# Patient Record
Sex: Female | Born: 1950 | Race: White | Hispanic: No | Marital: Married | Smoking: Never smoker
Health system: Southern US, Community
[De-identification: ages and names within clinical notes are randomized; demographics above are authoritative.]

## PROBLEM LIST (undated history)

## (undated) DIAGNOSIS — K589 Irritable bowel syndrome without diarrhea: Secondary | ICD-10-CM

## (undated) DIAGNOSIS — M5090 Cervical disc disorder, unspecified, unspecified cervical region: Secondary | ICD-10-CM

## (undated) DIAGNOSIS — M545 Low back pain, unspecified: Secondary | ICD-10-CM

## (undated) DIAGNOSIS — I10 Essential (primary) hypertension: Secondary | ICD-10-CM

## (undated) DIAGNOSIS — D1800 Hemangioma unspecified site: Secondary | ICD-10-CM

## (undated) DIAGNOSIS — G894 Chronic pain syndrome: Secondary | ICD-10-CM

## (undated) DIAGNOSIS — E7800 Pure hypercholesterolemia, unspecified: Secondary | ICD-10-CM

## (undated) DIAGNOSIS — G8929 Other chronic pain: Secondary | ICD-10-CM

## (undated) DIAGNOSIS — D490 Neoplasm of unspecified behavior of digestive system: Secondary | ICD-10-CM

## (undated) DIAGNOSIS — M810 Age-related osteoporosis without current pathological fracture: Secondary | ICD-10-CM

## (undated) DIAGNOSIS — B009 Herpesviral infection, unspecified: Secondary | ICD-10-CM

## (undated) DIAGNOSIS — K644 Residual hemorrhoidal skin tags: Secondary | ICD-10-CM

## (undated) DIAGNOSIS — R200 Anesthesia of skin: Secondary | ICD-10-CM

## (undated) DIAGNOSIS — J189 Pneumonia, unspecified organism: Secondary | ICD-10-CM

## (undated) DIAGNOSIS — K5792 Diverticulitis of intestine, part unspecified, without perforation or abscess without bleeding: Secondary | ICD-10-CM

## (undated) DIAGNOSIS — F419 Anxiety disorder, unspecified: Secondary | ICD-10-CM

## (undated) DIAGNOSIS — C801 Malignant (primary) neoplasm, unspecified: Secondary | ICD-10-CM

## (undated) DIAGNOSIS — M4716 Other spondylosis with myelopathy, lumbar region: Secondary | ICD-10-CM

## (undated) DIAGNOSIS — K219 Gastro-esophageal reflux disease without esophagitis: Secondary | ICD-10-CM

## (undated) DIAGNOSIS — K5900 Constipation, unspecified: Secondary | ICD-10-CM

## (undated) DIAGNOSIS — R202 Paresthesia of skin: Secondary | ICD-10-CM

## (undated) HISTORY — DX: Irritable bowel syndrome, unspecified: K58.9

## (undated) HISTORY — DX: Age-related osteoporosis without current pathological fracture: M81.0

## (undated) HISTORY — PX: OTHER SURGICAL HISTORY: SHX169

## (undated) HISTORY — DX: Pure hypercholesterolemia, unspecified: E78.00

## (undated) HISTORY — DX: Low back pain, unspecified: M54.50

## (undated) HISTORY — DX: Other spondylosis with myelopathy, lumbar region: M47.16

## (undated) HISTORY — DX: Hemangioma unspecified site: D18.00

## (undated) HISTORY — PX: TONSILLECTOMY: SUR1361

## (undated) HISTORY — DX: Constipation, unspecified: K59.00

## (undated) HISTORY — DX: Anesthesia of skin: R20.0

## (undated) HISTORY — DX: Chronic pain syndrome: G89.4

## (undated) HISTORY — DX: Gastro-esophageal reflux disease without esophagitis: K21.9

## (undated) HISTORY — PX: APPENDECTOMY: SHX54

## (undated) HISTORY — DX: Diverticulitis of intestine, part unspecified, without perforation or abscess without bleeding: K57.92

## (undated) HISTORY — DX: Other chronic pain: G89.29

## (undated) HISTORY — PX: KNEE SURGERY: SHX244

## (undated) HISTORY — DX: Neoplasm of unspecified behavior of digestive system: D49.0

## (undated) HISTORY — DX: Anesthesia of skin: R20.2

## (undated) HISTORY — DX: Cervical disc disorder, unspecified, unspecified cervical region: M50.90

## (undated) HISTORY — PX: CAROTID ENDARTERECTOMY: SUR193

## (undated) HISTORY — PX: CHOLECYSTECTOMY: SHX55

## (undated) HISTORY — DX: Residual hemorrhoidal skin tags: K64.4

## (undated) HISTORY — PX: TOTAL ABDOMINAL HYSTERECTOMY: SHX209

---

## 2000-06-09 ENCOUNTER — Encounter: Payer: Self-pay | Admitting: Family Medicine

## 2000-06-09 ENCOUNTER — Encounter: Admission: RE | Admit: 2000-06-09 | Discharge: 2000-06-09 | Payer: Self-pay | Admitting: Family Medicine

## 2001-04-28 ENCOUNTER — Encounter: Payer: Self-pay | Admitting: Family Medicine

## 2001-04-28 ENCOUNTER — Encounter: Admission: RE | Admit: 2001-04-28 | Discharge: 2001-04-28 | Payer: Self-pay | Admitting: Family Medicine

## 2001-06-01 ENCOUNTER — Encounter: Admission: RE | Admit: 2001-06-01 | Discharge: 2001-06-01 | Payer: Self-pay | Admitting: Family Medicine

## 2001-06-01 ENCOUNTER — Encounter: Payer: Self-pay | Admitting: Family Medicine

## 2002-04-15 ENCOUNTER — Encounter: Payer: Self-pay | Admitting: Cardiology

## 2002-04-15 ENCOUNTER — Ambulatory Visit (HOSPITAL_COMMUNITY): Admission: RE | Admit: 2002-04-15 | Discharge: 2002-04-15 | Payer: Self-pay | Admitting: Cardiology

## 2002-09-24 ENCOUNTER — Encounter: Payer: Self-pay | Admitting: Family Medicine

## 2002-09-24 ENCOUNTER — Encounter: Admission: RE | Admit: 2002-09-24 | Discharge: 2002-09-24 | Payer: Self-pay | Admitting: Family Medicine

## 2003-07-01 ENCOUNTER — Encounter (INDEPENDENT_AMBULATORY_CARE_PROVIDER_SITE_OTHER): Payer: Self-pay | Admitting: Specialist

## 2003-07-01 ENCOUNTER — Ambulatory Visit (HOSPITAL_BASED_OUTPATIENT_CLINIC_OR_DEPARTMENT_OTHER): Admission: RE | Admit: 2003-07-01 | Discharge: 2003-07-01 | Payer: Self-pay | Admitting: Orthopedic Surgery

## 2003-09-26 ENCOUNTER — Encounter: Admission: RE | Admit: 2003-09-26 | Discharge: 2003-09-26 | Payer: Self-pay | Admitting: Family Medicine

## 2003-09-26 ENCOUNTER — Encounter: Payer: Self-pay | Admitting: Family Medicine

## 2004-10-02 ENCOUNTER — Ambulatory Visit (HOSPITAL_COMMUNITY): Admission: RE | Admit: 2004-10-02 | Discharge: 2004-10-02 | Payer: Self-pay | Admitting: Family Medicine

## 2005-10-03 ENCOUNTER — Ambulatory Visit (HOSPITAL_COMMUNITY): Admission: RE | Admit: 2005-10-03 | Discharge: 2005-10-03 | Payer: Self-pay | Admitting: Family Medicine

## 2006-10-06 ENCOUNTER — Ambulatory Visit (HOSPITAL_COMMUNITY): Admission: RE | Admit: 2006-10-06 | Discharge: 2006-10-06 | Payer: Self-pay | Admitting: Family Medicine

## 2007-10-08 ENCOUNTER — Ambulatory Visit (HOSPITAL_COMMUNITY): Admission: RE | Admit: 2007-10-08 | Discharge: 2007-10-08 | Payer: Self-pay | Admitting: Family Medicine

## 2008-10-26 ENCOUNTER — Ambulatory Visit (HOSPITAL_COMMUNITY): Admission: RE | Admit: 2008-10-26 | Discharge: 2008-10-26 | Payer: Self-pay | Admitting: Family Medicine

## 2009-10-30 ENCOUNTER — Ambulatory Visit (HOSPITAL_COMMUNITY): Admission: RE | Admit: 2009-10-30 | Discharge: 2009-10-30 | Payer: Self-pay | Admitting: Family Medicine

## 2009-12-02 HISTORY — PX: COLONOSCOPY: SHX174

## 2010-08-14 DIAGNOSIS — D229 Melanocytic nevi, unspecified: Secondary | ICD-10-CM

## 2010-08-14 HISTORY — DX: Melanocytic nevi, unspecified: D22.9

## 2010-08-28 DIAGNOSIS — D229 Melanocytic nevi, unspecified: Secondary | ICD-10-CM

## 2010-08-28 HISTORY — DX: Melanocytic nevi, unspecified: D22.9

## 2010-10-31 ENCOUNTER — Ambulatory Visit (HOSPITAL_COMMUNITY)
Admission: RE | Admit: 2010-10-31 | Discharge: 2010-10-31 | Payer: Self-pay | Source: Home / Self Care | Admitting: Family Medicine

## 2010-12-23 ENCOUNTER — Encounter: Payer: Self-pay | Admitting: *Deleted

## 2011-01-24 ENCOUNTER — Other Ambulatory Visit: Payer: Self-pay | Admitting: Neurosurgery

## 2011-01-25 ENCOUNTER — Other Ambulatory Visit: Payer: Self-pay | Admitting: Neurosurgery

## 2011-01-25 DIAGNOSIS — M545 Low back pain, unspecified: Secondary | ICD-10-CM

## 2011-01-26 ENCOUNTER — Ambulatory Visit
Admission: RE | Admit: 2011-01-26 | Discharge: 2011-01-26 | Disposition: A | Discharge status: Home / Self Care | Payer: BC Managed Care – PPO | Source: Ambulatory Visit | Attending: Neurosurgery | Admitting: Neurosurgery

## 2011-01-26 DIAGNOSIS — M545 Low back pain, unspecified: Secondary | ICD-10-CM

## 2011-04-19 NOTE — Procedures (Signed)
[facility]  Patient:    BRYSTOL, WASILEWSKI Visit Number: [id number] MRN: [id number]          Service Type: OUT Location: RAD Attending Physician:  Cain Sieve Dictated by:   Rozell Searing, P.A. Proc. Date: 04/15/02 Admit Date:  04/15/2002   CC:         Selinda Flavin, M.D.   Stress Test  PROCEDURE:  Adenosine stress test.  CARDIOLOGIST:  Valera Castle, M.D.  INDICATIONS:  Ms. Rensch is a 60 year old female with no prior history of heart disease, referred by Dr. Selinda Flavin for pharmacologic stress testing for evaluation of the recent development of chest discomfort.  The patient reports a several-month history of intermittent, dull upper chest discomfort, not always associated with strenuous activity.  She reports being under considerable stress lately, and has had chest discomfort during these episodes.  She denies any associated radiation to the neck, jaw, or upper extremities, dyspnea, diaphoresis, or nausea.  Her episodes typically last for several hours before resolving.  The patient cardiac risks factors are notable for dyslipidemia, for which she is on medication, but otherwise negative for hypertension, diabetes mellitus, tobacco, or a family history of coronary artery disease.  PHYSICAL EXAMINATION  NECK:  Preserved bilateral carotid pulses without bruits.  HEART:  A regular rate and rhythm (S1 and S2), with a 1/6 short systolic ejection murmur along the LSB.  LUNGS:  Clear to auscultation in all fields.  EXTREMITIES:  Preserved distal pulses with no pedal edema.  NEUROLOGIC:  Alert and oriented.  DESCRIPTION OF PROCEDURE:  Adenosine was infused over four minutes, as per protocol.  Cardiolite injected at the three-minute mark.  The patient reported no chest discomfort during the stress test, but did note some mild discomfort during recovery, which eased spontaneously.  Serial electrocardiographic tracings are notable for:   The 1.5 mm horizontal ST depression in the inferolateral leads II, III, aVF, and V4-V6.  No atrioventricular block noted.  The heart rate rose from 83 baseline to 146 maximum.  The blood pressure rose from 140/80 baseline to 122/70 final.  CONCLUSION:  Clinically negative, electrocardiographically positive stress test, secondary to inferolateral ST changes.  Perfusion images are pending. Dictated by:   Rozell Searing, P.A. Attending Physician:  Cain Sieve DD:  04/15/02 TD:  04/17/02 Job: [id number]

## 2011-04-19 NOTE — Op Note (Signed)
NAME:  Morgan Rowland, Morgan Rowland                          ACCOUNT NO.:  [id number]   MEDICAL RECORD NO.:  [id number]                   PATIENT TYPE:  AMB   LOCATION:  DSC                                  FACILITY:  [facility]   PHYSICIAN:  Harvie Junior, M.D.                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   PRINCIPAL PROCEDURE:  Excision of Morton neuroma, interdigital between the 3-  4 interspace, right foot.   SURGEON:  Harvie Junior, M.D.   ASSISTANT:  Marshia Ly, P.A.   ANESTHESIA:  General.   BRIEF HISTORY:  She is a 60 year old female with a long history of having  had pain in the right foot.  She ultimately had undergone multiple injection  procedures because of the pain and they resolved the pain completely.  Because of continued complaints of pain in the 3-4 interval and failure of  conservative care, she was taken to the operating room for Morton's neuroma  excision.   PROCEDURE:  The patient was taken to the operating room after adequate  general anesthesia was obtained.  The patient was placed on the operating  table.  The right foot was prepped and draped in the usual sterile fashion.  Following this, the leg was exsanguinated and blood pressure tourniquet  inflated to 250 mmHg.  Following this, a linear incision was made in the 4-5  interspace.  The subcutaneous tissue was dissected down to the level of the  intercarpal ligament, which was identified.  A Freer elevator was placed on  top of the neurovascular bundle and the interdigital ligament was divided at  this point.  A large neuromatous nerve tangle was identified below this  ligament, and this was grasped, retracted proximally, and came predominately  underneath the third  metatarsal.  This was divided.  The small branch from  underneath the fourth metatarsal was identified and divided.  The digital  branches down into the third and fourth toe were clearly identified and  divided distally and the entire nerve tangle was excised.  This was sent to  pathology.   Following this, the wound was copiously irrigated, suctioned dry.  The  bleeding was controlled with electrocautery.  At this point, the skin was  closed with a 4-0 nylon running suture.  A sterile compressive dressing was  applied and the patient was taken to the recovery room where she was noted  to be in satisfactory condition.  Estimated blood loss for this procedure  was less than 10 cc.  Harvie Junior, M.D.    Ranae Plumber  D:  07/01/2003  T:  07/02/2003  Job:  [id number]

## 2011-10-29 ENCOUNTER — Other Ambulatory Visit (HOSPITAL_COMMUNITY): Payer: Self-pay | Admitting: Family Medicine

## 2011-10-29 DIAGNOSIS — Z1231 Encounter for screening mammogram for malignant neoplasm of breast: Secondary | ICD-10-CM

## 2011-12-04 ENCOUNTER — Ambulatory Visit (HOSPITAL_COMMUNITY): Payer: BC Managed Care – PPO

## 2011-12-17 ENCOUNTER — Ambulatory Visit (HOSPITAL_COMMUNITY)
Admission: RE | Admit: 2011-12-17 | Discharge: 2011-12-17 | Disposition: A | Discharge status: Home / Self Care | Payer: BC Managed Care – PPO | Source: Ambulatory Visit | Attending: Family Medicine | Admitting: Family Medicine

## 2011-12-17 DIAGNOSIS — Z1231 Encounter for screening mammogram for malignant neoplasm of breast: Secondary | ICD-10-CM

## 2012-03-02 ENCOUNTER — Encounter (INDEPENDENT_AMBULATORY_CARE_PROVIDER_SITE_OTHER): Payer: Self-pay | Admitting: Internal Medicine

## 2012-03-02 ENCOUNTER — Ambulatory Visit (INDEPENDENT_AMBULATORY_CARE_PROVIDER_SITE_OTHER): Payer: BC Managed Care – PPO | Admitting: Internal Medicine

## 2012-03-02 VITALS — BP 142/80 | HR 64 | Temp 98.6°F | Ht 61.0 in | Wt 161.3 lb

## 2012-03-02 DIAGNOSIS — E7800 Pure hypercholesterolemia, unspecified: Secondary | ICD-10-CM | POA: Insufficient documentation

## 2012-03-02 DIAGNOSIS — K5792 Diverticulitis of intestine, part unspecified, without perforation or abscess without bleeding: Secondary | ICD-10-CM

## 2012-03-02 DIAGNOSIS — K5732 Diverticulitis of large intestine without perforation or abscess without bleeding: Secondary | ICD-10-CM

## 2012-03-02 MED ORDER — DOXYCYCLINE HYCLATE 100 MG PO TABS: 100.0000 mg | ORAL_TABLET | Freq: Two times a day (BID) | ORAL | Status: AC

## 2012-03-02 NOTE — Progress Notes (Signed)
Subjective:     Patient ID: Morgan Rowland, female   DOB: 01-31-1951, 61 y.o.   MRN: [id number]  HPI  [name] is a 61 yr old female presenting today with c/o left lower quadrant pain. The started Saturday. No chills or fever associated with her symptoms. She tells me that she just feels bad.  07/16/2009: Focal thickening of several centimeter segment of th e proximal jejunum in the left upper quadrant , likely indicationg focal enteritis.  CT pelvis scattered sigmoid diverticula without evidence of acute diverticulitis. Appetite has been okay for the past few days.  Stools were liquid Saturday. Stools are formed now.  No rectal bleeding. She rates the pain at a 4/10.  Colonoscopy 05/14/2010: Small external hemorrhoids, otherwise normal colonoscopy. No diverticulosis. Review of Systems see hpi. Current Outpatient Prescriptions  Medication Sig Dispense Refill  . aspirin 81 MG tablet Take 81 mg by mouth daily.      Marland Kitchen atorvastatin (LIPITOR) 10 MG tablet Take 10 mg by mouth daily.      . calcium carbonate (OS-CAL) 600 MG TABS Take 600 mg by mouth 2 (two) times daily with a meal.      . LORazepam (ATIVAN) 2 MG tablet Take 2 mg by mouth at bedtime as needed and may repeat dose one time if needed.      . Multiple Vitamins-Minerals (MULTIVITAMIN WITH MINERALS) tablet Take 1 tablet by mouth daily.      . raloxifene (EVISTA) 60 MG tablet Take 60 mg by mouth daily.      Marland Kitchen doxycycline (VIBRA-TABS) 100 MG tablet Take 1 tablet (100 mg total) by mouth 2 (two) times daily.  28 tablet  0   Past Medical History  Diagnosis Date  . Diverticulitis   . High cholesterol    Past Surgical History  Procedure Date  . Total abdominal hysterectomy   . Cholecystectomy   . Tonsillectomy   . Spinal fusion x 2    History   Social History  . Marital Status: Married    Spouse Name: N/A    Number of Children: N/A  . Years of Education: N/A   Occupational History  . Not on file.   Social History Main Topics  . Smoking  status: Never Smoker   . Smokeless tobacco: Not on file  . Alcohol Use: No  . Drug Use: No  . Sexually Active: Not on file   Other Topics Concern  . Not on file   Social History Narrative  . No narrative on file   Family Status  Relation Status Death Age  . Mother Alive     good health  . Father Deceased     DM  . Sister Alive     good health  . Brother Alive     One has throat cancer. One in good health   Allergies  Allergen Reactions  . Sulfa Antibiotics        Objective:   Physical Exam Filed Vitals:   03/02/12 1609  Height: 5\' 1"  (1.549 m)  Weight: 161 lb 4.8 oz (73.165 kg)   Alert and oriented. Skin warm and dry. Oral mucosa is moist.   . Sclera anicteric, conjunctivae is pink. Thyroid not enlarged. No cervical lymphadenopathy. Lungs clear. Heart regular rate and rhythm.  Abdomen is soft. Bowel sounds are positive. No hepatomegaly. No abdominal masses felt.Tenderness left lower quadrant.  No edema to lower extremities. Patient is alert and oriented.      Assessment:  Probably diverticulitis given hx.      Plan:    Doxycyline 100mg  BId x 15 days (Patient request).  If symptoms worsen she is to go to the ED for further treatment.

## 2012-03-02 NOTE — Patient Instructions (Signed)
If symptoms worsen, go to the ED.

## 2012-08-25 ENCOUNTER — Other Ambulatory Visit (INDEPENDENT_AMBULATORY_CARE_PROVIDER_SITE_OTHER): Payer: Self-pay | Admitting: Internal Medicine

## 2012-08-25 NOTE — Telephone Encounter (Signed)
Patient will need office visit prior to next refill. 

## 2012-09-24 ENCOUNTER — Other Ambulatory Visit (HOSPITAL_COMMUNITY): Payer: Self-pay | Admitting: Family Medicine

## 2012-09-24 DIAGNOSIS — Z1231 Encounter for screening mammogram for malignant neoplasm of breast: Secondary | ICD-10-CM

## 2012-10-12 ENCOUNTER — Ambulatory Visit (HOSPITAL_COMMUNITY)
Admission: RE | Admit: 2012-10-12 | Discharge: 2012-10-12 | Disposition: A | Discharge status: Home / Self Care | Payer: BC Managed Care – PPO | Source: Ambulatory Visit | Attending: Family Medicine | Admitting: Family Medicine

## 2012-10-12 DIAGNOSIS — Z1231 Encounter for screening mammogram for malignant neoplasm of breast: Secondary | ICD-10-CM

## 2012-12-04 ENCOUNTER — Other Ambulatory Visit (HOSPITAL_COMMUNITY): Payer: Self-pay | Admitting: Family Medicine

## 2012-12-04 DIAGNOSIS — Z1231 Encounter for screening mammogram for malignant neoplasm of breast: Secondary | ICD-10-CM

## 2012-12-17 ENCOUNTER — Ambulatory Visit (HOSPITAL_COMMUNITY)
Admission: RE | Admit: 2012-12-17 | Discharge: 2012-12-17 | Disposition: A | Discharge status: Home / Self Care | Payer: BC Managed Care – PPO | Source: Ambulatory Visit | Attending: Family Medicine | Admitting: Family Medicine

## 2012-12-17 DIAGNOSIS — Z1231 Encounter for screening mammogram for malignant neoplasm of breast: Secondary | ICD-10-CM

## 2013-12-01 ENCOUNTER — Telehealth (INDEPENDENT_AMBULATORY_CARE_PROVIDER_SITE_OTHER): Payer: Self-pay | Admitting: *Deleted

## 2013-12-01 NOTE — Telephone Encounter (Signed)
Message left on her phone.  

## 2013-12-01 NOTE — Telephone Encounter (Signed)
I spoke with patient. She continues to be in extreme pain. She has been taking Doxycycline since Saturday and is not getting any better. I advised her to go to the ED. They can get a CT scan if needed without a prior authorization.

## 2013-12-01 NOTE — Telephone Encounter (Signed)
Advised if she was in extreme pain to go to the ED.

## 2013-12-01 NOTE — Telephone Encounter (Signed)
Morgan Rowland is having extreme abd pain and thinks she has another intestinal infection. Would like for Terri to please give her a call at [phone].

## 2013-12-14 ENCOUNTER — Other Ambulatory Visit (HOSPITAL_COMMUNITY): Payer: Self-pay | Admitting: Family Medicine

## 2013-12-14 DIAGNOSIS — Z1231 Encounter for screening mammogram for malignant neoplasm of breast: Secondary | ICD-10-CM

## 2013-12-22 ENCOUNTER — Ambulatory Visit (HOSPITAL_COMMUNITY)
Admission: RE | Admit: 2013-12-22 | Discharge: 2013-12-22 | Disposition: A | Discharge status: Home / Self Care | Payer: BC Managed Care – PPO | Source: Ambulatory Visit | Attending: Family Medicine | Admitting: Family Medicine

## 2013-12-22 DIAGNOSIS — Z1231 Encounter for screening mammogram for malignant neoplasm of breast: Secondary | ICD-10-CM | POA: Insufficient documentation

## 2014-07-20 ENCOUNTER — Encounter (INDEPENDENT_AMBULATORY_CARE_PROVIDER_SITE_OTHER): Payer: Self-pay | Admitting: Internal Medicine

## 2014-07-20 ENCOUNTER — Encounter (INDEPENDENT_AMBULATORY_CARE_PROVIDER_SITE_OTHER): Payer: Self-pay | Admitting: *Deleted

## 2014-07-20 ENCOUNTER — Ambulatory Visit (INDEPENDENT_AMBULATORY_CARE_PROVIDER_SITE_OTHER): Payer: Medicare Other | Admitting: Internal Medicine

## 2014-07-20 VITALS — BP 154/70 | HR 80 | Temp 98.4°F | Ht 61.0 in | Wt 144.6 lb

## 2014-07-20 DIAGNOSIS — G8929 Other chronic pain: Secondary | ICD-10-CM

## 2014-07-20 DIAGNOSIS — R1031 Right lower quadrant pain: Secondary | ICD-10-CM

## 2014-07-20 MED ORDER — DICYCLOMINE HCL 10 MG PO CAPS: ORAL_CAPSULE | ORAL | Status: DC

## 2014-07-20 MED ORDER — DICYCLOMINE HCL 10 MG PO CAPS: 10.0000 mg | ORAL_CAPSULE | Freq: Three times a day (TID) | ORAL | Status: DC

## 2014-07-20 MED ORDER — DOXYCYCLINE HYCLATE 50 MG PO CAPS: 100.0000 mg | ORAL_CAPSULE | Freq: Two times a day (BID) | ORAL | Status: DC

## 2014-07-20 NOTE — Progress Notes (Signed)
Subjective:     Patient ID: Morgan Rowland, female   DOB: 06-Nov-1951, 63 y.o.   MRN: [id number]  HPILast seen in 2013. Present today with c/o rt abdominal pain radiating across her abdomen. She has had the pain x 3 days. She rates the pain at a 4/10. She has not tried anything to relieve the pain. She will have the pain when she has a BM . There has been no fever.  She does have acid reflux which for the most part controlled with Prilosec. Appetite is good. She has lost 15 pounds since her last visit in 2013. Intentional.  She usually has a BM every couple of days. This week she has had a BM daily. No melena or BRRB.     07/16/2009: CT abdomen/pelvis with CML Focal thickening of several centimeter segment of th e proximal jejunum in the left upper quadrant , likely indicationg focal enteritis. CT pelvis scattered sigmoid diverticula without evidence of acute diverticulitisColonoscopy 05/14/2010: Small external hemorrhoids, otherwise normal colonoscopy. No diverticulosis.   Review of Systems Past Medical History  Diagnosis Date  . Diverticulitis   . High cholesterol     Past Surgical History  Procedure Laterality Date  . Total abdominal hysterectomy    . Cholecystectomy    . Tonsillectomy    . Spinal fusion x 2    . Appendectomy      Allergies  Allergen Reactions  . Sulfa Antibiotics     Current Outpatient Prescriptions on File Prior to Visit  Medication Sig Dispense Refill  . aspirin 81 MG tablet Take 81 mg by mouth daily.      Marland Kitchen atorvastatin (LIPITOR) 10 MG tablet Take 10 mg by mouth daily.      . calcium carbonate (OS-CAL) 600 MG TABS Take 600 mg by mouth 2 (two) times daily with a meal.      . LORazepam (ATIVAN) 2 MG tablet Take 2 mg by mouth at bedtime as needed and may repeat dose one time if needed.      . Multiple Vitamins-Minerals (MULTIVITAMIN WITH MINERALS) tablet Take 1 tablet by mouth daily.      . raloxifene (EVISTA) 60 MG tablet Take 60 mg by mouth daily.       No  current facility-administered medications on file prior to visit.        Objective:   Physical Exam  Filed Vitals:   07/20/14 1537  BP: 154/70  Pulse: 80  Temp: 98.4 F (36.9 C)  Height: 5\' 1"  (1.549 m)  Weight: 144 lb 9.6 oz (65.59 kg)   Alert and oriented. Skin warm and dry. Oral mucosa is moist.   . Sclera anicteric, conjunctivae is pink. Thyroid not enlarged. No cervical lymphadenopathy. Lungs clear. Heart regular rate and rhythm.  Abdomen is soft. Bowel sounds are positive. No hepatomegaly. No abdominal masses felt. Tenderness rt mid abdomen.  No edema to lower extremities.       Assessment:     Rt abdominal pain radiating across abdomen.  ? Diverticulitis.       Plan:    CT abdomen/pelvis with CM.  Rx for Dicyclomine sent to her pharmacy.  Doxycycline 100mg  BID x 14 days.

## 2014-07-20 NOTE — Patient Instructions (Addendum)
CT abdomen/pelvis with CM.  Rx for Doxycycline 100mg  BID.x 14 days.

## 2014-07-21 LAB — CREATININE, SERUM: Creat: 0.7 mg/dL (ref 0.50–1.10)

## 2014-07-22 ENCOUNTER — Ambulatory Visit (HOSPITAL_COMMUNITY)
Admission: RE | Admit: 2014-07-22 | Discharge: 2014-07-22 | Disposition: A | Discharge status: Home / Self Care | Payer: Medicare Other | Source: Ambulatory Visit | Attending: Internal Medicine | Admitting: Internal Medicine

## 2014-07-22 DIAGNOSIS — R1031 Right lower quadrant pain: Secondary | ICD-10-CM | POA: Diagnosis present

## 2014-07-22 DIAGNOSIS — Z9089 Acquired absence of other organs: Secondary | ICD-10-CM | POA: Insufficient documentation

## 2014-07-22 DIAGNOSIS — Z90710 Acquired absence of both cervix and uterus: Secondary | ICD-10-CM | POA: Insufficient documentation

## 2014-07-22 DIAGNOSIS — G8929 Other chronic pain: Secondary | ICD-10-CM

## 2014-07-22 MED ORDER — IOHEXOL 300 MG/ML  SOLN
100.0000 mL | Freq: Once | INTRAMUSCULAR | Status: AC | PRN
  Administered 2014-07-22: 100 mL via INTRAVENOUS

## 2015-04-04 ENCOUNTER — Other Ambulatory Visit: Payer: Self-pay | Admitting: Neurosurgery

## 2015-04-04 DIAGNOSIS — M47816 Spondylosis without myelopathy or radiculopathy, lumbar region: Secondary | ICD-10-CM

## 2015-04-06 ENCOUNTER — Ambulatory Visit
Admission: RE | Admit: 2015-04-06 | Discharge: 2015-04-06 | Disposition: A | Discharge status: Home / Self Care | Payer: Medicare Other | Source: Ambulatory Visit | Attending: Neurosurgery | Admitting: Neurosurgery

## 2015-04-06 DIAGNOSIS — M47816 Spondylosis without myelopathy or radiculopathy, lumbar region: Secondary | ICD-10-CM

## 2016-02-12 DIAGNOSIS — M79671 Pain in right foot: Secondary | ICD-10-CM | POA: Diagnosis not present

## 2016-02-12 DIAGNOSIS — M7661 Achilles tendinitis, right leg: Secondary | ICD-10-CM | POA: Diagnosis not present

## 2016-02-22 DIAGNOSIS — M5430 Sciatica, unspecified side: Secondary | ICD-10-CM | POA: Diagnosis not present

## 2016-02-22 DIAGNOSIS — M4716 Other spondylosis with myelopathy, lumbar region: Secondary | ICD-10-CM | POA: Diagnosis not present

## 2016-03-07 DIAGNOSIS — Z1231 Encounter for screening mammogram for malignant neoplasm of breast: Secondary | ICD-10-CM | POA: Diagnosis not present

## 2016-04-17 ENCOUNTER — Ambulatory Visit (INDEPENDENT_AMBULATORY_CARE_PROVIDER_SITE_OTHER): Payer: Medicare Other | Admitting: Internal Medicine

## 2016-04-17 ENCOUNTER — Encounter (INDEPENDENT_AMBULATORY_CARE_PROVIDER_SITE_OTHER): Payer: Self-pay | Admitting: Internal Medicine

## 2016-04-17 VITALS — BP 132/58 | HR 72 | Temp 98.7°F | Ht 61.0 in | Wt 150.5 lb

## 2016-04-17 DIAGNOSIS — K5909 Other constipation: Secondary | ICD-10-CM

## 2016-04-17 NOTE — Patient Instructions (Signed)
Rx for Amitiza. OV in one year.

## 2016-04-17 NOTE — Progress Notes (Signed)
   Subjective:    Patient ID: Morgan Rowland, female    DOB: [birth year], 65 y.o.   MRN: [id number]  HPI  Presents today with c/o constipation. She says when she does have a BM, it is very painful. She says she has rectal cramps all day long. Hx of constipation for years.  She says stools softeners do not help. She has tried Personnel officer which at first helped, but does not now. Her appetite is good. No weight loss. She has tried Miralax which does not help.     s. CT pelvis scattered sigmoid diverticula without evidence of acute diverticulitis     Colonoscopy 05/14/2010: Small external hemorrhoids, otherwise normal colonoscopy. No diverticulosis. Review of Systems Past Medical History  Diagnosis Date  . Diverticulitis   . High cholesterol     Past Surgical History  Procedure Laterality Date  . Total abdominal hysterectomy    . Cholecystectomy    . Tonsillectomy    . Spinal fusion x 2    . Appendectomy      Allergies  Allergen Reactions  . Sulfa Antibiotics     Current Outpatient Prescriptions on File Prior to Visit  Medication Sig Dispense Refill  . aspirin 81 MG tablet Take 81 mg by mouth daily.    Marland Kitchen atorvastatin (LIPITOR) 10 MG tablet Take 10 mg by mouth daily.    . calcium carbonate (OS-CAL) 600 MG TABS Take 600 mg by mouth 2 (two) times daily with a meal.    . dicyclomine (BENTYL) 10 MG capsule Take 1 capsule (10 mg total) by mouth 4 (four) times daily -  before meals and at bedtime. (Patient taking differently: Take 10 mg by mouth. ) 90 capsule 0  . HYDROcodone-acetaminophen (NORCO) 10-325 MG per tablet Take 1 tablet by mouth every 6 (six) hours as needed.    Marland Kitchen LORazepam (ATIVAN) 2 MG tablet Take 2 mg by mouth at bedtime as needed and may repeat dose one time if needed.    . Multiple Vitamins-Minerals (MULTIVITAMIN WITH MINERALS) tablet Take 1 tablet by mouth daily.    Marland Kitchen omeprazole (PRILOSEC) 20 MG capsule Take 20 mg by mouth daily.    . raloxifene (EVISTA) 60 MG tablet  Take 60 mg by mouth daily.    Marland Kitchen dicyclomine (BENTYL) 10 MG capsule TAKE ONE CAPSULE BY MOUTH TWICE DAILY BEFORE BREAKFAST AND LUNCH (Patient not taking: Reported on 04/17/2016) 60 capsule 5  . doxycycline (VIBRAMYCIN) 50 MG capsule Take 2 capsules (100 mg total) by mouth 2 (two) times daily. (Patient not taking: Reported on 04/17/2016) 28 capsule 0   No current facility-administered medications on file prior to visit.        Objective:   Physical Exam Blood pressure 132/58, pulse 72, temperature 98.7 F (37.1 C), height 5\' 1"  (1.549 m), weight 150 lb 8 oz (68.266 kg). Alert and oriented. Skin warm and dry. Oral mucosa is moist.   . Sclera anicteric, conjunctivae is pink. Thyroid not enlarged. No cervical lymphadenopathy. Lungs clear. Heart regular rate and rhythm.  Abdomen is soft. Bowel sounds are positive. No hepatomegaly. No abdominal masses felt. No tenderness.  No edema to lower extremities.         Assessment & Plan:  Constipation. Am going to try her on Amitiza x 3 boxes OV in 1 year

## 2016-06-10 DIAGNOSIS — H524 Presbyopia: Secondary | ICD-10-CM | POA: Diagnosis not present

## 2016-06-21 DIAGNOSIS — L570 Actinic keratosis: Secondary | ICD-10-CM | POA: Diagnosis not present

## 2016-06-21 DIAGNOSIS — D239 Other benign neoplasm of skin, unspecified: Secondary | ICD-10-CM | POA: Diagnosis not present

## 2016-07-02 DIAGNOSIS — M4716 Other spondylosis with myelopathy, lumbar region: Secondary | ICD-10-CM | POA: Diagnosis not present

## 2016-07-08 DIAGNOSIS — E7800 Pure hypercholesterolemia, unspecified: Secondary | ICD-10-CM | POA: Diagnosis not present

## 2016-07-08 DIAGNOSIS — M1990 Unspecified osteoarthritis, unspecified site: Secondary | ICD-10-CM | POA: Diagnosis not present

## 2016-07-15 DIAGNOSIS — M1990 Unspecified osteoarthritis, unspecified site: Secondary | ICD-10-CM | POA: Diagnosis not present

## 2016-07-15 DIAGNOSIS — M545 Low back pain: Secondary | ICD-10-CM | POA: Diagnosis not present

## 2016-07-15 DIAGNOSIS — E7800 Pure hypercholesterolemia, unspecified: Secondary | ICD-10-CM | POA: Diagnosis not present

## 2016-07-15 DIAGNOSIS — Z6828 Body mass index (BMI) 28.0-28.9, adult: Secondary | ICD-10-CM | POA: Diagnosis not present

## 2016-07-15 DIAGNOSIS — F419 Anxiety disorder, unspecified: Secondary | ICD-10-CM | POA: Diagnosis not present

## 2016-07-15 DIAGNOSIS — G4700 Insomnia, unspecified: Secondary | ICD-10-CM | POA: Diagnosis not present

## 2016-07-15 DIAGNOSIS — Z0000 Encounter for general adult medical examination without abnormal findings: Secondary | ICD-10-CM | POA: Diagnosis not present

## 2016-08-20 DIAGNOSIS — M25562 Pain in left knee: Secondary | ICD-10-CM | POA: Diagnosis not present

## 2016-10-02 DIAGNOSIS — L570 Actinic keratosis: Secondary | ICD-10-CM | POA: Diagnosis not present

## 2016-11-05 DIAGNOSIS — M859 Disorder of bone density and structure, unspecified: Secondary | ICD-10-CM | POA: Diagnosis not present

## 2016-11-05 DIAGNOSIS — M47816 Spondylosis without myelopathy or radiculopathy, lumbar region: Secondary | ICD-10-CM | POA: Diagnosis not present

## 2016-11-05 DIAGNOSIS — M4716 Other spondylosis with myelopathy, lumbar region: Secondary | ICD-10-CM | POA: Diagnosis not present

## 2016-11-07 DIAGNOSIS — L570 Actinic keratosis: Secondary | ICD-10-CM | POA: Diagnosis not present

## 2016-11-07 DIAGNOSIS — B009 Herpesviral infection, unspecified: Secondary | ICD-10-CM | POA: Diagnosis not present

## 2016-11-18 DIAGNOSIS — M25561 Pain in right knee: Secondary | ICD-10-CM | POA: Diagnosis not present

## 2016-11-18 DIAGNOSIS — S83207A Unspecified tear of unspecified meniscus, current injury, left knee, initial encounter: Secondary | ICD-10-CM | POA: Diagnosis not present

## 2016-11-28 DIAGNOSIS — M25562 Pain in left knee: Secondary | ICD-10-CM | POA: Diagnosis not present

## 2016-12-09 DIAGNOSIS — M25562 Pain in left knee: Secondary | ICD-10-CM | POA: Diagnosis not present

## 2016-12-09 DIAGNOSIS — Q686 Discoid meniscus: Secondary | ICD-10-CM | POA: Diagnosis not present

## 2016-12-25 DIAGNOSIS — M6752 Plica syndrome, left knee: Secondary | ICD-10-CM | POA: Diagnosis not present

## 2016-12-25 DIAGNOSIS — Q686 Discoid meniscus: Secondary | ICD-10-CM | POA: Diagnosis not present

## 2016-12-25 DIAGNOSIS — S83281A Other tear of lateral meniscus, current injury, right knee, initial encounter: Secondary | ICD-10-CM | POA: Diagnosis not present

## 2016-12-25 DIAGNOSIS — M6751 Plica syndrome, right knee: Secondary | ICD-10-CM | POA: Diagnosis not present

## 2016-12-25 DIAGNOSIS — G8918 Other acute postprocedural pain: Secondary | ICD-10-CM | POA: Diagnosis not present

## 2017-01-02 DIAGNOSIS — M25662 Stiffness of left knee, not elsewhere classified: Secondary | ICD-10-CM | POA: Diagnosis not present

## 2017-01-02 DIAGNOSIS — M25562 Pain in left knee: Secondary | ICD-10-CM | POA: Diagnosis not present

## 2017-01-10 DIAGNOSIS — M25562 Pain in left knee: Secondary | ICD-10-CM | POA: Diagnosis not present

## 2017-01-10 DIAGNOSIS — M25662 Stiffness of left knee, not elsewhere classified: Secondary | ICD-10-CM | POA: Diagnosis not present

## 2017-01-16 DIAGNOSIS — M1990 Unspecified osteoarthritis, unspecified site: Secondary | ICD-10-CM | POA: Diagnosis not present

## 2017-01-16 DIAGNOSIS — F419 Anxiety disorder, unspecified: Secondary | ICD-10-CM | POA: Diagnosis not present

## 2017-01-16 DIAGNOSIS — Z6830 Body mass index (BMI) 30.0-30.9, adult: Secondary | ICD-10-CM | POA: Diagnosis not present

## 2017-01-17 DIAGNOSIS — M25662 Stiffness of left knee, not elsewhere classified: Secondary | ICD-10-CM | POA: Diagnosis not present

## 2017-01-17 DIAGNOSIS — M25562 Pain in left knee: Secondary | ICD-10-CM | POA: Diagnosis not present

## 2017-01-30 DIAGNOSIS — M25562 Pain in left knee: Secondary | ICD-10-CM | POA: Diagnosis not present

## 2017-01-30 DIAGNOSIS — M25662 Stiffness of left knee, not elsewhere classified: Secondary | ICD-10-CM | POA: Diagnosis not present

## 2017-02-04 DIAGNOSIS — M25562 Pain in left knee: Secondary | ICD-10-CM | POA: Diagnosis not present

## 2017-02-05 DIAGNOSIS — M4716 Other spondylosis with myelopathy, lumbar region: Secondary | ICD-10-CM | POA: Diagnosis not present

## 2017-02-25 DIAGNOSIS — M25562 Pain in left knee: Secondary | ICD-10-CM | POA: Diagnosis not present

## 2017-03-05 DIAGNOSIS — M25562 Pain in left knee: Secondary | ICD-10-CM | POA: Diagnosis not present

## 2017-03-11 DIAGNOSIS — M94262 Chondromalacia, left knee: Secondary | ICD-10-CM | POA: Diagnosis not present

## 2017-03-11 DIAGNOSIS — M25562 Pain in left knee: Secondary | ICD-10-CM | POA: Diagnosis not present

## 2017-03-31 DIAGNOSIS — Z1231 Encounter for screening mammogram for malignant neoplasm of breast: Secondary | ICD-10-CM | POA: Diagnosis not present

## 2017-04-03 ENCOUNTER — Encounter (INDEPENDENT_AMBULATORY_CARE_PROVIDER_SITE_OTHER): Payer: Self-pay | Admitting: Internal Medicine

## 2017-04-17 ENCOUNTER — Ambulatory Visit (INDEPENDENT_AMBULATORY_CARE_PROVIDER_SITE_OTHER): Payer: Medicare Other | Admitting: Internal Medicine

## 2017-04-21 ENCOUNTER — Encounter (INDEPENDENT_AMBULATORY_CARE_PROVIDER_SITE_OTHER): Payer: Self-pay

## 2017-04-21 ENCOUNTER — Ambulatory Visit (INDEPENDENT_AMBULATORY_CARE_PROVIDER_SITE_OTHER): Payer: Medicare Other | Admitting: Internal Medicine

## 2017-04-21 ENCOUNTER — Encounter (INDEPENDENT_AMBULATORY_CARE_PROVIDER_SITE_OTHER): Payer: Self-pay | Admitting: Internal Medicine

## 2017-04-21 DIAGNOSIS — K5909 Other constipation: Secondary | ICD-10-CM

## 2017-04-21 DIAGNOSIS — K5900 Constipation, unspecified: Secondary | ICD-10-CM

## 2017-04-21 HISTORY — DX: Constipation, unspecified: K59.00

## 2017-04-21 NOTE — Progress Notes (Signed)
   Subjective:    Patient ID: Morgan Rowland, female    DOB: 1951/02/12, 66 y.o.   MRN: [id number]  HPI Here today for f/u. She was last seen in May of 2017. Hx constipation.  She presents with c/o constipation. She has tried Amitiza which did not help. She takes stool softeners and prunelax which help. Most of the time she goes every other day.  Her appetite is good. No weight loss. Her stools are hard until she takes something.   Colonoscopy 05/14/2010: Small external hemorrhoids, otherwise normal colonoscopy. No diverticulosis.    Review of Systems Past Medical History:  Diagnosis Date  . Constipation 04/21/2017  . Diverticulitis   . High cholesterol     Past Surgical History:  Procedure Laterality Date  . APPENDECTOMY    . CHOLECYSTECTOMY    . Spinal fusion x 2    . TONSILLECTOMY    . TOTAL ABDOMINAL HYSTERECTOMY      Allergies  Allergen Reactions  . Sulfa Antibiotics     Current Outpatient Prescriptions on File Prior to Visit  Medication Sig Dispense Refill  . aspirin 81 MG tablet Take 81 mg by mouth daily.    Marland Kitchen atorvastatin (LIPITOR) 10 MG tablet Take 10 mg by mouth daily.    Marland Kitchen HYDROcodone-acetaminophen (NORCO) 10-325 MG per tablet Take 1 tablet by mouth every 6 (six) hours as needed.    Marland Kitchen LORazepam (ATIVAN) 2 MG tablet Take 2 mg by mouth at bedtime as needed and may repeat dose one time if needed.    . Multiple Vitamins-Minerals (MULTIVITAMIN WITH MINERALS) tablet Take 1 tablet by mouth daily.    . raloxifene (EVISTA) 60 MG tablet Take 60 mg by mouth daily.     No current facility-administered medications on file prior to visit.          Objective:   Physical Exam Blood pressure (!) 148/72, pulse 68, temperature 98.2 F (36.8 C), height 5\' 1"  (1.549 m), weight 158 lb 8 oz (71.9 kg).  Alert and oriented. Skin warm and dry. Oral mucosa is moist.   . Sclera anicteric, conjunctivae is pink. Thyroid not enlarged. No cervical lymphadenopathy. Lungs clear. Heart  regular rate and rhythm.  Abdomen is soft. Bowel sounds are positive. No hepatomegaly. No abdominal masses felt. No tenderness.  No edema to lower extremities.         Assessment & Plan:  Constipation. Am going to try her on Linzess and see how she does. She will let me know if this works and I will call her an Rx in.

## 2017-04-21 NOTE — Patient Instructions (Signed)
Samples of Linzess.  OV in 1 year. Needs to give me a progress report.

## 2017-06-05 DIAGNOSIS — Z6830 Body mass index (BMI) 30.0-30.9, adult: Secondary | ICD-10-CM | POA: Diagnosis not present

## 2017-06-05 DIAGNOSIS — I10 Essential (primary) hypertension: Secondary | ICD-10-CM | POA: Diagnosis not present

## 2017-06-20 ENCOUNTER — Telehealth (INDEPENDENT_AMBULATORY_CARE_PROVIDER_SITE_OTHER): Payer: Self-pay | Admitting: Internal Medicine

## 2017-06-20 MED ORDER — LINACLOTIDE 290 MCG PO CAPS
290.0000 ug | ORAL_CAPSULE | Freq: Every day | ORAL | 5 refills | Status: DC
Start: 2017-06-20 — End: 2018-01-06

## 2017-06-20 NOTE — Telephone Encounter (Signed)
rx for Linzess sent to her pharmacy

## 2017-07-17 DIAGNOSIS — I10 Essential (primary) hypertension: Secondary | ICD-10-CM | POA: Diagnosis not present

## 2017-07-17 DIAGNOSIS — F419 Anxiety disorder, unspecified: Secondary | ICD-10-CM | POA: Diagnosis not present

## 2017-07-17 DIAGNOSIS — E7800 Pure hypercholesterolemia, unspecified: Secondary | ICD-10-CM | POA: Diagnosis not present

## 2017-07-17 DIAGNOSIS — Z780 Asymptomatic menopausal state: Secondary | ICD-10-CM | POA: Diagnosis not present

## 2017-07-22 DIAGNOSIS — E7800 Pure hypercholesterolemia, unspecified: Secondary | ICD-10-CM | POA: Diagnosis not present

## 2017-07-22 DIAGNOSIS — Z0000 Encounter for general adult medical examination without abnormal findings: Secondary | ICD-10-CM | POA: Diagnosis not present

## 2017-07-22 DIAGNOSIS — M1990 Unspecified osteoarthritis, unspecified site: Secondary | ICD-10-CM | POA: Diagnosis not present

## 2017-07-22 DIAGNOSIS — F419 Anxiety disorder, unspecified: Secondary | ICD-10-CM | POA: Diagnosis not present

## 2017-07-28 DIAGNOSIS — G5702 Lesion of sciatic nerve, left lower limb: Secondary | ICD-10-CM | POA: Diagnosis not present

## 2017-07-28 DIAGNOSIS — M25562 Pain in left knee: Secondary | ICD-10-CM | POA: Diagnosis not present

## 2017-07-28 DIAGNOSIS — S83282A Other tear of lateral meniscus, current injury, left knee, initial encounter: Secondary | ICD-10-CM | POA: Diagnosis not present

## 2017-08-14 ENCOUNTER — Ambulatory Visit (INDEPENDENT_AMBULATORY_CARE_PROVIDER_SITE_OTHER): Payer: Self-pay | Admitting: Neurology

## 2017-08-14 ENCOUNTER — Ambulatory Visit (INDEPENDENT_AMBULATORY_CARE_PROVIDER_SITE_OTHER): Payer: Medicare Other | Admitting: Neurology

## 2017-08-14 DIAGNOSIS — Z0289 Encounter for other administrative examinations: Secondary | ICD-10-CM

## 2017-08-14 DIAGNOSIS — M25562 Pain in left knee: Secondary | ICD-10-CM | POA: Diagnosis not present

## 2017-08-14 DIAGNOSIS — M25569 Pain in unspecified knee: Principal | ICD-10-CM

## 2017-08-14 DIAGNOSIS — G8929 Other chronic pain: Secondary | ICD-10-CM

## 2017-08-17 NOTE — Procedures (Signed)
        Full Name: Magda Casso Gender: Female MRN #: [id number] Date of Birth: 2051/01/23    Visit Date: 08/14/17 10:25 Age: 66 Years 3 Months Old Examining Physician: Sarina Ill, MD  Referring Physician: Dr. Dorna Leitz  History: Left knee pain  Summary: All nerves and muscles (as detailed below) normal.     Conclusion: This is a normal study  Cc: Dr. Berenice Primas.  Sarina Ill, M.D.  [facility] [place], [place] [address] Tel: [phone] Fax: [phone]        [facility]    Nerve / Sites Muscle Latency Ref. Amplitude Ref. Rel Amp Segments Distance Velocity Ref. Area    ms ms mV mV %  cm m/s m/s mVms  L Peroneal - EDB     Ankle EDB 4.9 ?6.5 3.6 ?2.0 100 Ankle - EDB 9   13.8     Fib head EDB 9.8  3.3  90.2 Fib head - Ankle 25 51 ?44 13.4     Pop fossa EDB 11.7  3.2  97.5 Pop fossa - Fib head 10 52 ?44 13.1         Pop fossa - Ankle      L Tibial - AH     Ankle AH 5.4 ?5.8 5.5 ?4.0 100 Ankle - AH 9   13.7     Pop fossa AH 11.7  4.8  86.1 Pop fossa - Ankle 28 45 ?41 16.4         SNC    Nerve / Sites Rec. Site Peak Lat Ref.  Amp Ref. Segments Distance    ms ms V V  cm  L Sural - Ankle (Calf)     Calf Ankle 3.4 ?4.4 8 ?6 Calf - Ankle 14  L Superficial peroneal - Ankle     Lat leg Ankle 3.2 ?4.4 6 ?6 Lat leg - Ankle 14         F  Wave    Nerve F Lat Ref.   ms ms  L Tibial - AH 47.0 ?56.0       EMG full       EMG Summary Table    Spontaneous MUAP Recruitment  Muscle IA Fib PSW Fasc Other Amp Dur. Poly Pattern  L. Vastus medialis Normal None None None _______ Normal Normal Normal Normal  L. Peroneus longus Normal None None None _______ Normal Normal Normal Normal  L. Tibialis anterior Normal None None None _______ Normal Normal Normal Normal  L. Gastrocnemius (Medial head) Normal None None None _______ Normal Normal Normal Normal  L. Extensor hallucis longus Normal None None None _______ Normal Normal Normal Normal

## 2017-08-17 NOTE — Progress Notes (Signed)
        Full Name: Morgan Rowland Gender: Female MRN #: [id number] Date of Birth: 11-04-2051    Visit Date: 08/14/17 10:25 Age: 66 Years 3 Months Old Examining Physician: Sarina Ill, MD  Referring Physician: Dr. Dorna Leitz  History: Left knee pain  Summary: All nerves and muscles (as detailed below) normal.     Conclusion: This is a normal study  Sarina Ill, M.D.  [facility] [place], [place] [address] Tel: [phone] Fax: [phone]        [facility]    Nerve / Sites Muscle Latency Ref. Amplitude Ref. Rel Amp Segments Distance Velocity Ref. Area    ms ms mV mV %  cm m/s m/s mVms  L Peroneal - EDB     Ankle EDB 4.9 ?6.5 3.6 ?2.0 100 Ankle - EDB 9   13.8     Fib head EDB 9.8  3.3  90.2 Fib head - Ankle 25 51 ?44 13.4     Pop fossa EDB 11.7  3.2  97.5 Pop fossa - Fib head 10 52 ?44 13.1         Pop fossa - Ankle      L Tibial - AH     Ankle AH 5.4 ?5.8 5.5 ?4.0 100 Ankle - AH 9   13.7     Pop fossa AH 11.7  4.8  86.1 Pop fossa - Ankle 28 45 ?41 16.4         SNC    Nerve / Sites Rec. Site Peak Lat Ref.  Amp Ref. Segments Distance    ms ms V V  cm  L Sural - Ankle (Calf)     Calf Ankle 3.4 ?4.4 8 ?6 Calf - Ankle 14  L Superficial peroneal - Ankle     Lat leg Ankle 3.2 ?4.4 6 ?6 Lat leg - Ankle 14         F  Wave    Nerve F Lat Ref.   ms ms  L Tibial - AH 47.0 ?56.0       EMG full       EMG Summary Table    Spontaneous MUAP Recruitment  Muscle IA Fib PSW Fasc Other Amp Dur. Poly Pattern  L. Vastus medialis Normal None None None _______ Normal Normal Normal Normal  L. Peroneus longus Normal None None None _______ Normal Normal Normal Normal  L. Tibialis anterior Normal None None None _______ Normal Normal Normal Normal  L. Gastrocnemius (Medial head) Normal None None None _______ Normal Normal Normal Normal  L. Extensor hallucis longus Normal None None None _______ Normal Normal Normal Normal

## 2017-08-17 NOTE — Progress Notes (Signed)
See procedure note.

## 2017-08-19 ENCOUNTER — Telehealth: Payer: Self-pay | Admitting: Neurology

## 2017-08-19 NOTE — Telephone Encounter (Signed)
Faxed [facility]/EMG report to Dr. Dorna Leitz [phone]

## 2017-08-19 NOTE — Telephone Encounter (Signed)
Received fax confirmation

## 2017-08-19 NOTE — Telephone Encounter (Signed)
Pt has called requesting that the results of her NCS be forwarded to Dr Berenice Primas as soon as possible.  Pt is not asking for a call back

## 2017-08-25 DIAGNOSIS — R300 Dysuria: Secondary | ICD-10-CM | POA: Diagnosis not present

## 2017-08-27 DIAGNOSIS — G8918 Other acute postprocedural pain: Secondary | ICD-10-CM | POA: Diagnosis not present

## 2017-08-27 DIAGNOSIS — M6752 Plica syndrome, left knee: Secondary | ICD-10-CM | POA: Diagnosis not present

## 2017-08-27 DIAGNOSIS — M23242 Derangement of anterior horn of lateral meniscus due to old tear or injury, left knee: Secondary | ICD-10-CM | POA: Diagnosis not present

## 2017-09-04 DIAGNOSIS — M23242 Derangement of anterior horn of lateral meniscus due to old tear or injury, left knee: Secondary | ICD-10-CM | POA: Diagnosis not present

## 2017-09-04 DIAGNOSIS — M25662 Stiffness of left knee, not elsewhere classified: Secondary | ICD-10-CM | POA: Diagnosis not present

## 2017-09-04 DIAGNOSIS — M25562 Pain in left knee: Secondary | ICD-10-CM | POA: Diagnosis not present

## 2017-09-12 DIAGNOSIS — M25562 Pain in left knee: Secondary | ICD-10-CM | POA: Diagnosis not present

## 2017-09-12 DIAGNOSIS — M23242 Derangement of anterior horn of lateral meniscus due to old tear or injury, left knee: Secondary | ICD-10-CM | POA: Diagnosis not present

## 2017-09-12 DIAGNOSIS — M25662 Stiffness of left knee, not elsewhere classified: Secondary | ICD-10-CM | POA: Diagnosis not present

## 2017-09-18 DIAGNOSIS — M25662 Stiffness of left knee, not elsewhere classified: Secondary | ICD-10-CM | POA: Diagnosis not present

## 2017-09-18 DIAGNOSIS — M25562 Pain in left knee: Secondary | ICD-10-CM | POA: Diagnosis not present

## 2017-09-18 DIAGNOSIS — M23242 Derangement of anterior horn of lateral meniscus due to old tear or injury, left knee: Secondary | ICD-10-CM | POA: Diagnosis not present

## 2017-09-23 DIAGNOSIS — M23242 Derangement of anterior horn of lateral meniscus due to old tear or injury, left knee: Secondary | ICD-10-CM | POA: Diagnosis not present

## 2017-10-04 DIAGNOSIS — Z23 Encounter for immunization: Secondary | ICD-10-CM | POA: Diagnosis not present

## 2017-10-14 DIAGNOSIS — M4716 Other spondylosis with myelopathy, lumbar region: Secondary | ICD-10-CM | POA: Diagnosis not present

## 2017-10-15 DIAGNOSIS — Z6831 Body mass index (BMI) 31.0-31.9, adult: Secondary | ICD-10-CM | POA: Diagnosis not present

## 2017-10-15 DIAGNOSIS — R300 Dysuria: Secondary | ICD-10-CM | POA: Diagnosis not present

## 2017-10-22 DIAGNOSIS — N3001 Acute cystitis with hematuria: Secondary | ICD-10-CM | POA: Diagnosis not present

## 2017-10-22 DIAGNOSIS — R300 Dysuria: Secondary | ICD-10-CM | POA: Diagnosis not present

## 2017-10-22 DIAGNOSIS — Z6832 Body mass index (BMI) 32.0-32.9, adult: Secondary | ICD-10-CM | POA: Diagnosis not present

## 2017-10-22 DIAGNOSIS — Z6831 Body mass index (BMI) 31.0-31.9, adult: Secondary | ICD-10-CM | POA: Diagnosis not present

## 2017-12-26 DIAGNOSIS — J0100 Acute maxillary sinusitis, unspecified: Secondary | ICD-10-CM | POA: Diagnosis not present

## 2017-12-26 DIAGNOSIS — Z6830 Body mass index (BMI) 30.0-30.9, adult: Secondary | ICD-10-CM | POA: Diagnosis not present

## 2017-12-26 DIAGNOSIS — R05 Cough: Secondary | ICD-10-CM | POA: Diagnosis not present

## 2017-12-30 DIAGNOSIS — R05 Cough: Secondary | ICD-10-CM | POA: Diagnosis not present

## 2017-12-30 DIAGNOSIS — J0100 Acute maxillary sinusitis, unspecified: Secondary | ICD-10-CM | POA: Diagnosis not present

## 2017-12-30 DIAGNOSIS — F419 Anxiety disorder, unspecified: Secondary | ICD-10-CM | POA: Diagnosis not present

## 2017-12-30 DIAGNOSIS — I10 Essential (primary) hypertension: Secondary | ICD-10-CM | POA: Diagnosis not present

## 2018-01-01 DIAGNOSIS — R05 Cough: Secondary | ICD-10-CM | POA: Diagnosis not present

## 2018-01-01 DIAGNOSIS — K219 Gastro-esophageal reflux disease without esophagitis: Secondary | ICD-10-CM | POA: Diagnosis not present

## 2018-01-01 DIAGNOSIS — T368X5A Adverse effect of other systemic antibiotics, initial encounter: Secondary | ICD-10-CM | POA: Diagnosis not present

## 2018-01-01 DIAGNOSIS — K521 Toxic gastroenteritis and colitis: Secondary | ICD-10-CM | POA: Diagnosis not present

## 2018-01-01 DIAGNOSIS — R110 Nausea: Secondary | ICD-10-CM | POA: Diagnosis not present

## 2018-01-01 DIAGNOSIS — Z7982 Long term (current) use of aspirin: Secondary | ICD-10-CM | POA: Diagnosis not present

## 2018-01-01 DIAGNOSIS — Z79899 Other long term (current) drug therapy: Secondary | ICD-10-CM | POA: Diagnosis not present

## 2018-01-06 ENCOUNTER — Encounter (INDEPENDENT_AMBULATORY_CARE_PROVIDER_SITE_OTHER): Payer: Self-pay | Admitting: Internal Medicine

## 2018-01-06 ENCOUNTER — Ambulatory Visit (INDEPENDENT_AMBULATORY_CARE_PROVIDER_SITE_OTHER): Payer: Medicare Other | Admitting: Internal Medicine

## 2018-01-06 VITALS — BP 158/80 | HR 72 | Temp 98.2°F | Ht 61.0 in | Wt 163.0 lb

## 2018-01-06 DIAGNOSIS — R197 Diarrhea, unspecified: Secondary | ICD-10-CM | POA: Diagnosis not present

## 2018-01-06 NOTE — Progress Notes (Signed)
   Subjective:    Patient ID: Morgan Rowland, female    DOB: 05-05-1951, 67 y.o.   MRN: [id number]  HPI Here today for f/u. Last seen in May of 2018. Hx of chronic constipation. Maintained on Linzess.  She tells me today she saw her PCP about 1 1/2 weeks ago for a sinus infection. She was started on Zithromax and given an injection of Rocephin.  She says every since she has had diarrhea.  She was seen at [facility] a week ago for weakness. She was given a liter of fluid and discharged. She say her C-diff was negative by Dr. Nadara Mustard. She had 7 stools yesterday. On average she was having 6-8 stools a day. There was no fever. She has some abdominal tenderness. She has been on a BRAT  Diet. She has been very weak, but does feel a little better today.  No nausea or vomiting.   Colonoscopy 05/14/2010:Small external hemorrhoids, otherwise normal colonoscopy. No diverticulosis.   Review of Systems Past Medical History:  Diagnosis Date  . Constipation 04/21/2017  . Diverticulitis   . High cholesterol     Past Surgical History:  Procedure Laterality Date  . APPENDECTOMY    . CHOLECYSTECTOMY    . Spinal fusion x 2    . TONSILLECTOMY    . TOTAL ABDOMINAL HYSTERECTOMY      Allergies  Allergen Reactions  . Sulfa Antibiotics     itch    Current Outpatient Medications on File Prior to Visit  Medication Sig Dispense Refill  . aspirin 81 MG tablet Take 81 mg by mouth daily.    Marland Kitchen atorvastatin (LIPITOR) 10 MG tablet Take 10 mg by mouth daily.    Marland Kitchen HYDROcodone-acetaminophen (NORCO) 10-325 MG per tablet Take 1 tablet by mouth every 6 (six) hours as needed.    Marland Kitchen lisinopril (PRINIVIL,ZESTRIL) 20 MG tablet Take 20 mg by mouth daily.    Marland Kitchen LORazepam (ATIVAN) 2 MG tablet Take 2 mg by mouth at bedtime as needed and may repeat dose one time if needed.    . Multiple Vitamins-Minerals (MULTIVITAMIN WITH MINERALS) tablet Take 1 tablet by mouth daily.    . raloxifene (EVISTA) 60 MG tablet Take 60 mg by mouth daily.     . ranitidine (ZANTAC) 150 MG tablet Take 150 mg by mouth as needed.      No current facility-administered medications on file prior to visit.         Objective:   Physical Exam Blood pressure (!) 158/80, pulse 72, temperature 98.2 F (36.8 C), height 5\' 1"  (1.549 m), weight 163 lb (73.9 kg). Alert and oriented. Skin warm and dry. Oral mucosa is moist.   . Sclera anicteric, conjunctivae is pink. Thyroid not enlarged. No cervical lymphadenopathy. Lungs clear. Heart regular rate and rhythm.  Abdomen is soft. Bowel sounds are positive. No hepatomegaly. No abdominal masses felt. No tenderness.  No edema to lower extremities.           Assessment & Plan:  Probably gastroenteritis. Will get a GI pathogen today. Push fluids.

## 2018-01-06 NOTE — Patient Instructions (Signed)
GI pathogen. Further recommendations to follow.

## 2018-01-20 DIAGNOSIS — M47816 Spondylosis without myelopathy or radiculopathy, lumbar region: Secondary | ICD-10-CM | POA: Diagnosis not present

## 2018-01-20 DIAGNOSIS — M4716 Other spondylosis with myelopathy, lumbar region: Secondary | ICD-10-CM | POA: Diagnosis not present

## 2018-02-16 DIAGNOSIS — M722 Plantar fascial fibromatosis: Secondary | ICD-10-CM | POA: Diagnosis not present

## 2018-02-24 DIAGNOSIS — M4716 Other spondylosis with myelopathy, lumbar region: Secondary | ICD-10-CM | POA: Diagnosis not present

## 2018-02-25 DIAGNOSIS — L821 Other seborrheic keratosis: Secondary | ICD-10-CM | POA: Diagnosis not present

## 2018-02-25 DIAGNOSIS — D229 Melanocytic nevi, unspecified: Secondary | ICD-10-CM | POA: Diagnosis not present

## 2018-02-25 DIAGNOSIS — B009 Herpesviral infection, unspecified: Secondary | ICD-10-CM | POA: Diagnosis not present

## 2018-04-13 DIAGNOSIS — Z1231 Encounter for screening mammogram for malignant neoplasm of breast: Secondary | ICD-10-CM | POA: Diagnosis not present

## 2018-04-21 ENCOUNTER — Ambulatory Visit (INDEPENDENT_AMBULATORY_CARE_PROVIDER_SITE_OTHER): Payer: Medicare Other | Admitting: Internal Medicine

## 2018-04-21 ENCOUNTER — Encounter (INDEPENDENT_AMBULATORY_CARE_PROVIDER_SITE_OTHER): Payer: Self-pay | Admitting: Internal Medicine

## 2018-04-21 VITALS — BP 160/78 | HR 68 | Temp 98.1°F | Ht 61.0 in | Wt 164.1 lb

## 2018-04-21 DIAGNOSIS — R197 Diarrhea, unspecified: Secondary | ICD-10-CM | POA: Diagnosis not present

## 2018-04-21 NOTE — Progress Notes (Signed)
   Subjective:    Patient ID: Morgan Rowland, female    DOB: 09-26-1951, 67 y.o.   MRN: [id number]  HPI Here today for f/u.  She has lower abdominal pain after a BM. She is having a BM x 4. Stools are formed. Her appetite is good. No weight loss. No melena or BRRB. She is exercising by gardening and working in the yard.  She remains active. Has not had diarrhea since she was treated for a sinus infection in February of this year.    Colonoscopy 05/14/2010:Small external hemorrhoids, otherwise normal colonoscopy. No diverticulosis.    Review of Systems Past Medical History:  Diagnosis Date  . Constipation 04/21/2017  . Diverticulitis   . High cholesterol     Past Surgical History:  Procedure Laterality Date  . APPENDECTOMY    . CHOLECYSTECTOMY    . Spinal fusion x 2    . TONSILLECTOMY    . TOTAL ABDOMINAL HYSTERECTOMY      Allergies  Allergen Reactions  . Sulfa Antibiotics     itch    Current Outpatient Medications on File Prior to Visit  Medication Sig Dispense Refill  . aspirin 81 MG tablet Take 81 mg by mouth daily.    Marland Kitchen atorvastatin (LIPITOR) 10 MG tablet Take 10 mg by mouth daily.    Marland Kitchen HYDROcodone-acetaminophen (NORCO) 10-325 MG per tablet Take 1 tablet by mouth every 6 (six) hours as needed.    Marland Kitchen lisinopril (PRINIVIL,ZESTRIL) 20 MG tablet Take 20 mg by mouth daily.    Marland Kitchen LORazepam (ATIVAN) 2 MG tablet Take 2 mg by mouth at bedtime as needed and may repeat dose one time if needed.    . Multiple Vitamins-Minerals (MULTIVITAMIN WITH MINERALS) tablet Take 1 tablet by mouth daily.    . raloxifene (EVISTA) 60 MG tablet Take 60 mg by mouth daily.    . ranitidine (ZANTAC) 150 MG tablet Take 150 mg by mouth as needed.      No current facility-administered medications on file prior to visit.         Objective:   Physical Exam Blood pressure (!) 160/78, pulse 68, temperature 98.1 F (36.7 C), height 5\' 1"  (1.549 m), weight 164 lb 1.6 oz (74.4 kg). Alert and oriented.  Skin warm and dry. Oral mucosa is moist.   . Sclera anicteric, conjunctivae is pink. Thyroid not enlarged. No cervical lymphadenopathy. Lungs clear. Heart regular rate and rhythm.  Abdomen is soft. Bowel sounds are positive. No hepatomegaly. No abdominal masses felt. No tenderness.  No edema to lower extremities.           Assessment & Plan:  Diarrhea.  Diarrhea has resolved. May try a stool softener with her BM. Try a Probiotic. OV in 1 year.

## 2018-04-21 NOTE — Patient Instructions (Signed)
OV in 1 year.  

## 2018-05-18 DIAGNOSIS — M4802 Spinal stenosis, cervical region: Secondary | ICD-10-CM | POA: Diagnosis not present

## 2018-05-18 DIAGNOSIS — M4716 Other spondylosis with myelopathy, lumbar region: Secondary | ICD-10-CM | POA: Diagnosis not present

## 2018-05-18 DIAGNOSIS — M5412 Radiculopathy, cervical region: Secondary | ICD-10-CM | POA: Diagnosis not present

## 2018-06-08 DIAGNOSIS — M50222 Other cervical disc displacement at C5-C6 level: Secondary | ICD-10-CM | POA: Diagnosis not present

## 2018-06-12 DIAGNOSIS — R937 Abnormal findings on diagnostic imaging of other parts of musculoskeletal system: Secondary | ICD-10-CM | POA: Diagnosis not present

## 2018-06-16 DIAGNOSIS — Z6831 Body mass index (BMI) 31.0-31.9, adult: Secondary | ICD-10-CM | POA: Diagnosis not present

## 2018-06-16 DIAGNOSIS — N3001 Acute cystitis with hematuria: Secondary | ICD-10-CM | POA: Diagnosis not present

## 2018-07-07 DIAGNOSIS — M25572 Pain in left ankle and joints of left foot: Secondary | ICD-10-CM | POA: Diagnosis not present

## 2018-07-13 DIAGNOSIS — R937 Abnormal findings on diagnostic imaging of other parts of musculoskeletal system: Secondary | ICD-10-CM | POA: Diagnosis not present

## 2018-07-17 DIAGNOSIS — R937 Abnormal findings on diagnostic imaging of other parts of musculoskeletal system: Secondary | ICD-10-CM | POA: Diagnosis not present

## 2018-07-31 DIAGNOSIS — E7801 Familial hypercholesterolemia: Secondary | ICD-10-CM | POA: Diagnosis not present

## 2018-07-31 DIAGNOSIS — I10 Essential (primary) hypertension: Secondary | ICD-10-CM | POA: Diagnosis not present

## 2018-07-31 DIAGNOSIS — F419 Anxiety disorder, unspecified: Secondary | ICD-10-CM | POA: Diagnosis not present

## 2018-07-31 DIAGNOSIS — M1990 Unspecified osteoarthritis, unspecified site: Secondary | ICD-10-CM | POA: Diagnosis not present

## 2018-08-05 DIAGNOSIS — M545 Low back pain: Secondary | ICD-10-CM | POA: Diagnosis not present

## 2018-08-05 DIAGNOSIS — G4700 Insomnia, unspecified: Secondary | ICD-10-CM | POA: Diagnosis not present

## 2018-08-05 DIAGNOSIS — Z780 Asymptomatic menopausal state: Secondary | ICD-10-CM | POA: Diagnosis not present

## 2018-08-05 DIAGNOSIS — E7801 Familial hypercholesterolemia: Secondary | ICD-10-CM | POA: Diagnosis not present

## 2018-08-18 DIAGNOSIS — M4722 Other spondylosis with radiculopathy, cervical region: Secondary | ICD-10-CM | POA: Diagnosis not present

## 2018-08-18 DIAGNOSIS — R937 Abnormal findings on diagnostic imaging of other parts of musculoskeletal system: Secondary | ICD-10-CM | POA: Diagnosis not present

## 2018-08-25 DIAGNOSIS — M8588 Other specified disorders of bone density and structure, other site: Secondary | ICD-10-CM | POA: Diagnosis not present

## 2018-09-03 DIAGNOSIS — H5211 Myopia, right eye: Secondary | ICD-10-CM | POA: Diagnosis not present

## 2018-09-30 DIAGNOSIS — Z23 Encounter for immunization: Secondary | ICD-10-CM | POA: Diagnosis not present

## 2018-10-07 ENCOUNTER — Other Ambulatory Visit: Payer: Self-pay | Admitting: Podiatry

## 2018-10-07 ENCOUNTER — Ambulatory Visit (INDEPENDENT_AMBULATORY_CARE_PROVIDER_SITE_OTHER): Payer: Medicare Other

## 2018-10-07 ENCOUNTER — Ambulatory Visit: Payer: Medicare Other | Admitting: Podiatry

## 2018-10-07 ENCOUNTER — Encounter: Payer: Self-pay | Admitting: Podiatry

## 2018-10-07 VITALS — BP 150/86 | HR 89

## 2018-10-07 DIAGNOSIS — M79672 Pain in left foot: Secondary | ICD-10-CM | POA: Diagnosis not present

## 2018-10-07 DIAGNOSIS — M779 Enthesopathy, unspecified: Secondary | ICD-10-CM

## 2018-10-07 DIAGNOSIS — M76822 Posterior tibial tendinitis, left leg: Secondary | ICD-10-CM

## 2018-10-07 MED ORDER — TRIAMCINOLONE ACETONIDE 10 MG/ML IJ SUSP
10.0000 mg | Freq: Once | INTRAMUSCULAR | Status: AC
  Administered 2018-10-07: 10 mg

## 2018-10-11 NOTE — Progress Notes (Signed)
Subjective:   Patient ID: Morgan Rowland, female   DOB: 67 y.o.   MRN: [id number]   HPI Patient states she is getting a lot of pain in her left arch that is been hurting for several weeks and she just noted a knot on the side of the foot a week ago that is been tender.  Patient states the arch is quite sore when she tries to walk and does not remember any other injury that may have occurred.  Patient does not smoke and likes to be active   Review of Systems  All other systems reviewed and are negative.       Objective:  Physical Exam  Constitutional: She appears well-developed and well-nourished.  Cardiovascular: Intact distal pulses.  Pulmonary/Chest: Effort normal.  Musculoskeletal: Normal range of motion.  Neurological: She is alert.  Skin: Skin is warm.  Nursing note and vitals reviewed.   Neurovascular status found to be intact muscle strength is adequate range of motion within normal limits.  Patient is found to have moderate depression of the arch left with inflammation pain of the posterior tibial tendon as it comes underneath the medial malleolus and inserts into the navicular with no indication of tendon dysfunction.  Patient has good digital perfusion and is well oriented x3     Assessment:  Acute posterior tibial tendinitis left with no indication currently of tendon dysfunction     Plan:  H&P condition reviewed education rendered and x-ray reviewed.  Today I did a careful sheath injection left 3 mg Kenalog 5 mg Xylocaine applied fascial brace to lift the arch up and advised on reduced activity and supportive shoe usage.  Reappoint in the next 2 weeks or earlier if needed  X-ray indicates that there is moderate depression of the arch with no indications of other bone pathology

## 2018-10-21 ENCOUNTER — Ambulatory Visit: Payer: Medicare Other | Admitting: Podiatry

## 2018-12-09 DIAGNOSIS — R937 Abnormal findings on diagnostic imaging of other parts of musculoskeletal system: Secondary | ICD-10-CM | POA: Diagnosis not present

## 2018-12-14 ENCOUNTER — Other Ambulatory Visit (HOSPITAL_COMMUNITY): Payer: Self-pay | Admitting: Neurosurgery

## 2018-12-14 ENCOUNTER — Other Ambulatory Visit: Payer: Self-pay | Admitting: Neurosurgery

## 2018-12-14 DIAGNOSIS — R937 Abnormal findings on diagnostic imaging of other parts of musculoskeletal system: Secondary | ICD-10-CM

## 2018-12-17 ENCOUNTER — Ambulatory Visit (HOSPITAL_COMMUNITY)
Admission: RE | Admit: 2018-12-17 | Discharge: 2018-12-17 | Disposition: A | Discharge status: Home / Self Care | Payer: Medicare Other | Source: Ambulatory Visit | Attending: Neurosurgery | Admitting: Neurosurgery

## 2018-12-17 DIAGNOSIS — M4802 Spinal stenosis, cervical region: Secondary | ICD-10-CM | POA: Diagnosis not present

## 2018-12-17 DIAGNOSIS — R937 Abnormal findings on diagnostic imaging of other parts of musculoskeletal system: Secondary | ICD-10-CM | POA: Insufficient documentation

## 2018-12-17 LAB — POCT I-STAT CREATININE: Creatinine, Ser: 0.8 mg/dL (ref 0.44–1.00)

## 2018-12-17 MED ORDER — GADOBUTROL 1 MMOL/ML IV SOLN
7.0000 mL | Freq: Once | INTRAVENOUS | Status: AC | PRN
  Administered 2018-12-17: 7 mL via INTRAVENOUS

## 2019-01-06 DIAGNOSIS — R937 Abnormal findings on diagnostic imaging of other parts of musculoskeletal system: Secondary | ICD-10-CM | POA: Diagnosis not present

## 2019-01-06 DIAGNOSIS — M5412 Radiculopathy, cervical region: Secondary | ICD-10-CM | POA: Diagnosis not present

## 2019-01-29 DIAGNOSIS — K589 Irritable bowel syndrome without diarrhea: Secondary | ICD-10-CM | POA: Diagnosis not present

## 2019-01-29 DIAGNOSIS — Z6832 Body mass index (BMI) 32.0-32.9, adult: Secondary | ICD-10-CM | POA: Diagnosis not present

## 2019-01-29 DIAGNOSIS — M545 Low back pain: Secondary | ICD-10-CM | POA: Diagnosis not present

## 2019-01-29 DIAGNOSIS — G4700 Insomnia, unspecified: Secondary | ICD-10-CM | POA: Diagnosis not present

## 2019-02-03 DIAGNOSIS — Z0120 Encounter for dental examination and cleaning without abnormal findings: Secondary | ICD-10-CM | POA: Diagnosis not present

## 2019-02-10 ENCOUNTER — Telehealth: Payer: Self-pay | Admitting: Gastroenterology

## 2019-02-10 NOTE — Telephone Encounter (Signed)
DOD 01-29-19 pm Dr. Loletha Carrow    We received a referral for the patient. Would like to transfer care. Patient states that she is not getting anywhere with her current GI doctor. Sending records for review to DOD. Dr. Loletha Carrow please advise for scheduling.  Thank You,

## 2019-02-11 ENCOUNTER — Telehealth: Payer: Self-pay | Admitting: Gastroenterology

## 2019-02-11 NOTE — Telephone Encounter (Signed)
Due to Dr. Loletha Carrow schedule he currently cannot accommodate transfers. Patient is coming from [place] office and feels like she is not getting nowhere. Would like to be seen here in our office.  Records have been resent for review please advise for scheduling.

## 2019-04-13 NOTE — Telephone Encounter (Signed)
Records are in referral folder.

## 2019-04-13 NOTE — Telephone Encounter (Signed)
Dr. Havery Moros accepted Morgan Rowland. I called Morgan Rowland to offer a telephone visit but Morgan Rowland prefers to wait, she will call back to schedule.

## 2019-04-15 DIAGNOSIS — G5601 Carpal tunnel syndrome, right upper limb: Secondary | ICD-10-CM | POA: Diagnosis not present

## 2019-04-15 DIAGNOSIS — M1712 Unilateral primary osteoarthritis, left knee: Secondary | ICD-10-CM | POA: Diagnosis not present

## 2019-04-27 ENCOUNTER — Ambulatory Visit (INDEPENDENT_AMBULATORY_CARE_PROVIDER_SITE_OTHER): Payer: Medicare Other | Admitting: Internal Medicine

## 2019-05-04 DIAGNOSIS — H9313 Tinnitus, bilateral: Secondary | ICD-10-CM | POA: Diagnosis not present

## 2019-05-04 DIAGNOSIS — H9202 Otalgia, left ear: Secondary | ICD-10-CM | POA: Diagnosis not present

## 2019-05-17 DIAGNOSIS — G894 Chronic pain syndrome: Secondary | ICD-10-CM | POA: Diagnosis not present

## 2019-05-17 DIAGNOSIS — M4716 Other spondylosis with myelopathy, lumbar region: Secondary | ICD-10-CM | POA: Diagnosis not present

## 2019-05-17 DIAGNOSIS — M129 Arthropathy, unspecified: Secondary | ICD-10-CM | POA: Diagnosis not present

## 2019-05-17 DIAGNOSIS — Z79899 Other long term (current) drug therapy: Secondary | ICD-10-CM | POA: Diagnosis not present

## 2019-05-25 ENCOUNTER — Telehealth: Payer: Self-pay

## 2019-05-25 NOTE — Telephone Encounter (Signed)
Covid-19 screening questions   Do you now or have you had a fever in the last 14 days? No  Do you have any respiratory symptoms of shortness of breath or cough now or in the last 14 days? No  Do you have any family members or close contacts with diagnosed or suspected Covid-19 in the past 14 days? No  Have you been tested for Covid-19 and found to be positive? No        

## 2019-05-26 ENCOUNTER — Ambulatory Visit: Payer: Medicare Other | Admitting: Nurse Practitioner

## 2019-05-26 ENCOUNTER — Encounter (INDEPENDENT_AMBULATORY_CARE_PROVIDER_SITE_OTHER): Payer: Self-pay

## 2019-05-26 ENCOUNTER — Encounter: Payer: Self-pay | Admitting: Nurse Practitioner

## 2019-05-26 VITALS — BP 132/80 | HR 85 | Temp 98.7°F | Ht 61.0 in | Wt 174.2 lb

## 2019-05-26 DIAGNOSIS — R1032 Left lower quadrant pain: Secondary | ICD-10-CM

## 2019-05-26 DIAGNOSIS — K5792 Diverticulitis of intestine, part unspecified, without perforation or abscess without bleeding: Secondary | ICD-10-CM | POA: Diagnosis not present

## 2019-05-26 MED ORDER — DOXYCYCLINE MONOHYDRATE 100 MG PO TABS: 100.0000 mg | ORAL_TABLET | Freq: Two times a day (BID) | ORAL | 0 refills | Status: AC

## 2019-05-26 MED ORDER — GLYCOPYRROLATE 2 MG PO TABS: ORAL_TABLET | ORAL | 1 refills | Status: DC

## 2019-05-26 NOTE — Progress Notes (Signed)
Agree with assessment and plan as outlined.  

## 2019-05-26 NOTE — Progress Notes (Addendum)
ASSESSMENT / PLAN:   32.  68 year old female transferring care from [facility] gastroenterology.  She has chronic LLQ pain following defecation and lasting all day. Told by other providers that she probably has IBS.  Generally abdominal pain relieved with defecation in patients with IBS though no other etiology of pain has been determined and the pain has been present for years.  Musculoskeletal source? -Patient thinks she may have responded to Bentyl but it has been a long time and she cannot remember for sure.  I am going to try her on Robinul Forte 1 twice daily as needed -Patient can follow-up with [place] in a few weeks if not improving.  Hopefully by that time we will have her colonoscopy report from [facility]  2.  Diverticulitis.  She usually responds to doxycycline.  Over the weekend patient developed recurrent LLQ pain distant diverticulitis.  Patient started herself on Cleocin which she had a supply of at home.  Some improvement in pain.  -Stop Cleocin.  We discussed black box warning for C. Difficile infection.  Should she develop diarrhea patient will call our office to submit a stool study -Give her a short course of doxycycline 100 mg twice daily for 7 days -If diverticulitis type pain does not resolve or certainly if it gets worse then patient will possibly need CT scan.   3. Colon cancer screening.  Sounds like she had a normal colonoscopy 8 or 9 years ago [place].  Will request report.   4. Chronic back pain requiring opioids  Records received from [facility].  Colonoscopy [place] 2011- Dr. Laural Golden for evaluation of reported recurrent diverticulitis. Exam was somewhat difficult to able to be completed. Prep was satisfactory. Hemorrhoids found, no polyps and NO diverticula. Will place on colon recall list for [place] 2021.    HPI:    Chief Complaint:   Abdominal pain  Patient is a 68 year old female with a history of hyperlipidemia, osteoporosis, diverticulitis  and GERD.  She is s/p cholecystectomy, hysterectomy and appendectomy . Previously followed by [facility], wanted to transfer care to our practice, Dr. Havery Moros has accepted her as a patient.   Patient gives a history of chronic constipation most likely due to pain medications for her back.  She has  associated LLQ cramping following each bowel movement.  In November 2019 patient took a course of clindamycin for a tooth infection.  Since then she has not had any further problems with constipation.  Her bowels are soft and of normal frequency but the LLQ pain following each bowel movement has not resolved Episodes of pain generally last all day.  Patient has been told she probably has IBS.  A long time ago she was tried on Bentyl, thinks it may have helped but cannot remember.  In addition to above patient has a history of diverticulitis, documented on CTscan in the 1990's she recalls. She is intolerant to Cipro and Flagyl, treated with doxycycline with resolution of pain.  A couple of years ago she had the same LLQ pain which again resolved with doxycycline.  She does recall having had a CT scan at the time with no apparent findings of diverticulitis but she still got better with antibiotics.  Over the weekend patient developed recurrent burning in the LLQ.  As usual this is associated with malaise.  No fevers.  Patient realized she had a refill on Cleocin and started it over  the weekend with some improvement in symptoms.  She has also noticed that her stool frequency has increased but not having diarrhea.  No urinary symptoms.  No blood in bowel movements. No other GI complaints.    Past Medical History:  Diagnosis Date  . Cervical disc disease   . Constipation 04/21/2017  . Diverticulitis   . External hemorrhoids   . GERD (gastroesophageal reflux disease)   . High cholesterol   . Hypercholesteremia   . IBS (irritable bowel syndrome)   . Osteoporosis   . Parotid tumor       Past Surgical History:  Procedure Laterality Date  . APPENDECTOMY    . CHOLECYSTECTOMY    . COLONOSCOPY  2011   Dr Laural Golden in [place]  . Spinal fusion x 2    . TONSILLECTOMY    . TOTAL ABDOMINAL HYSTERECTOMY     Family History  Problem Relation Age of Onset  . Throat cancer Brother   . Colon cancer Neg Hx   . Esophageal cancer Neg Hx    Social History   Tobacco Use  . Smoking status: Never Smoker  . Smokeless tobacco: Never Used  Substance Use Topics  . Alcohol use: No  . Drug use: No   Current Outpatient Medications  Medication Sig Dispense Refill  . aluminum-magnesium hydroxide-simethicone (MAALOX) 161-096-04 MG/5ML SUSP Take by mouth as needed (1 tablespoon).    Marland Kitchen aspirin 81 MG tablet Take 81 mg by mouth daily.    Marland Kitchen atorvastatin (LIPITOR) 10 MG tablet Take 10 mg by mouth daily.    . clindamycin (CLEOCIN) 150 MG capsule Take 300 mg by mouth every 8 (eight) hours.    Marland Kitchen HYDROcodone-acetaminophen (NORCO) 10-325 MG per tablet Take 1 tablet by mouth every 6 (six) hours as needed.    Marland Kitchen lisinopril (PRINIVIL,ZESTRIL) 20 MG tablet Take 20 mg by mouth daily.    Marland Kitchen LORazepam (ATIVAN) 2 MG tablet Take 2 mg by mouth at bedtime as needed and may repeat dose one time if needed.    . Multiple Vitamins-Minerals (MULTIVITAMIN WITH MINERALS) tablet Take 1 tablet by mouth daily.    . Omeprazole (PRILOSEC PO) Take 1 tablet by mouth daily.    . raloxifene (EVISTA) 60 MG tablet Take 60 mg by mouth daily.    . valACYclovir (VALTREX) 500 MG tablet Take 500 mg by mouth every other day.    Marland Kitchen NARCAN 4 MG/0.1ML LIQD nasal spray kit USE 1 SPRAY AS NEEDED FOR ACCIDENTAL OVERDOSE     No current facility-administered medications for this visit.    Allergies  Allergen Reactions  . Sulfa Antibiotics     itch     Review of Systems: Positive for anxiety, arthritis, back pain and heart murmur . All other systems reviewed and negative except where noted in HPI.     Physical Exam:    Wt Readings  from Last 3 Encounters:  05/26/19 174 lb 4 oz (79 kg)  04/21/18 164 lb 1.6 oz (74.4 kg)  01/06/18 163 lb (73.9 kg)    BP 132/80   Pulse 85   Temp 98.7 F (37.1 C)   Ht 5' 1"  (1.549 m)   Wt 174 lb 4 oz (79 kg)   BMI 32.92 kg/m  Constitutional:  Pleasant female in no acute distress. Psychiatric: Normal mood and affect. Behavior is normal. EENT: Pupils normal.  Conjunctivae are normal. No scleral icterus. Neck supple.  Cardiovascular: Normal rate, regular rhythm. No edema Pulmonary/chest: Effort normal and breath  sounds normal. No wheezing, rales or rhonchi. Abdominal: Soft, nondistended, nontender. Bowel sounds active throughout. There are no masses palpable. No hepatomegaly. Neurological: Alert and oriented to person place and time. Skin: Skin is warm and dry. No rashes noted.  Tye Savoy, NP  05/26/2019, 10:56 AM

## 2019-05-26 NOTE — Patient Instructions (Signed)
If you are age 70 or older, your body mass index should be between 23-30. Your Body mass index is 32.92 kg/m. If this is out of the aforementioned range listed, please consider follow up with your Primary Care Provider.  If you are age 70 or younger, your body mass index should be between 19-25. Your Body mass index is 32.92 kg/m. If this is out of the aformentioned range listed, please consider follow up with your Primary Care Provider.    We have sent the following medications to your pharmacy for you to pick up at your convenience: Doxycycline Robinul  STOP CLEOCIN.  Requesting colonoscopy report from [facility].  Call with an update in 7-10 days.  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  Thank you for choosing me and [facility].   Tye Savoy, NP

## 2019-05-27 ENCOUNTER — Telehealth: Payer: Self-pay | Admitting: Gastroenterology

## 2019-05-27 DIAGNOSIS — Z8719 Personal history of other diseases of the digestive system: Secondary | ICD-10-CM

## 2019-05-27 DIAGNOSIS — R197 Diarrhea, unspecified: Secondary | ICD-10-CM

## 2019-05-27 NOTE — Telephone Encounter (Signed)
Patient called around 520 this afternoon. The patient was evaluated yesterday by PA Chester Holstein. History of recurrent diverticulitis. Was transitioned to doxycycline. This morning she had a significant bout of diarrhea without any hematochezia or maroon or black stools. She is has some mild left lower quadrant discomfort similar to her prior bouts. Pain has been intermittent as it had been previously but is no worse than it has been before. Has drank multiple Gatorade's admixed with water to try and stay hydrated. Feels a bit rundown. However, no fevers or chills. She does not feel this is the worst bout of diverticulitis she has had previously. She is taken 3 doses of doxycycline for now. She describes previously being unable to tolerate ciprofloxacin or Flagyl or Augmentin as a result of issues with GI upset and nausea or vomiting. She did not call the office earlier in the day to let her know about the diarrheal movement. No further diarrheal movements since this morning. Per notation PA Chester Holstein had wanted to obtain stool studies if she had recurrent diarrhea. I will write for those. I told the patient to continue to take her medication as she has done in regards to her antibiotics as well as pain meds if necessary (she has done this for years). She is aware if things worsen or progress or she develops a fever that she needs to call us back and then understand that neck steps will be evaluation in the ED. She is having lots of flatulence. I have stool studies that are pending her to pick up tomorrow in the office.  She understands that she needs to stay in her car and call the office in the morning prior to coming into the office so that things can remain safe in the setting of the COVID-19 pandemic. I will send a message to [place] and [facility] to make them aware and for their RN to reach out to patient in the morning. Patient appreciative for call back this afternoon.   Justice Britain, MD [facility] Office # [phone]

## 2019-05-28 NOTE — Telephone Encounter (Signed)
Called patient, she is feeling just a little better. No more diarrhea since yesterday. Still intermittent LLQ pain. She is staying hydrated with gator-aid. She feels very run down and weak. She will try to have her husband pick-up the supplies for her stool tests today, or if she feels better she will come get them.

## 2019-05-28 NOTE — Telephone Encounter (Signed)
Ok. Thanks!

## 2019-05-28 NOTE — Telephone Encounter (Signed)
Okay thanks [employer]. She should continue to use pediatlye or gatorade to maintain hydration, continue doxycycline, and submit stool studies. If fevers or worsening she should contact us again. Of note, doxycycline is not the best regimen for possible diverticulitis but given antibiotic intolerances in the past, limited options

## 2019-05-28 NOTE — Telephone Encounter (Signed)
Patient called back this afternoon; when I called her back, she stated she feels much better and has not had diarrhea all day. She does not want to do the stool test.

## 2019-05-28 NOTE — Telephone Encounter (Signed)
Pt informed that she is feeling better today and will not pick up the stool sample kit.

## 2019-05-28 NOTE — Telephone Encounter (Signed)
Thanks much Gabe.  Morgan Rowland can you touch base with this patient to see how she is doing? Thanks

## 2019-06-10 ENCOUNTER — Telehealth: Payer: Self-pay | Admitting: Nurse Practitioner

## 2019-06-10 NOTE — Telephone Encounter (Signed)
Pt called back to update on her health condition.

## 2019-06-11 NOTE — Telephone Encounter (Signed)
Patient calls to tell you the infection cleared up with Doxycycline.  Robinul Forte has not changed the abdominal pain. It is not worse. It is the same as when she first saw you.

## 2019-06-14 NOTE — Telephone Encounter (Signed)
Patient confirms the "infection" is what she considers to be a diverticulitis. She is pain free presently. An appointment is scheduled for 07/20/19 with her primary GI.

## 2019-06-14 NOTE — Telephone Encounter (Signed)
Morgan Rowland, please clarify this with patient -   When she says the "infection" cleared up with Doxy is she referring to the diverticulitis type LLQ pain because she had also described a chronic lower abdominal pain that was mentioned in notes from her previous GI practice.   I received colon report from [facility]. Happy to evaluate her chronic lower abdominal pain at some point but first need to complete workup / treatment of the ACUTE diverticulitis type pain LLQ pain. If this isn't better then she needs CTAP. Thanks

## 2019-07-07 DIAGNOSIS — D229 Melanocytic nevi, unspecified: Secondary | ICD-10-CM | POA: Diagnosis not present

## 2019-07-07 DIAGNOSIS — D485 Neoplasm of uncertain behavior of skin: Secondary | ICD-10-CM | POA: Diagnosis not present

## 2019-07-07 DIAGNOSIS — L570 Actinic keratosis: Secondary | ICD-10-CM | POA: Diagnosis not present

## 2019-07-07 DIAGNOSIS — L820 Inflamed seborrheic keratosis: Secondary | ICD-10-CM | POA: Diagnosis not present

## 2019-07-12 DIAGNOSIS — R200 Anesthesia of skin: Secondary | ICD-10-CM | POA: Diagnosis not present

## 2019-07-12 DIAGNOSIS — R202 Paresthesia of skin: Secondary | ICD-10-CM | POA: Diagnosis not present

## 2019-07-12 DIAGNOSIS — G894 Chronic pain syndrome: Secondary | ICD-10-CM | POA: Diagnosis not present

## 2019-07-12 DIAGNOSIS — M4716 Other spondylosis with myelopathy, lumbar region: Secondary | ICD-10-CM | POA: Diagnosis not present

## 2019-07-20 ENCOUNTER — Ambulatory Visit (INDEPENDENT_AMBULATORY_CARE_PROVIDER_SITE_OTHER): Payer: Medicare Other | Admitting: Gastroenterology

## 2019-07-20 ENCOUNTER — Other Ambulatory Visit: Payer: Self-pay

## 2019-07-20 ENCOUNTER — Encounter: Payer: Self-pay | Admitting: Gastroenterology

## 2019-07-20 VITALS — BP 128/68 | HR 72 | Temp 98.4°F | Ht 61.0 in | Wt 175.0 lb

## 2019-07-20 DIAGNOSIS — R1032 Left lower quadrant pain: Secondary | ICD-10-CM

## 2019-07-20 DIAGNOSIS — F1190 Opioid use, unspecified, uncomplicated: Secondary | ICD-10-CM | POA: Diagnosis not present

## 2019-07-20 DIAGNOSIS — K219 Gastro-esophageal reflux disease without esophagitis: Secondary | ICD-10-CM

## 2019-07-20 MED ORDER — DICYCLOMINE HCL 10 MG PO CAPS: 10.0000 mg | ORAL_CAPSULE | Freq: Three times a day (TID) | ORAL | 3 refills | Status: DC | PRN

## 2019-07-20 MED ORDER — OMEPRAZOLE 20 MG PO CPDR: 20.0000 mg | DELAYED_RELEASE_CAPSULE | Freq: Two times a day (BID) | ORAL | 0 refills | Status: DC

## 2019-07-20 MED ORDER — OMEPRAZOLE 20 MG PO CPDR: 20.0000 mg | DELAYED_RELEASE_CAPSULE | Freq: Two times a day (BID) | ORAL | 5 refills | Status: DC

## 2019-07-20 MED ORDER — SUPREP BOWEL PREP KIT 17.5-3.13-1.6 GM/177ML PO SOLN: ORAL | 0 refills | Status: DC

## 2019-07-20 NOTE — Patient Instructions (Addendum)
If you are age 70 or older, your body mass index should be between 23-30. Your Body mass index is 33.07 kg/m. If this is out of the aforementioned range listed, please consider follow up with your Primary Care Provider.  If you are age 61 or younger, your body mass index should be between 19-25. Your Body mass index is 33.07 kg/m. If this is out of the aformentioned range listed, please consider follow up with your Primary Care Provider.   To help prevent the possible spread of infection to our patients, communities, and staff; we will be implementing the following measures:  As of now we are not allowing any visitors/family members to accompany you to any upcoming appointments with [facility]. If you have any concerns about this please contact our office to discuss prior to the appointment.   You have been scheduled for a endoscopy/colonoscopy. Please follow written instructions given to you at your visit today.  Please pick up your prep supplies at the pharmacy within the next 1-3 days. If you use inhalers (even only as needed), please bring them with you on the day of your procedure. Your physician has requested that you go to www.startemmi.com and enter the access code given to you at your visit today. This web site gives a general overview about your procedure. However, you should still follow specific instructions given to you by our office regarding your preparation for the procedure.  Stop Robinul.  Increase Omeprazole 20mg  to twice a day.  We have sent the following medications to your pharmacy for you to pick up at your convenience: Bentyl 10mg : Take 1 to 2 tablets every 8 hours as needed   Thank you for entrusting me with your care and for choosing [facility], Dr. [place] Cellar

## 2019-07-20 NOTE — Progress Notes (Signed)
HPI :  68 year old female here for a follow-up visit.  She recently established care with our office, was seen by Tye Savoy on [address].  She relays a history of chronic abdominal pain in the left lower quadrant, to lower mid abdomen.  She states about 20 years ago she had a CT scan that showed she had diverticulitis and was treated with antibiotics.  Since that time she has had periodic left lower quadrant pain.  For the most part she has reliable discomfort after every bowel movement.  She has 1-2 bowel movements per day.  She has a strong urge to use the bathroom and then after she is done she has cramping in the left lower quadrant to lower abdomen for anywhere from an hour to several hours.  Her stools are soft and on the looser side but not frankly diarrhea.  She denies any constipation.  She previously had constipation however has not had problems in a few years.  Her patterns of symptoms is fairly reliable in this regard.  She was given a course of an antibiotic in [place] for some persistent pain that was outside of her normal symptom, she thinks that got better and was back to normal baseline.  She denies any weight loss.  She denies any blood in her stools.  Her symptoms seem stable for the past few months.  She has chronic back pain, had a back surgery in 2010.  Since that time she is been on chronic narcotics, she takes Norco, 4 to 5 tablets/day.  In regards to regimen she is tried for this in the past, she has been on Bentyl in the past which she states has helped somewhat but did not relieve it completely.  She was recently tried on Robinul which did not help.  She has been on fiber supplements in the past which did not help.  Her last colonoscopy was in [place] 2011, done by Dr. Laural Golden, interestingly was told she did not have any diverticulosis and it was a normal exam.  She last had cross-sectional imaging with a CT scan in August 2015 which did not show any evidence of diverticulitis at  that time.  Otherwise she endorses chronic reflux symptoms that bother her.  She has daily pyrosis and regurgitation despite taking Prilosec 20 mg once a day.  She has not had trial of higher dose of PPI in the past.  She uses Maalox and Tums frequently.  He denies any dysphagia.  She denies any family history of esophageal or colon cancer.  She has never had a prior EGD.   Past Medical History:  Diagnosis Date   Cervical disc disease    Constipation 04/21/2017   Diverticulitis    External hemorrhoids    GERD (gastroesophageal reflux disease)    High cholesterol    Hypercholesteremia    IBS (irritable bowel syndrome)    Osteoporosis    Parotid tumor      Past Surgical History:  Procedure Laterality Date   APPENDECTOMY     CHOLECYSTECTOMY     COLONOSCOPY  2011   Dr Laural Golden in [place]   Spinal fusion x 2     TONSILLECTOMY     TOTAL ABDOMINAL HYSTERECTOMY     Family History  Problem Relation Age of Onset   Throat cancer Brother    Colon cancer Neg Hx    Esophageal cancer Neg Hx    Social History   Tobacco Use   Smoking status: Never  Smoker   Smokeless tobacco: Never Used  Substance Use Topics   Alcohol use: No   Drug use: No   Current Outpatient Medications  Medication Sig Dispense Refill   aluminum-magnesium hydroxide-simethicone (MAALOX) 932-355-73 MG/5ML SUSP Take by mouth as needed (1 tablespoon).     aspirin 81 MG tablet Take 81 mg by mouth daily.     atorvastatin (LIPITOR) 10 MG tablet Take 10 mg by mouth daily.     HYDROcodone-acetaminophen (NORCO) 10-325 MG per tablet Take 1 tablet by mouth every 6 (six) hours as needed.     lisinopril (PRINIVIL,ZESTRIL) 20 MG tablet Take 20 mg by mouth daily.     Multiple Vitamins-Minerals (MULTIVITAMIN WITH MINERALS) tablet Take 1 tablet by mouth daily.     NARCAN 4 MG/0.1ML LIQD nasal spray kit USE 1 SPRAY AS NEEDED FOR ACCIDENTAL OVERDOSE     raloxifene (EVISTA) 60 MG tablet Take 60 mg by  mouth daily.     valACYclovir (VALTREX) 500 MG tablet Take 500 mg by mouth every other day.     dicyclomine (BENTYL) 10 MG capsule Take 1-2 capsules (10-20 mg total) by mouth every 8 (eight) hours as needed for spasms. 90 capsule 3   omeprazole (PRILOSEC) 20 MG capsule Take 1 capsule (20 mg total) by mouth 2 (two) times daily before a meal. 60 capsule 5   SUPREP BOWEL PREP KIT 17.5-3.13-1.6 GM/177ML SOLN Suprep-Use as directed 354 mL 0   No current facility-administered medications for this visit.    Allergies  Allergen Reactions   Sulfa Antibiotics     itch     Review of Systems: All systems reviewed and negative except where noted in HPI.   No results found for: WBC, HGB, HCT, MCV, PLT  Lab Results  Component Value Date   CREATININE 0.80 12/17/2018    No results found for: ALT, AST, GGT, ALKPHOS, BILITOT    Physical Exam: BP 128/68    Pulse 72    Temp 98.4 F (36.9 C) (Oral)    Ht 5' 1"  (1.549 m)    Wt 175 lb (79.4 kg)    BMI 33.07 kg/m  Constitutional: Pleasant,well-developed, female in no acute distress. HEENT: Normocephalic and atraumatic. Conjunctivae are normal. No scleral icterus. Neck supple.  Cardiovascular: Normal rate, regular rhythm.  Pulmonary/chest: Effort normal and breath sounds normal. No wheezing, rales or rhonchi. Abdominal: Soft, nondistended, nontender. . There are no masses palpable. No hepatomegaly. Extremities: no edema Lymphadenopathy: No cervical adenopathy noted. Neurological: Alert and oriented to person place and time. Skin: Skin is warm and dry. No rashes noted. Psychiatric: Normal mood and affect. Behavior is normal.   ASSESSMENT AND PLAN: 68 year old female here for reassessment of the following:  Left lower quadrant pain / chronic narcotic use - interestingly she reports a history of diverticulitis with reportedly CT evidence of diverticulitis however no diverticulosis was appreciated on her last colonoscopy, almost 10 years ago.   She has reliable pain on a daily basis after each bowel movement.  We discussed differential diagnosis which includes bowel spasm / IBS, versus symptomatic diverticular disease / stricture, chronic diverticulitis which seems less likely, or perhaps a component of narcotic bowel given her chronic narcotic use.  She does think that Bentyl has helped her in the past, I would recommend she resume that at 10 mg, 1 tab every 8 hours as needed moving forward.  Otherwise discussed options with her.  I think a colonoscopy is reasonable to assess for diverticulosis, ensure no stricturing,  perform her screening and to reevaluate the area given her ongoing symptoms and that her last exam was almost 10 years ago.  Discussed risk and benefits of colonoscopy and anesthesia and she want to proceed.  Pending this result, we may consider trial of rifaximin which would treat both IBS and symptomatic diverticular disease.  Also could consider adding a TCA.  Long-term, given her age, and her symptoms, it may be in her best interest to wean off the narcotics and be treated with another form of pain control.  She is very interested in coming off the narcotics and I recommend she follow-up with her pain management physician to discuss options.  She agreed, further recommendations pending colonoscopy result.  GERD - ongoing years worth of symptoms that have been poorly controlled with low-dose PPI.  Using Maalox frequently.  She has no alarm symptoms but given ongoing symptoms despite PPI and duration of symptoms, I offered her an EGD to be done at the same time as her colonoscopy.  I discussed risks and benefits and she want to proceed.  In the interim we will increase her omeprazole to 20 mg twice a day and see if that provides any additional benefit.  She agreed  Darien Cellar, MD [facility]

## 2019-07-21 ENCOUNTER — Telehealth: Payer: Self-pay

## 2019-07-21 ENCOUNTER — Other Ambulatory Visit: Payer: Self-pay

## 2019-07-21 ENCOUNTER — Telehealth: Payer: Self-pay | Admitting: Gastroenterology

## 2019-07-21 MED ORDER — PLENVU 140 G PO SOLR: 1.0000 | Freq: Once | ORAL | 0 refills | Status: AC

## 2019-07-21 NOTE — Telephone Encounter (Signed)
Suprep not covered by her insurance ($122). Sent script for Plenvu to her pharmacy and redid procedure instructions.  Pt will come in tomorrow to get new instructions and a sample of Plenvu as it was also not covered by her insurance.

## 2019-07-21 NOTE — Progress Notes (Signed)
suprep would be $122 for pt.  Sent script for Plenvu. If not covered we have sample

## 2019-07-21 NOTE — Telephone Encounter (Signed)
PA for Bentyl submitted to CoverMyMeds

## 2019-07-21 NOTE — Telephone Encounter (Signed)
Pt reported that insurance does not cover prep soln.  Pt is scheduled 8/21 colon

## 2019-07-22 ENCOUNTER — Telehealth: Payer: Self-pay

## 2019-07-22 NOTE — Telephone Encounter (Signed)
Called and spoke to pt. She picked up the Bentyl already but I let her know about GoodRx in case she wants to try that in the future. Pt expressed understanding.

## 2019-07-22 NOTE — Telephone Encounter (Signed)
[employer] called to inform that PA was "denied due to diagnosis."  Appeal # [id number].

## 2019-07-22 NOTE — Telephone Encounter (Signed)
Covid-19 screening questions   Do you now or have you had a fever in the last 14 days? NO  Do you have any respiratory symptoms of shortness of breath or cough now or in the last 14 days? NO  Do you have any family members or close contacts with diagnosed or suspected Covid-19 in the past 14 days? NO  Have you been tested for Covid-19 and found to be positive? NO     Confirmed with patient   

## 2019-07-23 ENCOUNTER — Other Ambulatory Visit: Payer: Self-pay

## 2019-07-23 ENCOUNTER — Encounter: Payer: Self-pay | Admitting: Gastroenterology

## 2019-07-23 ENCOUNTER — Ambulatory Visit (AMBULATORY_SURGERY_CENTER): Payer: Medicare Other | Admitting: Gastroenterology

## 2019-07-23 VITALS — BP 141/66 | HR 85 | Temp 97.3°F | Resp 14 | Ht 61.0 in | Wt 175.0 lb

## 2019-07-23 DIAGNOSIS — D125 Benign neoplasm of sigmoid colon: Secondary | ICD-10-CM | POA: Diagnosis not present

## 2019-07-23 DIAGNOSIS — D128 Benign neoplasm of rectum: Secondary | ICD-10-CM | POA: Diagnosis not present

## 2019-07-23 DIAGNOSIS — Z1211 Encounter for screening for malignant neoplasm of colon: Secondary | ICD-10-CM | POA: Diagnosis not present

## 2019-07-23 DIAGNOSIS — D122 Benign neoplasm of ascending colon: Secondary | ICD-10-CM

## 2019-07-23 DIAGNOSIS — K219 Gastro-esophageal reflux disease without esophagitis: Secondary | ICD-10-CM | POA: Diagnosis not present

## 2019-07-23 DIAGNOSIS — K317 Polyp of stomach and duodenum: Secondary | ICD-10-CM

## 2019-07-23 DIAGNOSIS — D127 Benign neoplasm of rectosigmoid junction: Secondary | ICD-10-CM | POA: Diagnosis not present

## 2019-07-23 DIAGNOSIS — R1032 Left lower quadrant pain: Secondary | ICD-10-CM

## 2019-07-23 HISTORY — PX: UPPER GI ENDOSCOPY: SHX6162

## 2019-07-23 MED ORDER — SODIUM CHLORIDE 0.9 % IV SOLN: 500.0000 mL | Freq: Once | INTRAVENOUS | Status: AC

## 2019-07-23 NOTE — Progress Notes (Signed)
Received patient in recovery post upper endoscopy and colonoscopy. Patient moaning and softly crying upon arrival to recovery. CRNA states patient has been like this since receiving the anesthesia. Patient VSS, but c/o abdominal cramping and unable to pass air. Administered Levsyn 0.25mg  sl per protocol, repositioned patient multiples times and administered percussion to patient's back with success, pt. Passing air, but still states she is uncomfortable. Escorted pt. To bathroom to dress. Pt. States she's passed more air in bathroom with relief from abdominal cramping, but concerned she's unable to pass urine. Pt. States she still feels emotional. Dr. Havery Moros has returned to talk with patient again prior to discharge. Pt. Meets requirements for discharge.Pt. States she just wants to go home. I spoke with patient at length, encouraged her to call us if her symptoms don't continue to improved. Pt. Verbalizes understanding.

## 2019-07-23 NOTE — Progress Notes (Signed)
A and O x3. Report to RN. Tolerated MAC anesthesia well.Teeth unchanged after procedure.

## 2019-07-23 NOTE — Op Note (Signed)
[facility] Patient Name: Morgan Rowland Procedure Date: 07/23/2019 2:39 PM MRN: [id number] Endoscopist: Remo Lipps P. Havery Moros , MD Age: 68 Referring MD:  Date of Birth: [birth year] Gender: Female Account #: [id number] Procedure:                Upper GI endoscopy Indications:              chronic gastro-esophageal reflux disease - ongoing                            symptoms despite low dose omeprazole, rule out                            Barrett's - omeprazole just increased to 20mg  twice                            daily Medicines:                Monitored Anesthesia Care Procedure:                Pre-Anesthesia Assessment:                           - Prior to the procedure, a History and Physical                            was performed, and patient medications and                            allergies were reviewed. The patient's tolerance of                            previous anesthesia was also reviewed. The risks                            and benefits of the procedure and the sedation                            options and risks were discussed with the patient.                            All questions were answered, and informed consent                            was obtained. Prior Anticoagulants: The patient has                            taken no previous anticoagulant or antiplatelet                            agents. ASA Grade Assessment: II - A patient with                            mild systemic disease. After reviewing the risks  and benefits, the patient was deemed in                            satisfactory condition to undergo the procedure.                           After obtaining informed consent, the endoscope was                            passed under direct vision. Throughout the                            procedure, the patient's blood pressure, pulse, and                            oxygen saturations were monitored continuously.  The                            Endoscope was introduced through the mouth, and                            advanced to the second part of duodenum. The upper                            GI endoscopy was accomplished without difficulty.                            The patient tolerated the procedure well. Scope In: Scope Out: Findings:                 Esophagogastric landmarks were identified: the                            Z-line was found at 36 cm, the gastroesophageal                            junction was found at 36 cm and the upper extent of                            the gastric folds was found at 36 cm from the                            incisors.                           The exam of the esophagus was otherwise normal.                           A few small benign appearing sessile polyps were                            found in the gastric body. A representative polyp  was removed with a cold biopsy forceps. Resection                            and retrieval were complete.                           The exam of the stomach was otherwise normal.                           The duodenal bulb and second portion of the                            duodenum were normal. Complications:            No immediate complications. Estimated blood loss:                            Minimal. Estimated Blood Loss:     Estimated blood loss was minimal. Impression:               - Esophagogastric landmarks identified.                           - Normal esophagus - no evidence of Barrett's                           - A few benign appearing gastric polyps.                            Representative sample resected and retrieved.                           - Normal stomach otherwise                           - Normal duodenal bulb and second portion of the                            duodenum. Recommendation:           - Patient has a contact number available for                             emergencies. The signs and symptoms of potential                            delayed complications were discussed with the                            patient. Return to normal activities tomorrow.                            Written discharge instructions were provided to the                            patient.                           -  Resume previous diet.                           - Continue present medications.                           - Continue trial of higher dose omeprazole for                            another few weeks. If no response, please contact                            me to discuss other options                           - Await pathology results. Remo Lipps P. Havery Moros, MD 07/23/2019 3:28:22 PM This report has been signed electronically.

## 2019-07-23 NOTE — Patient Instructions (Signed)
YOU HAD AN ENDOSCOPIC PROCEDURE TODAY AT [facility]:   Refer to the procedure report that was given to you for any specific questions about what was found during the examination.  If the procedure report does not answer your questions, please call your gastroenterologist to clarify.  If you requested that your care partner not be given the details of your procedure findings, then the procedure report has been included in a sealed envelope for you to review at your convenience later.  YOU SHOULD EXPECT: Some feelings of bloating in the abdomen. Passage of more gas than usual.  Walking can help get rid of the air that was put into your GI tract during the procedure and reduce the bloating. If you had a lower endoscopy (such as a colonoscopy or flexible sigmoidoscopy) you may notice spotting of blood in your stool or on the toilet paper. If you underwent a bowel prep for your procedure, you may not have a normal bowel movement for a few days.  Please Note:  You might notice some irritation and congestion in your nose or some drainage.  This is from the oxygen used during your procedure.  There is no need for concern and it should clear up in a day or so.  SYMPTOMS TO REPORT IMMEDIATELY:   Following lower endoscopy (colonoscopy or flexible sigmoidoscopy):  Excessive amounts of blood in the stool  Significant tenderness or worsening of abdominal pains  Swelling of the abdomen that is new, acute  Fever of 100F or higher   Following upper endoscopy (EGD)  Vomiting of blood or coffee ground material  New chest pain or pain under the shoulder blades  Painful or persistently difficult swallowing  New shortness of breath  Fever of 100F or higher  Black, tarry-looking stools  For urgent or emergent issues, a gastroenterologist can be reached at any hour by calling [phone].   DIET:  We do recommend a small meal at first, but then you may proceed to your regular diet.  Drink  plenty of fluids but you should avoid alcoholic beverages for 24 hours.  MEDICATIONS: Continue present medications. Continue Bentyl as needed. Continue a trial of higher dose omeprazole for another few weeks. If no response, please call Dr. Havery Moros to discuss other options. Wean off of narcotics if possible.  Please see handouts given to you by your recovery nurse.  ACTIVITY:  You should plan to take it easy for the rest of today and you should NOT DRIVE or use heavy machinery until tomorrow (because of the sedation medicines used during the test).    FOLLOW UP: Our staff will call the number listed on your records 48-72 hours following your procedure to check on you and address any questions or concerns that you may have regarding the information given to you following your procedure. If we do not reach you, we will leave a message.  We will attempt to reach you two times.  During this call, we will ask if you have developed any symptoms of COVID 19. If you develop any symptoms (ie: fever, flu-like symptoms, shortness of breath, cough etc.) before then, please call [phone].  If you test positive for Covid 19 in the 2 weeks post procedure, please call and report this information to [place].    If any biopsies were taken you will be contacted by phone or by letter within the next 1-3 weeks.  Please call us at [phone] if you have not heard about  the biopsies in 3 weeks.   Thank you for allowing [place] to provide for your healthcare needs today.   SIGNATURES/CONFIDENTIALITY: You and/or your care partner have signed paperwork which will be entered into your electronic medical record.  These signatures attest to the fact that that the information above on your After Visit Summary has been reviewed and is understood.  Full responsibility of the confidentiality of this discharge information lies with you and/or your care-partner.

## 2019-07-23 NOTE — Progress Notes (Signed)
Pt's states no medical or surgical changes since previsit or office visit.  JB temps and CW vitals. Sm

## 2019-07-23 NOTE — Op Note (Signed)
[facility] Patient Name: Morgan Rowland Procedure Date: 07/23/2019 2:39 PM MRN: [id number] Endoscopist: Remo Lipps P. Havery Moros , MD Age: 68 Referring MD:  Date of Birth: 08/01/1951 Gender: Female Account #: [id number] Procedure:                Colonoscopy Indications:              Abdominal pain in the left lower quadrant -                            chronic, reported history of diverticulitis Medicines:                Monitored Anesthesia Care Procedure:                Pre-Anesthesia Assessment:                           - Prior to the procedure, a History and Physical                            was performed, and patient medications and                            allergies were reviewed. The patient's tolerance of                            previous anesthesia was also reviewed. The risks                            and benefits of the procedure and the sedation                            options and risks were discussed with the patient.                            All questions were answered, and informed consent                            was obtained. Prior Anticoagulants: The patient has                            taken no previous anticoagulant or antiplatelet                            agents. ASA Grade Assessment: II - A patient with                            mild systemic disease. After reviewing the risks                            and benefits, the patient was deemed in                            satisfactory condition to undergo the procedure.  After obtaining informed consent, the colonoscope                            was passed under direct vision. Throughout the                            procedure, the patient's blood pressure, pulse, and                            oxygen saturations were monitored continuously. The                            Colonoscope was introduced through the anus and                            advanced to the the  terminal ileum, with                            identification of the appendiceal orifice and IC                            valve. The colonoscopy was performed without                            difficulty. The patient tolerated the procedure                            well. The quality of the bowel preparation was                            good. The terminal ileum, ileocecal valve,                            appendiceal orifice, and rectum were photographed. Scope In: 2:53:25 PM Scope Out: 3:17:14 PM Scope Withdrawal Time: 0 hours 16 minutes 29 seconds  Total Procedure Duration: 0 hours 23 minutes 49 seconds  Findings:                 The perianal and digital rectal examinations were                            normal.                           The terminal ileum appeared normal.                           Two sessile polyps were found in the ascending                            colon. The polyps were 2 to 3 mm in size. These                            polyps were removed with a cold snare. Resection  and retrieval were complete.                           A diminutive polyp was found in the sigmoid colon.                            The polyp was sessile. The polyp was removed with a                            cold snare. Resection and retrieval were complete.                           A 3 mm polyp was found in the rectum. The polyp was                            sessile. The polyp was removed with a cold snare.                            Resection and retrieval were complete.                           The colon was extremely tortuous.                           Internal hemorrhoids were found during retroflexion.                           The exam was otherwise without abnormality. No                            diverticulosis noted, no luminal stricturing. Complications:            No immediate complications. Estimated blood loss:                             Minimal. Estimated Blood Loss:     Estimated blood loss was minimal. Impression:               - The examined portion of the ileum was normal.                           - Two 2 to 3 mm polyps in the ascending colon,                            removed with a cold snare. Resected and retrieved.                           - One diminutive polyp in the sigmoid colon,                            removed with a cold snare. Resected and retrieved.                           - One 3 mm polyp in  the rectum, removed with a cold                            snare. Resected and retrieved.                           - Tortuous colon.                           - Internal hemorrhoids.                           - The examination was otherwise normal.                           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 Recommendation:           - Patient has a contact number available for                            emergencies. The signs and symptoms of potential                            delayed complications were discussed with the                            patient. Return to normal activities tomorrow.                            Written discharge instructions were provided to the                            patient.                           - Resume previous diet.                           - Continue present medications.                           - Await pathology results.                           - Continue Bentyl PRN                           - Consideration for TCA or trial of Rifaximin, will                            discuss options with patient                           -  Wean off narcotics if possible Remo Lipps P. Havery Moros, MD 07/23/2019 3:23:48 PM This report has been signed electronically.

## 2019-07-26 ENCOUNTER — Telehealth: Payer: Self-pay

## 2019-07-26 NOTE — Telephone Encounter (Signed)
  Follow up Call-  Call back number 07/23/2019  Post procedure Call Back phone  # [phone]  Permission to leave phone message Yes  Some recent data might be hidden     Patient questions:  Do you have a fever, pain , or abdominal swelling? No. Pain Score  0 *  Have you tolerated food without any problems? Yes.    Have you been able to return to your normal activities? Yes.    Do you have any questions about your discharge instructions: Diet   No. Medications  No. Follow up visit  No.  Do you have questions or concerns about your Care? No.  Actions: * If pain score is 4 or above: No action needed, pain <4.  1. Have you developed a fever since your procedure? no  2.   Have you had an respiratory symptoms (SOB or cough) since your procedure? no  3.   Have you tested positive for COVID 19 since your procedure no  4.   Have you had any family members/close contacts diagnosed with the COVID 19 since your procedure?  no   If yes to any of these questions please route to Joylene John, RN and Alphonsa Gin, Therapist, sports.

## 2019-07-30 ENCOUNTER — Other Ambulatory Visit: Payer: Self-pay

## 2019-07-30 MED ORDER — NORTRIPTYLINE HCL 10 MG PO CAPS: ORAL_CAPSULE | ORAL | 3 refills | Status: DC

## 2019-08-10 DIAGNOSIS — M545 Low back pain: Secondary | ICD-10-CM | POA: Diagnosis not present

## 2019-08-10 DIAGNOSIS — E7801 Familial hypercholesterolemia: Secondary | ICD-10-CM | POA: Diagnosis not present

## 2019-08-10 DIAGNOSIS — F419 Anxiety disorder, unspecified: Secondary | ICD-10-CM | POA: Diagnosis not present

## 2019-08-10 DIAGNOSIS — G894 Chronic pain syndrome: Secondary | ICD-10-CM | POA: Diagnosis not present

## 2019-08-10 DIAGNOSIS — Z760 Encounter for issue of repeat prescription: Secondary | ICD-10-CM | POA: Diagnosis not present

## 2019-08-10 DIAGNOSIS — G8929 Other chronic pain: Secondary | ICD-10-CM | POA: Diagnosis not present

## 2019-08-10 DIAGNOSIS — I10 Essential (primary) hypertension: Secondary | ICD-10-CM | POA: Diagnosis not present

## 2019-08-13 DIAGNOSIS — G4700 Insomnia, unspecified: Secondary | ICD-10-CM | POA: Diagnosis not present

## 2019-08-13 DIAGNOSIS — I10 Essential (primary) hypertension: Secondary | ICD-10-CM | POA: Diagnosis not present

## 2019-08-13 DIAGNOSIS — Z0000 Encounter for general adult medical examination without abnormal findings: Secondary | ICD-10-CM | POA: Diagnosis not present

## 2019-08-13 DIAGNOSIS — E7801 Familial hypercholesterolemia: Secondary | ICD-10-CM | POA: Diagnosis not present

## 2019-08-16 ENCOUNTER — Telehealth: Payer: Self-pay | Admitting: Gastroenterology

## 2019-08-16 NOTE — Telephone Encounter (Signed)
Called and spoke to pt. She started with 1 tablet of nortriptyline at bedtime and at first she could not sleep.  She stayed with it however and eventually it got better after a week. However, when she increased to 2 tablets at bedtime she was up all night and felt totally "jacked".  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 She indicated she has tried other anti anxiety medications in the past like Lexapro and Zoloft and they have had the same effect. She did want to let you know that the nortriptyline has really helped with the pain. Please advise. (FYI pt indicated she would rather wait until Armbruster came back on Tuesday than for me to speak to the Doc of the Day about her situation today.  She indicated she will Not take any nortriptyline tonight or in the morning until she hears from [place]).

## 2019-08-17 NOTE — Telephone Encounter (Signed)
Thanks for the update Jan.   Sounds like she is not tolerating the 2 tabs / day. If she has found that it helps her pain however, let's try decreasing back to the 10mg  / day regimen (once daily) and see if she can tolerate it and still get some benefit from that dose. Can take it either in AM or PM. If she is not tolerating that dose then will have to think of something else, but hopefully based on how you describe things she seemed to do okay with it at that dose after a bit of time. Thanks

## 2019-08-17 NOTE — Telephone Encounter (Signed)
Called and spoke to pt.  Relayed recommendation from Dr. Havery Moros.  She was in agreement and will keep [place] posted. Updated medication list to reflect 1 tablet once daily

## 2019-08-18 ENCOUNTER — Ambulatory Visit: Payer: Medicare Other | Admitting: Neurology

## 2019-09-08 DIAGNOSIS — G894 Chronic pain syndrome: Secondary | ICD-10-CM | POA: Diagnosis not present

## 2019-09-08 DIAGNOSIS — Z79899 Other long term (current) drug therapy: Secondary | ICD-10-CM | POA: Diagnosis not present

## 2019-09-08 DIAGNOSIS — M4716 Other spondylosis with myelopathy, lumbar region: Secondary | ICD-10-CM | POA: Diagnosis not present

## 2019-09-08 DIAGNOSIS — Z760 Encounter for issue of repeat prescription: Secondary | ICD-10-CM | POA: Diagnosis not present

## 2019-09-21 ENCOUNTER — Ambulatory Visit: Payer: Medicare Other | Admitting: Gastroenterology

## 2019-09-21 ENCOUNTER — Other Ambulatory Visit: Payer: Self-pay

## 2019-09-21 ENCOUNTER — Encounter: Payer: Self-pay | Admitting: Gastroenterology

## 2019-09-21 VITALS — BP 120/76 | HR 72 | Temp 98.6°F | Ht 61.0 in | Wt 174.4 lb

## 2019-09-21 DIAGNOSIS — R1032 Left lower quadrant pain: Secondary | ICD-10-CM | POA: Diagnosis not present

## 2019-09-21 DIAGNOSIS — K5989 Other specified functional intestinal disorders: Secondary | ICD-10-CM

## 2019-09-21 DIAGNOSIS — K219 Gastro-esophageal reflux disease without esophagitis: Secondary | ICD-10-CM | POA: Diagnosis not present

## 2019-09-21 MED ORDER — NORTRIPTYLINE HCL 10 MG/5ML PO SOLN: 15.0000 mg | Freq: Every day | ORAL | 6 refills | Status: AC

## 2019-09-21 MED ORDER — IBGARD 90 MG PO CPCR: 2.0000 | ORAL_CAPSULE | ORAL | Status: AC | PRN

## 2019-09-21 NOTE — Progress Notes (Signed)
HPI :  68 year old female here for a follow-up visit.  See prior clinic note for full details of her history.  She has a history of chronic abdominal pain associated with her bowel habits ongoing for more than 20 years.  She reports a remote history of diverticulitis around time of onset of symptoms.  She had a CT scan of her abdomen and pelvis in August 2015 for the symptoms, there was no concerning pathology to correlate to her symptoms.  Of note no diverticular disease was noted.  Since her last visit with me, she underwent an EGD and colonoscopy in August of this year.  She had a few small polyps of her colon removed, no concerning pathology otherwise to cause her chronic symptoms.  Specifically I did not appreciate any obvious diverticuli otherwise.  Based off her prior imaging, colonoscopy results, and her symptoms I suspect that she may have visceral hypersensitivity causing her symptoms.  We placed her on a trial of Pamelor dosed at 10 mg a day with instructions to increase to 20 mg a day after 2 weeks.  She states at the 10 mg dose she felt considerable improvement.  The duration and intensity of her cramps and abdominal pain has been improved.  She tried increasing to 20 mg a day but did not tolerate it, and states she felt like she was "on speed", felt hypersensitive and had a hard time sleeping.  She has been maintained on the 69m dose over the past several weeks and states she definitely feels improvement now compared to she did before, however still has some symptoms that bother her.  She essentially has significant bowel spasm when she has a bowel movement.  Historically these cramps can be intense and has lasted for several hours at a time, at this point time the duration of the cramps is much less than previous but she continues to have them.  She takes Bentyl as needed which also provides benefit.  Currently she is having 1 formed bowel movement in the morning every day, no constipation or  diarrhea.  No blood in her stools.  We had discussed her chronic pain regimen, she is on chronic Norco for back pain.  She has cut back on the dose and is using it 3 times a day instead of 4-5 times a day.  Otherwise regarding her reflux we had increased her dose of omeprazole to 20 mg twice a day since have seen her.  Her reflux symptoms are much better controlled and she denies any breakthrough on this dose.  She had an EGD in August of this year with me which did not show any evidence of Barrett's esophagus or any concerning pathology.  She states the twice daily dosing has worked much better for her than the once daily dosing.   Recent workup:  EGD 07/23/19 -  - Normal esophagus - no evidence of Barrett's - A few benign appearing gastric polyps. Representative sample resected and retrieved. Benign path - Normal stomach otherwise - Normal duodenal bulb and second portion of the duodenum.  Colonoscopy 07/23/19 - The perianal and digital rectal examinations were normal. - The terminal ileum appeared normal. - Two sessile polyps were found in the ascending colon. The polyps were 2 to 3 mm in size. These polyps were removed with a cold snare. Resection and retrieval were complete. - A diminutive polyp was found in the sigmoid colon. The polyp was sessile. The polyp was removed with a cold snare.  Resection and retrieval were complete. - A 3 mm polyp was found in the rectum. The polyp was sessile. The polyp was removed with a cold snare. Resection and retrieval were complete. - The colon was extremely tortuous. - Internal hemorrhoids were found during retroflexion. - The exam was otherwise without abnormality. No diverticulosis noted, no luminal stricturing.  Path shows 4 small adenomas, repeat colonoscopy in 5 years      Past Medical History:  Diagnosis Date   Cervical disc disease    Constipation 04/21/2017   Diverticulitis    External hemorrhoids    GERD (gastroesophageal reflux  disease)    High cholesterol    Hypercholesteremia    IBS (irritable bowel syndrome)    Osteoporosis    Parotid tumor      Past Surgical History:  Procedure Laterality Date   APPENDECTOMY     CHOLECYSTECTOMY     COLONOSCOPY  2011   Dr Laural Golden in [place]   Spinal fusion x 2     TONSILLECTOMY     TOTAL ABDOMINAL HYSTERECTOMY     Family History  Problem Relation Age of Onset   Throat cancer Brother    Colon cancer Neg Hx    Esophageal cancer Neg Hx    Stomach cancer Neg Hx    Rectal cancer Neg Hx    Social History   Tobacco Use   Smoking status: Never Smoker   Smokeless tobacco: Never Used  Substance Use Topics   Alcohol use: No   Drug use: No   Current Outpatient Medications  Medication Sig Dispense Refill   aspirin 81 MG tablet Take 81 mg by mouth daily.     atorvastatin (LIPITOR) 10 MG tablet Take 10 mg by mouth daily.     dicyclomine (BENTYL) 10 MG capsule Take 1-2 capsules (10-20 mg total) by mouth every 8 (eight) hours as needed for spasms. 90 capsule 3   HYDROcodone-acetaminophen (NORCO) 10-325 MG per tablet Take 1 tablet by mouth every 6 (six) hours as needed.     lisinopril (PRINIVIL,ZESTRIL) 20 MG tablet Take 20 mg by mouth daily.     Multiple Vitamins-Minerals (MULTIVITAMIN WITH MINERALS) tablet Take 1 tablet by mouth daily.     NARCAN 4 MG/0.1ML LIQD nasal spray kit USE 1 SPRAY AS NEEDED FOR ACCIDENTAL OVERDOSE     nortriptyline (PAMELOR) 10 MG capsule Take 1 capsule (10 mg total) by mouth at bedtime. 60 capsule 3   omeprazole (PRILOSEC) 20 MG capsule Take 1 capsule (20 mg total) by mouth 2 (two) times daily before a meal. 60 capsule 5   raloxifene (EVISTA) 60 MG tablet Take 60 mg by mouth daily.     valACYclovir (VALTREX) 500 MG tablet Take 500 mg by mouth every other day.     Current Facility-Administered Medications  Medication Dose Route Frequency Provider Last Rate Last Dose   0.9 %  sodium chloride infusion  500 mL  Intravenous Once Armbruster, Carlota Raspberry, MD       Allergies  Allergen Reactions   Sulfa Antibiotics     itch     Review of Systems: All systems reviewed and negative except where noted in HPI.   No recent labs on file  Physical Exam: BP 120/76    Pulse 72    Temp 98.6 F (37 C)    Ht 5' 1" (1.549 m)    Wt 174 lb 6.4 oz (79.1 kg)    BMI 32.95 kg/m  Constitutional: Pleasant,well-developed, female in no  acute distress. HEENT: Normocephalic and atraumatic. Conjunctivae are normal. No scleral icterus. Neck supple.  Cardiovascular: Normal rate, regular rhythm.  Pulmonary/chest: Effort normal and breath sounds normal. No wheezing, rales or rhonchi. Abdominal: Soft, nondistended, nontender.  There are no masses palpable. No hepatomegaly. Extremities: no edema Lymphadenopathy: No cervical adenopathy noted. Neurological: Alert and oriented to person place and time. Skin: Skin is warm and dry. No rashes noted. Psychiatric: Normal mood and affect. Behavior is normal.   ASSESSMENT AND PLAN: 68 year old female here for reassessment of the following:  Lower abdominal pain / visceral hypersensitivity - history as above, negative colonoscopy and CT scan as work-up for chronic lower abdominal pain related to her bowels ongoing for more than 20 years.  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 She has found clear improvement with low-dose nortriptyline, has had difficulty with increasing the dose to 30m however due to side effects.  We discussed options, she definitely wants to continue the nortriptyline and we discussed perhaps increasing the dose from 10 mg to 15 mg a day.  Unfortunately there is only capsule and liquid dosing options for nortriptyline, will switch her to the liquid dose to accommodate 15 mg a day and see how she does with this.  If she does not tolerate it she can titrate down the dose again to 163m  Otherwise recommend adding IBgard to her regimen as  needed to see if that will help, and she will also continue to try to wean off her narcotics.  I would like her up-to-date labs to ensure normal from Dr. HoLyman Spellerffice, will request that.  Otherwise encouraged her to sign up for my chart so she can communicate with me regarding her symptoms moving forward.  She can follow-up with me as needed for this issue moving forward she continues to find improvement, if no clear improvement on change in regimen she can contact me for reassessment.  GERD - significantly improved on higher dose omeprazole dosed at twice daily.  No evidence of Barrett's esophagus.  Discussed risk and benefits of chronic PPI use, she wishes to continue current dose, she can follow-up with me as needed  StCarolina CellarMD [facility]

## 2019-09-21 NOTE — Patient Instructions (Addendum)
If you are age 70 or older, your body mass index should be between 23-30. Your Body mass index is 32.95 kg/m. If this is out of the aforementioned range listed, please consider follow up with your Primary Care Provider.  If you are age 5 or younger, your body mass index should be between 19-25. Your Body mass index is 32.95 kg/m. If this is out of the aformentioned range listed, please consider follow up with your Primary Care Provider.   To help prevent the possible spread of infection to our patients, communities, and staff; we will be implementing the following measures:  As of now we are not allowing any visitors/family members to accompany you to any upcoming appointments with [facility]. If you have any concerns about this please contact our office to discuss prior to the appointment.   We have sent the following medications to your pharmacy for you to pick up at your convenience: Nortriptyline 10mg /5 ml: Take 15mg  (7.5 ml) daily at bedtime.  We are giving you samples of IBgard today.  Take as directed.  If they are helpful you can purchase them over the counter.  Thank you for entrusting me with your care and for choosing [facility], Dr. [place] Cellar

## 2019-09-22 ENCOUNTER — Ambulatory Visit: Payer: Medicare Other | Admitting: Neurology

## 2019-09-22 ENCOUNTER — Telehealth: Payer: Self-pay | Admitting: Gastroenterology

## 2019-09-22 ENCOUNTER — Encounter: Payer: Self-pay | Admitting: Neurology

## 2019-09-22 ENCOUNTER — Encounter: Payer: Self-pay | Admitting: *Deleted

## 2019-09-22 VITALS — BP 122/70 | HR 92 | Temp 97.8°F | Ht 61.0 in | Wt 176.0 lb

## 2019-09-22 DIAGNOSIS — R200 Anesthesia of skin: Secondary | ICD-10-CM

## 2019-09-22 NOTE — Patient Instructions (Signed)
Carpal Tunnel Syndrome  Carpal tunnel syndrome is a condition that causes pain in your hand and arm. The carpal tunnel is a narrow area located on the palm side of your wrist. Repeated wrist motion or certain diseases may cause swelling within the tunnel. This swelling pinches the main nerve in the wrist (median nerve). What are the causes? This condition may be caused by:  Repeated wrist motions.  Wrist injuries.  Arthritis.  A cyst or tumor in the carpal tunnel.  Fluid buildup during pregnancy. Sometimes the cause of this condition is not known. What increases the risk? The following factors may make you more likely to develop this condition:  Having a job, such as being a butcher or a cashier, that requires you to repeatedly move your wrist in the same motion.  Being a woman.  Having certain conditions, such as: ? Diabetes. ? Obesity. ? An underactive thyroid (hypothyroidism). ? Kidney failure. What are the signs or symptoms? Symptoms of this condition include:  A tingling feeling in your fingers, especially in your thumb, index, and middle fingers.  Tingling or numbness in your hand.  An aching feeling in your entire arm, especially when your wrist and elbow are bent for a long time.  Wrist pain that goes up your arm to your shoulder.  Pain that goes down into your palm or fingers.  A weak feeling in your hands. You may have trouble grabbing and holding items. Your symptoms may feel worse during the night. How is this diagnosed? This condition is diagnosed with a medical history and physical exam. You may also have tests, including:  Electromyogram (EMG). This test measures electrical signals sent by your nerves into the muscles.  Nerve conduction study. This test measures how well electrical signals pass through your nerves.  Imaging tests, such as X-rays, ultrasound, and MRI. These tests check for possible causes of your condition. How is this treated? This  condition may be treated with:  Lifestyle changes. It is important to stop or change the activity that caused your condition.  Doing exercise and activities to strengthen your muscles and bones (physical therapy).  Learning how to use your hand again after diagnosis (occupational therapy).  Medicines for pain and inflammation. This may include medicine that is injected into your wrist.  A wrist splint.  Surgery. Follow these instructions at home: If you have a splint:  Wear the splint as told by your health care provider. Remove it only as told by your health care provider.  Loosen the splint if your fingers tingle, become numb, or turn cold and blue.  Keep the splint clean.  If the splint is not waterproof: ? Do not let it get wet. ? Cover it with a watertight covering when you take a bath or shower. Managing pain, stiffness, and swelling   If directed, put ice on the painful area: ? If you have a removable splint, remove it as told by your health care provider. ? Put ice in a plastic bag. ? Place a towel between your skin and the bag. ? Leave the ice on for 20 minutes, 2-3 times per day. General instructions  Take over-the-counter and prescription medicines only as told by your health care provider.  Rest your wrist from any activity that may be causing your pain. If your condition is work related, talk with your employer about changes that can be made, such as getting a wrist pad to use while typing.  Do any exercises as told   by your health care provider, physical therapist, or occupational therapist.  Keep all follow-up visits as told by your health care provider. This is important. Contact a health care provider if:  You have new symptoms.  Your pain is not controlled with medicines.  Your symptoms get worse. Get help right away if:  You have severe numbness or tingling in your wrist or hand. Summary  Carpal tunnel syndrome is a condition that causes pain in  your hand and arm.  It is usually caused by repeated wrist motions.  Lifestyle changes and medicines are used to treat carpal tunnel syndrome. Surgery may be recommended.  Follow your health care provider's instructions about wearing a splint, resting from activity, keeping follow-up visits, and calling for help. This information is not intended to replace advice given to you by your health care provider. Make sure you discuss any questions you have with your health care provider. Document Released: 11/15/2000 Document Revised: 03/27/2018 Document Reviewed: 03/27/2018 Elsevier Patient Education  2020 [employer]  

## 2019-09-22 NOTE — Telephone Encounter (Signed)
Labs arrived from PCP  Done 08/10/19: Normal LFTs and renal function WBC 5.2, Hgb 12.7, plt 178, MCV 93  Labs stable and reassuring, proceed with plan as outlined in clinic

## 2019-09-22 NOTE — Progress Notes (Signed)
[facility]    Provider:  Dr Ahern Requesting Provider: Howard, Kevin, MD Primary Care Provider:  Howard, Kevin, MD  CC:  Numbness and tingling right arm  HPI:  Morgan Rowland is a 68 y.o. female here as requested by Howard, Kevin, MD for sensory changes in the hands. Symptoms started 1.5 years ago. Started with crocheting and started with numbness in the hands at night, worse night, worse with use, better splint but she didn't have a good one, Dr. Roy retired, she saw Dr. Graves with her knee, hand goes completely numb and wakes her up and better with moving hand around, not painful just aggravating, right >> left. No other focal neurologic deficits, associated symptoms, inciting events or modifiable factors.  Reviewed notes, labs and imaging from outside physicians, which showed:  No labwork seen, will request from pcp  Personally reviewed images mri c-spine and agree: Unchanged since 2019.  Degenerative changes are most notable at C5-C6 where disc space loss, circumferential disc bulge, endplate spurring, and mild posterior element hypertrophy combine for mild spinal stenosis. There is up to mild associated spinal cord mass effect and moderate to severe bilateral C6 foraminal stenosis.  IMPRESSION: 1. Unchanged C5 vertebral body appearance since July 2019. In conjunction with the prior bone scan findings this is most compatible with a Benign Hemangioma of bone which has atypical signal. 2. Stable MRI appearance of the cervical spine since 2019 including degenerative findings which are most pronounced at C5-C6.  Review of Systems: Patient complains of symptoms per HPI as well as the following symptoms hand numbness. Pertinent negatives and positives per HPI. All others negative.   Social History   Socioeconomic History  . Marital status: Married    Spouse name: Not on file  . Number of children: Not on file  . Years of education: Not on file  .  Highest education level: Not on file  Occupational History  . Not on file  Social Needs  . Financial resource strain: Not on file  . Food insecurity    Worry: Not on file    Inability: Not on file  . Transportation needs    Medical: Not on file    Non-medical: Not on file  Tobacco Use  . Smoking status: Never Smoker  . Smokeless tobacco: Never Used  Substance and Sexual Activity  . Alcohol use: No  . Drug use: No  . Sexual activity: Not on file  Lifestyle  . Physical activity    Days per week: Not on file    Minutes per session: Not on file  . Stress: Not on file  Relationships  . Social connections    Talks on phone: Not on file    Gets together: Not on file    Attends religious service: Not on file    Active member of club or organization: Not on file    Attends meetings of clubs or organizations: Not on file    Relationship status: Not on file  . Intimate partner violence    Fear of current or ex partner: Not on file    Emotionally abused: Not on file    Physically abused: Not on file    Forced sexual activity: Not on file  Other Topics Concern  . Not on file  Social History Narrative   Lives at home with spouse   Right handed   Caffeine: 2 cups/day    Family History  Problem Relation Age of Onset  . Throat cancer   Brother   . Colon cancer Neg Hx   . Esophageal cancer Neg Hx   . Stomach cancer Neg Hx   . Rectal cancer Neg Hx     Past Medical History:  Diagnosis Date  . Cervical disc disease   . Chronic pain syndrome   . Constipation 04/21/2017  . Diverticulitis   . External hemorrhoids   . GERD (gastroesophageal reflux disease)   . Hemangioma    cervical; MRI 01/2019 Dr. Glenna Fellows  . High cholesterol   . Hypercholesteremia   . IBS (irritable bowel syndrome)   . Lumbar spondylosis with myelopathy   . Lumbosacral pain, chronic   . Numbness and tingling of right arm   . Osteoporosis   . Parotid tumor     Patient Active Problem List   Diagnosis  Date Noted  . Constipation 04/21/2017  . Diverticulitis 03/02/2012  . High cholesterol 03/02/2012    Past Surgical History:  Procedure Laterality Date  . APPENDECTOMY    . CAROTID ENDARTERECTOMY    . CHOLECYSTECTOMY    . COLONOSCOPY  2011   Dr Laural Golden in [place]     Left 12/2016, 08/2017  . Spinal fusion x 2    . TONSILLECTOMY    . TOTAL ABDOMINAL HYSTERECTOMY      Current Outpatient Medications  Medication Sig Dispense Refill  . aspirin 81 MG tablet Take 81 mg by mouth daily.    Marland Kitchen atorvastatin (LIPITOR) 10 MG tablet Take 10 mg by mouth daily.    Marland Kitchen dicyclomine (BENTYL) 10 MG capsule Take 1-2 capsules (10-20 mg total) by mouth every 8 (eight) hours as needed for spasms. 90 capsule 3  . HYDROcodone-acetaminophen (NORCO) 10-325 MG per tablet Take 1 tablet by mouth every 6 (six) hours as needed.    Marland Kitchen lisinopril (PRINIVIL,ZESTRIL) 20 MG tablet Take 20 mg by mouth daily.    Marland Kitchen LORazepam (ATIVAN) 2 MG tablet Take 2 mg by mouth at bedtime.    . Multiple Vitamins-Minerals (MULTIVITAMIN WITH MINERALS) tablet Take 1 tablet by mouth daily.    Marland Kitchen NARCAN 4 MG/0.1ML LIQD nasal spray kit USE 1 SPRAY AS NEEDED FOR ACCIDENTAL OVERDOSE    . omeprazole (PRILOSEC) 20 MG capsule Take 1 capsule (20 mg total) by mouth 2 (two) times daily before a meal. 60 capsule 5  . Peppermint Oil (IBGARD) 90 MG CPCR Take 2 capsules by mouth as needed. Take as directed as needed    . raloxifene (EVISTA) 60 MG tablet Take 60 mg by mouth daily.    . valACYclovir (VALTREX) 500 MG tablet Take 500 mg by mouth every other day.    . nortriptyline (PAMELOR) 10 MG/5ML solution Take 7.5 mLs (15 mg total) by mouth at bedtime. (Patient not taking: Reported on 09/22/2019) 240 mL 6   Current Facility-Administered Medications  Medication Dose Route Frequency Provider Last Rate Last Dose  . 0.9 %  sodium chloride infusion  500 mL Intravenous Once Armbruster, Carlota Raspberry, MD        Allergies as of 09/22/2019 - Review  Complete 09/22/2019  Allergen Reaction Noted  . Sulfa antibiotics  03/02/2012    Vitals: BP 122/70 (BP Location: Right Arm, Patient Position: Sitting)   Pulse 92   Temp 97.8 F (36.6 C) Comment: taken at home  Ht 5' 1" (1.549 m)   Wt 176 lb (79.8 kg)   BMI 33.25 kg/m  Last Weight:  Wt Readings from Last 1 Encounters:  09/22/19 176  lb (79.8 kg)   Last Height:   Ht Readings from Last 1 Encounters:  09/22/19 5' 1" (1.549 m)     Physical exam: Exam: Gen: NAD, conversant, well nourised, obese, well groomed                     CV: RRR, no MRG. No Carotid Bruits. No peripheral edema, warm, nontender Eyes: Conjunctivae clear without exudates or hemorrhage  Neuro: Detailed Neurologic Exam  Speech:    Speech is normal; fluent and spontaneous with normal comprehension.  Cognition:    The patient is oriented to person, place, and time;     recent and remote memory intact;     language fluent;     normal attention, concentration,     fund of knowledge Cranial Nerves:    The pupils are equal, round, and reactive to light. The fundi are normal and spontaneous venous pulsations are present. Visual fields are full to finger confrontation. Extraocular movements are intact. Trigeminal sensation is intact and the muscles of mastication are normal. The face is symmetric. The palate elevates in the midline. Hearing intact. Voice is normal. Shoulder shrug is normal. The tongue has normal motion without fasciculations.   Coordination:    Normal finger to nose and heel to shin. Normal rapid alternating movements.   Gait:    Normal native gait  Motor Observation:    No asymmetry, no atrophy, and no involuntary movements noted. Tone:    Normal muscle tone.    Posture:    Posture is normal. normal erect    Strength: possibly mild right opponens pollicis weakness otherwise strength is V/V in the upper and lower limbs.      Sensation: intact to LT     Reflex Exam:  DTR's:    Deep  tendon reflexes in the upper and lower extremities are symmetrical bilaterally.   Toes:    The toes are downgoing bilaterally.   Clonus:    Clonus is absent.    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 +Mcphalen's maneuver. R>>L. EMG/NCS tomorrow.  Orders Placed This Encounter  Procedures  . NCV with EMG(electromyography)    Cc: Howard, Kevin, MD,    Antonia Ahern, MD  [facility] [address] [place], [place] [address]  Phone [phone] Fax [phone]  

## 2019-09-23 ENCOUNTER — Ambulatory Visit (INDEPENDENT_AMBULATORY_CARE_PROVIDER_SITE_OTHER): Payer: Medicare Other | Admitting: Neurology

## 2019-09-23 ENCOUNTER — Other Ambulatory Visit: Payer: Self-pay

## 2019-09-23 ENCOUNTER — Ambulatory Visit: Payer: Medicare Other | Admitting: Neurology

## 2019-09-23 ENCOUNTER — Encounter: Payer: Self-pay | Admitting: Neurology

## 2019-09-23 DIAGNOSIS — G5603 Carpal tunnel syndrome, bilateral upper limbs: Secondary | ICD-10-CM

## 2019-09-23 DIAGNOSIS — R200 Anesthesia of skin: Secondary | ICD-10-CM | POA: Diagnosis not present

## 2019-09-23 DIAGNOSIS — Z0289 Encounter for other administrative examinations: Secondary | ICD-10-CM

## 2019-09-27 DIAGNOSIS — Z1231 Encounter for screening mammogram for malignant neoplasm of breast: Secondary | ICD-10-CM | POA: Diagnosis not present

## 2019-09-27 NOTE — Progress Notes (Signed)
Full Name: Morgan Rowland Gender: Female MRN #: [id number] Date of Birth: March 05, 1951    Visit Date: 09/23/2019 14:41 Age: 68 Years 23 Months Old Examining Physician: Sarina Ill, MD  Referring Physician: Rory Percy, MD    History:  Patient here for evaluation of numbness and tingling right > left hand. Likely CTS, +Mcphalen's maneuver.   Summary: Nerve Conduction Studies were performed on the bilateral upper extremities. The right Median 2nd Digit orthodromic sensory nerve showed prolonged distal peak latency (3.6 ms, N<3.4) and reduced amplitude(8 uV, N>10).The right median/ulnar (palm) comparison nerve showed prolonged distal peak latency (Median Palm, 2.4 ms, N<2.2) and abnormal peak latency difference (Median Palm-Ulnar Palm, 0.7 ms, N<0.4) with a relative median delay.  The left median/ulnar (palm) comparison nerve showed prolonged distal peak latency (Median Palm, 2.2 ms, N<2.2) and abnormal peak latency difference (Median Palm-Ulnar Palm, 0.5 ms, N<0.4) with a relative median delay.  All remaining nerves (as indicated in the following tables) were within normal limits.  All muscles (as indicated in the following tables) were within normal limits.    Conclusion: 1 There is electrophysiologic evidence for right mild Carpal Tunnel Syndrome. 2. 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 3. No suggestion of radiculopathy.   Sarina Ill M.D.  [facility] [place], [place] [address] Tel: [phone] Fax: [phone]   cc: Rory Percy, MD     [facility]    Nerve / Sites Muscle Latency Ref. Amplitude Ref. Rel Amp Segments Distance Velocity Ref. Area    ms ms mV mV %  cm m/s m/s mVms  R Median - APB     Wrist APB 4.0 ?4.4 7.0 ?4.0 100 Wrist - APB 7   24.0     Upper  arm APB 7.3  6.5  91.5 Upper arm - Wrist 19 56 ?49 22.1  L Median - APB     Wrist APB 3.9 ?4.4 6.7 ?4.0 100 Wrist - APB 7   24.8     Upper arm APB 7.5  6.4  95.9 Upper arm - Wrist 18 50 ?49 24.0  R Ulnar - ADM     Wrist ADM 2.5 ?3.3 9.9 ?6.0 100 Wrist - ADM 7   23.3     B.Elbow ADM 5.4  9.8  99 B.Elbow - Wrist 18 63 ?49 23.9     A.Elbow ADM 7.0  9.6  97.4 A.Elbow - B.Elbow 10 60 ?49 22.9         A.Elbow - Wrist               SNC    Nerve / Sites Rec. Site Peak Lat Ref.  Amp Ref. Segments Distance Peak Diff Ref.    ms ms V V  cm ms ms  R Median, Ulnar - Transcarpal comparison     Median Palm Wrist 2.4 ?2.2 38 ?35 Median Palm - Wrist 8       Ulnar Palm Wrist 1.7 ?2.2 18 ?12 Ulnar Palm - Wrist 8          Median Palm - Ulnar Palm  0.7 ?0.4  L Median, Ulnar - Transcarpal comparison     Median Palm Wrist 2.2 ?2.2 57 ?35 Median Palm - Wrist 8       Ulnar Palm Wrist 1.8 ?2.2 30 ?12 Ulnar  Palm - Wrist 8          Median Palm - Ulnar Palm  0.5 ?0.4  R Median - Orthodromic (Dig II, Mid palm)     Dig II Wrist 3.6 ?3.4 8 ?10 Dig II - Wrist 13    L Median - Orthodromic (Dig II, Mid palm)     Dig II Wrist 3.2 ?3.4 11 ?10 Dig II - Wrist 13    R Ulnar - Orthodromic, (Dig V, Mid palm)     Dig V Wrist 2.7 ?3.1 9 ?5 Dig V - Wrist 83                 F  Wave    Nerve F Lat Ref.   ms ms  R Ulnar - ADM 25.6 ?32.0       EMG full       EMG Summary Table    Spontaneous MUAP Recruitment  Muscle IA Fib PSW Fasc Other Amp Dur. Poly Pattern  R. Deltoid Normal None None None _______ Normal Normal Normal Normal  R. Triceps brachii Normal None None None _______ Normal Normal Normal Normal  R. Pronator teres Normal None None None _______ Normal Normal Normal Normal  R. First dorsal interosseous Normal None None None _______ Normal Normal Normal Normal  R. Opponens pollicis Normal None None None _______ Normal Normal Normal Normal  R. Cervical paraspinals (low) Normal None None None _______ Normal Normal  Normal Normal

## 2019-09-27 NOTE — Progress Notes (Signed)
See procedure note.

## 2019-09-27 NOTE — Procedures (Signed)
Full Name: Morgan Rowland Gender: Female MRN #: [id number] Date of Birth: [birth year]    Visit Date: 09/23/2019 14:41 Age: 68 Years 8 Months Old Examining Physician: Sarina Ill, MD  Referring Physician: Rory Percy, MD    History:  Patient here for evaluation of numbness and tingling right > left hand. Likely CTS, +Mcphalen's maneuver.   Summary: Nerve Conduction Studies were performed on the bilateral upper extremities. The right Median 2nd Digit orthodromic sensory nerve showed prolonged distal peak latency (3.6 ms, N<3.4) and reduced amplitude(8 uV, N>10).The right median/ulnar (palm) comparison nerve showed prolonged distal peak latency (Median Palm, 2.4 ms, N<2.2) and abnormal peak latency difference (Median Palm-Ulnar Palm, 0.7 ms, N<0.4) with a relative median delay.  The left median/ulnar (palm) comparison nerve showed prolonged distal peak latency (Median Palm, 2.2 ms, N<2.2) and abnormal peak latency difference (Median Palm-Ulnar Palm, 0.5 ms, N<0.4) with a relative median delay.  All remaining nerves (as indicated in the following tables) were within normal limits.  All muscles (as indicated in the following tables) were within normal limits.    Conclusion: 1 There is electrophysiologic evidence for right mild Carpal Tunnel Syndrome. 2. 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 3. No suggestion of radiculopathy.   Sarina Ill M.D.  [facility] [place], [place] [address] Tel: [phone] Fax: [phone]   cc: Rory Percy, MD     [facility]    Nerve / Sites Muscle Latency Ref. Amplitude Ref. Rel Amp Segments Distance Velocity Ref. Area    ms ms mV mV %  cm m/s m/s mVms  R Median - APB     Wrist APB 4.0 ?4.4 7.0 ?4.0 100 Wrist - APB 7   24.0     Upper  arm APB 7.3  6.5  91.5 Upper arm - Wrist 19 56 ?49 22.1  L Median - APB     Wrist APB 3.9 ?4.4 6.7 ?4.0 100 Wrist - APB 7   24.8     Upper arm APB 7.5  6.4  95.9 Upper arm - Wrist 18 50 ?49 24.0  R Ulnar - ADM     Wrist ADM 2.5 ?3.3 9.9 ?6.0 100 Wrist - ADM 7   23.3     B.Elbow ADM 5.4  9.8  99 B.Elbow - Wrist 18 63 ?49 23.9     A.Elbow ADM 7.0  9.6  97.4 A.Elbow - B.Elbow 10 60 ?49 22.9         A.Elbow - Wrist               SNC    Nerve / Sites Rec. Site Peak Lat Ref.  Amp Ref. Segments Distance Peak Diff Ref.    ms ms V V  cm ms ms  R Median, Ulnar - Transcarpal comparison     Median Palm Wrist 2.4 ?2.2 38 ?35 Median Palm - Wrist 8       Ulnar Palm Wrist 1.7 ?2.2 18 ?12 Ulnar Palm - Wrist 8          Median Palm - Ulnar Palm  0.7 ?0.4  L Median, Ulnar - Transcarpal comparison     Median Palm Wrist 2.2 ?2.2 57 ?35 Median Palm - Wrist 8       Ulnar Palm Wrist 1.8 ?2.2 30 ?12 Ulnar  Palm - Wrist 8          Median Palm - Ulnar Palm  0.5 ?0.4  R Median - Orthodromic (Dig II, Mid palm)     Dig II Wrist 3.6 ?3.4 8 ?10 Dig II - Wrist 13    L Median - Orthodromic (Dig II, Mid palm)     Dig II Wrist 3.2 ?3.4 11 ?10 Dig II - Wrist 13    R Ulnar - Orthodromic, (Dig V, Mid palm)     Dig V Wrist 2.7 ?3.1 9 ?5 Dig V - Wrist 15                 F  Wave    Nerve F Lat Ref.   ms ms  R Ulnar - ADM 25.6 ?32.0       EMG full       EMG Summary Table    Spontaneous MUAP Recruitment  Muscle IA Fib PSW Fasc Other Amp Dur. Poly Pattern  R. Deltoid Normal None None None _______ Normal Normal Normal Normal  R. Triceps brachii Normal None None None _______ Normal Normal Normal Normal  R. Pronator teres Normal None None None _______ Normal Normal Normal Normal  R. First dorsal interosseous Normal None None None _______ Normal Normal Normal Normal  R. Opponens pollicis Normal None None None _______ Normal Normal Normal Normal  R. Cervical paraspinals (low) Normal None None None _______ Normal Normal  Normal Normal

## 2019-10-08 DIAGNOSIS — M4716 Other spondylosis with myelopathy, lumbar region: Secondary | ICD-10-CM | POA: Diagnosis not present

## 2019-10-08 DIAGNOSIS — G894 Chronic pain syndrome: Secondary | ICD-10-CM | POA: Diagnosis not present

## 2019-10-08 DIAGNOSIS — Z79899 Other long term (current) drug therapy: Secondary | ICD-10-CM | POA: Diagnosis not present

## 2019-10-12 DIAGNOSIS — Z0120 Encounter for dental examination and cleaning without abnormal findings: Secondary | ICD-10-CM | POA: Diagnosis not present

## 2019-11-03 DIAGNOSIS — M4716 Other spondylosis with myelopathy, lumbar region: Secondary | ICD-10-CM | POA: Diagnosis not present

## 2019-11-03 DIAGNOSIS — Z79899 Other long term (current) drug therapy: Secondary | ICD-10-CM | POA: Diagnosis not present

## 2019-11-03 DIAGNOSIS — G894 Chronic pain syndrome: Secondary | ICD-10-CM | POA: Diagnosis not present

## 2019-11-25 ENCOUNTER — Other Ambulatory Visit: Payer: Self-pay | Admitting: Gastroenterology

## 2019-12-02 DIAGNOSIS — I10 Essential (primary) hypertension: Secondary | ICD-10-CM | POA: Diagnosis not present

## 2019-12-02 DIAGNOSIS — G4700 Insomnia, unspecified: Secondary | ICD-10-CM | POA: Diagnosis not present

## 2019-12-03 HISTORY — PX: COLONOSCOPY: SHX174

## 2019-12-07 DIAGNOSIS — R5383 Other fatigue: Secondary | ICD-10-CM | POA: Diagnosis not present

## 2019-12-31 DIAGNOSIS — I10 Essential (primary) hypertension: Secondary | ICD-10-CM | POA: Diagnosis not present

## 2019-12-31 DIAGNOSIS — E7801 Familial hypercholesterolemia: Secondary | ICD-10-CM | POA: Diagnosis not present

## 2019-12-31 DIAGNOSIS — F419 Anxiety disorder, unspecified: Secondary | ICD-10-CM | POA: Diagnosis not present

## 2020-01-04 DIAGNOSIS — M4716 Other spondylosis with myelopathy, lumbar region: Secondary | ICD-10-CM | POA: Diagnosis not present

## 2020-01-04 DIAGNOSIS — G894 Chronic pain syndrome: Secondary | ICD-10-CM | POA: Diagnosis not present

## 2020-01-04 DIAGNOSIS — Z79899 Other long term (current) drug therapy: Secondary | ICD-10-CM | POA: Diagnosis not present

## 2020-01-12 DIAGNOSIS — M9901 Segmental and somatic dysfunction of cervical region: Secondary | ICD-10-CM | POA: Diagnosis not present

## 2020-01-12 DIAGNOSIS — M546 Pain in thoracic spine: Secondary | ICD-10-CM | POA: Diagnosis not present

## 2020-01-12 DIAGNOSIS — M9902 Segmental and somatic dysfunction of thoracic region: Secondary | ICD-10-CM | POA: Diagnosis not present

## 2020-01-12 DIAGNOSIS — M542 Cervicalgia: Secondary | ICD-10-CM | POA: Diagnosis not present

## 2020-01-14 DIAGNOSIS — M546 Pain in thoracic spine: Secondary | ICD-10-CM | POA: Diagnosis not present

## 2020-01-14 DIAGNOSIS — M9902 Segmental and somatic dysfunction of thoracic region: Secondary | ICD-10-CM | POA: Diagnosis not present

## 2020-01-14 DIAGNOSIS — M542 Cervicalgia: Secondary | ICD-10-CM | POA: Diagnosis not present

## 2020-01-14 DIAGNOSIS — M9901 Segmental and somatic dysfunction of cervical region: Secondary | ICD-10-CM | POA: Diagnosis not present

## 2020-01-17 DIAGNOSIS — M542 Cervicalgia: Secondary | ICD-10-CM | POA: Diagnosis not present

## 2020-01-17 DIAGNOSIS — M9902 Segmental and somatic dysfunction of thoracic region: Secondary | ICD-10-CM | POA: Diagnosis not present

## 2020-01-17 DIAGNOSIS — M9901 Segmental and somatic dysfunction of cervical region: Secondary | ICD-10-CM | POA: Diagnosis not present

## 2020-01-17 DIAGNOSIS — M546 Pain in thoracic spine: Secondary | ICD-10-CM | POA: Diagnosis not present

## 2020-01-19 DIAGNOSIS — M546 Pain in thoracic spine: Secondary | ICD-10-CM | POA: Diagnosis not present

## 2020-01-19 DIAGNOSIS — M9902 Segmental and somatic dysfunction of thoracic region: Secondary | ICD-10-CM | POA: Diagnosis not present

## 2020-01-19 DIAGNOSIS — M542 Cervicalgia: Secondary | ICD-10-CM | POA: Diagnosis not present

## 2020-01-19 DIAGNOSIS — M9901 Segmental and somatic dysfunction of cervical region: Secondary | ICD-10-CM | POA: Diagnosis not present

## 2020-01-21 DIAGNOSIS — M9902 Segmental and somatic dysfunction of thoracic region: Secondary | ICD-10-CM | POA: Diagnosis not present

## 2020-01-21 DIAGNOSIS — M546 Pain in thoracic spine: Secondary | ICD-10-CM | POA: Diagnosis not present

## 2020-01-21 DIAGNOSIS — M542 Cervicalgia: Secondary | ICD-10-CM | POA: Diagnosis not present

## 2020-01-21 DIAGNOSIS — M9901 Segmental and somatic dysfunction of cervical region: Secondary | ICD-10-CM | POA: Diagnosis not present

## 2020-01-26 DIAGNOSIS — M9901 Segmental and somatic dysfunction of cervical region: Secondary | ICD-10-CM | POA: Diagnosis not present

## 2020-01-26 DIAGNOSIS — M546 Pain in thoracic spine: Secondary | ICD-10-CM | POA: Diagnosis not present

## 2020-01-26 DIAGNOSIS — M9902 Segmental and somatic dysfunction of thoracic region: Secondary | ICD-10-CM | POA: Diagnosis not present

## 2020-01-26 DIAGNOSIS — M542 Cervicalgia: Secondary | ICD-10-CM | POA: Diagnosis not present

## 2020-01-28 DIAGNOSIS — E7849 Other hyperlipidemia: Secondary | ICD-10-CM | POA: Diagnosis not present

## 2020-01-28 DIAGNOSIS — I10 Essential (primary) hypertension: Secondary | ICD-10-CM | POA: Diagnosis not present

## 2020-01-31 ENCOUNTER — Other Ambulatory Visit: Payer: Self-pay | Admitting: Gastroenterology

## 2020-02-04 DIAGNOSIS — M545 Low back pain: Secondary | ICD-10-CM | POA: Diagnosis not present

## 2020-02-04 DIAGNOSIS — G8929 Other chronic pain: Secondary | ICD-10-CM | POA: Diagnosis not present

## 2020-02-04 DIAGNOSIS — G894 Chronic pain syndrome: Secondary | ICD-10-CM | POA: Diagnosis not present

## 2020-02-04 DIAGNOSIS — Z79899 Other long term (current) drug therapy: Secondary | ICD-10-CM | POA: Diagnosis not present

## 2020-02-23 DIAGNOSIS — M9901 Segmental and somatic dysfunction of cervical region: Secondary | ICD-10-CM | POA: Diagnosis not present

## 2020-02-23 DIAGNOSIS — M546 Pain in thoracic spine: Secondary | ICD-10-CM | POA: Diagnosis not present

## 2020-02-23 DIAGNOSIS — M9902 Segmental and somatic dysfunction of thoracic region: Secondary | ICD-10-CM | POA: Diagnosis not present

## 2020-02-23 DIAGNOSIS — M542 Cervicalgia: Secondary | ICD-10-CM | POA: Diagnosis not present

## 2020-02-25 DIAGNOSIS — Z6833 Body mass index (BMI) 33.0-33.9, adult: Secondary | ICD-10-CM | POA: Diagnosis not present

## 2020-02-25 DIAGNOSIS — R000 Tachycardia, unspecified: Secondary | ICD-10-CM | POA: Diagnosis not present

## 2020-02-25 DIAGNOSIS — I10 Essential (primary) hypertension: Secondary | ICD-10-CM | POA: Diagnosis not present

## 2020-03-02 DIAGNOSIS — G8929 Other chronic pain: Secondary | ICD-10-CM | POA: Diagnosis not present

## 2020-03-02 DIAGNOSIS — G894 Chronic pain syndrome: Secondary | ICD-10-CM | POA: Diagnosis not present

## 2020-03-02 DIAGNOSIS — M545 Low back pain: Secondary | ICD-10-CM | POA: Diagnosis not present

## 2020-03-02 DIAGNOSIS — Z79899 Other long term (current) drug therapy: Secondary | ICD-10-CM | POA: Diagnosis not present

## 2020-04-04 DIAGNOSIS — I10 Essential (primary) hypertension: Secondary | ICD-10-CM | POA: Diagnosis not present

## 2020-04-04 DIAGNOSIS — Z6833 Body mass index (BMI) 33.0-33.9, adult: Secondary | ICD-10-CM | POA: Diagnosis not present

## 2020-04-06 DIAGNOSIS — M545 Low back pain: Secondary | ICD-10-CM | POA: Diagnosis not present

## 2020-04-06 DIAGNOSIS — Z79899 Other long term (current) drug therapy: Secondary | ICD-10-CM | POA: Diagnosis not present

## 2020-04-06 DIAGNOSIS — G8929 Other chronic pain: Secondary | ICD-10-CM | POA: Diagnosis not present

## 2020-04-06 DIAGNOSIS — G894 Chronic pain syndrome: Secondary | ICD-10-CM | POA: Diagnosis not present

## 2020-04-13 DIAGNOSIS — K219 Gastro-esophageal reflux disease without esophagitis: Secondary | ICD-10-CM | POA: Diagnosis not present

## 2020-04-13 DIAGNOSIS — Z79899 Other long term (current) drug therapy: Secondary | ICD-10-CM | POA: Diagnosis not present

## 2020-04-13 DIAGNOSIS — Z882 Allergy status to sulfonamides status: Secondary | ICD-10-CM | POA: Diagnosis not present

## 2020-04-13 DIAGNOSIS — Z9049 Acquired absence of other specified parts of digestive tract: Secondary | ICD-10-CM | POA: Diagnosis not present

## 2020-04-13 DIAGNOSIS — Z7982 Long term (current) use of aspirin: Secondary | ICD-10-CM | POA: Diagnosis not present

## 2020-04-13 DIAGNOSIS — Z90710 Acquired absence of both cervix and uterus: Secondary | ICD-10-CM | POA: Diagnosis not present

## 2020-04-13 DIAGNOSIS — R112 Nausea with vomiting, unspecified: Secondary | ICD-10-CM | POA: Diagnosis not present

## 2020-04-13 DIAGNOSIS — M1990 Unspecified osteoarthritis, unspecified site: Secondary | ICD-10-CM | POA: Diagnosis not present

## 2020-04-19 DIAGNOSIS — M546 Pain in thoracic spine: Secondary | ICD-10-CM | POA: Diagnosis not present

## 2020-04-19 DIAGNOSIS — M9901 Segmental and somatic dysfunction of cervical region: Secondary | ICD-10-CM | POA: Diagnosis not present

## 2020-04-19 DIAGNOSIS — M9902 Segmental and somatic dysfunction of thoracic region: Secondary | ICD-10-CM | POA: Diagnosis not present

## 2020-04-19 DIAGNOSIS — M542 Cervicalgia: Secondary | ICD-10-CM | POA: Diagnosis not present

## 2020-05-02 ENCOUNTER — Other Ambulatory Visit: Payer: Self-pay | Admitting: Dermatology

## 2020-05-05 DIAGNOSIS — G8929 Other chronic pain: Secondary | ICD-10-CM | POA: Diagnosis not present

## 2020-05-05 DIAGNOSIS — M545 Low back pain: Secondary | ICD-10-CM | POA: Diagnosis not present

## 2020-05-05 DIAGNOSIS — G894 Chronic pain syndrome: Secondary | ICD-10-CM | POA: Diagnosis not present

## 2020-05-05 DIAGNOSIS — Z79899 Other long term (current) drug therapy: Secondary | ICD-10-CM | POA: Diagnosis not present

## 2020-05-31 DIAGNOSIS — K589 Irritable bowel syndrome without diarrhea: Secondary | ICD-10-CM | POA: Diagnosis not present

## 2020-05-31 DIAGNOSIS — I10 Essential (primary) hypertension: Secondary | ICD-10-CM | POA: Diagnosis not present

## 2020-05-31 DIAGNOSIS — N3001 Acute cystitis with hematuria: Secondary | ICD-10-CM | POA: Diagnosis not present

## 2020-06-30 DIAGNOSIS — K589 Irritable bowel syndrome without diarrhea: Secondary | ICD-10-CM | POA: Diagnosis not present

## 2020-06-30 DIAGNOSIS — N3001 Acute cystitis with hematuria: Secondary | ICD-10-CM | POA: Diagnosis not present

## 2020-06-30 DIAGNOSIS — I10 Essential (primary) hypertension: Secondary | ICD-10-CM | POA: Diagnosis not present

## 2020-07-05 DIAGNOSIS — Z79899 Other long term (current) drug therapy: Secondary | ICD-10-CM | POA: Diagnosis not present

## 2020-07-05 DIAGNOSIS — G8929 Other chronic pain: Secondary | ICD-10-CM | POA: Diagnosis not present

## 2020-07-05 DIAGNOSIS — G894 Chronic pain syndrome: Secondary | ICD-10-CM | POA: Diagnosis not present

## 2020-07-05 DIAGNOSIS — M545 Low back pain: Secondary | ICD-10-CM | POA: Diagnosis not present

## 2020-07-12 DIAGNOSIS — M542 Cervicalgia: Secondary | ICD-10-CM | POA: Diagnosis not present

## 2020-07-12 DIAGNOSIS — M9901 Segmental and somatic dysfunction of cervical region: Secondary | ICD-10-CM | POA: Diagnosis not present

## 2020-07-12 DIAGNOSIS — M9902 Segmental and somatic dysfunction of thoracic region: Secondary | ICD-10-CM | POA: Diagnosis not present

## 2020-07-12 DIAGNOSIS — M546 Pain in thoracic spine: Secondary | ICD-10-CM | POA: Diagnosis not present

## 2020-07-26 ENCOUNTER — Telehealth: Payer: Self-pay | Admitting: Gastroenterology

## 2020-07-26 NOTE — Telephone Encounter (Signed)
Patient reports that her symptoms of abdominal pain.  She is nauseated and "feel sick and malaise" She feels this is the same symptoms you tx her last year.  She reports that she was tx with doxycycline last year.  She denies any diarrhea, constipation, rectal bleeding or other GI complaints.  No appt with you or APP until October

## 2020-07-27 DIAGNOSIS — R1032 Left lower quadrant pain: Secondary | ICD-10-CM | POA: Diagnosis not present

## 2020-07-27 DIAGNOSIS — Z6833 Body mass index (BMI) 33.0-33.9, adult: Secondary | ICD-10-CM | POA: Diagnosis not present

## 2020-07-27 DIAGNOSIS — K5792 Diverticulitis of intestine, part unspecified, without perforation or abscess without bleeding: Secondary | ICD-10-CM | POA: Diagnosis not present

## 2020-07-27 NOTE — Telephone Encounter (Signed)
Patient reports that she is not nauseated "just sick all over".  She states she does not need the rx for zofran.  She will call back to schedule follow up if she feels she needs one.  She thanked me for the call.

## 2020-07-27 NOTE — Telephone Encounter (Signed)
Sorry to hear this. I last saw her in October, she has chronic abdominal discomfort for > 20 years, has been on nortriptyline at baseline and I had recommended adding IB gard, not sure if she is still taking that. I don't believe I have given her doxycycline for this, I may have given her a sample of rifaximin previously. Are these new symptoms or more of the same chronic issues? If she is not taking anything for nausea, if that is her issue, can try some Zofran. We can book her for next available, not sure if this is new she wishes to see her PCP in the interim?

## 2020-07-31 ENCOUNTER — Other Ambulatory Visit: Payer: Self-pay | Admitting: Gastroenterology

## 2020-08-01 DIAGNOSIS — I10 Essential (primary) hypertension: Secondary | ICD-10-CM | POA: Diagnosis not present

## 2020-08-01 DIAGNOSIS — N3001 Acute cystitis with hematuria: Secondary | ICD-10-CM | POA: Diagnosis not present

## 2020-08-01 DIAGNOSIS — K589 Irritable bowel syndrome without diarrhea: Secondary | ICD-10-CM | POA: Diagnosis not present

## 2020-08-04 ENCOUNTER — Other Ambulatory Visit: Payer: Self-pay | Admitting: Physician Assistant

## 2020-08-08 DIAGNOSIS — M1990 Unspecified osteoarthritis, unspecified site: Secondary | ICD-10-CM | POA: Diagnosis not present

## 2020-08-08 DIAGNOSIS — E7800 Pure hypercholesterolemia, unspecified: Secondary | ICD-10-CM | POA: Diagnosis not present

## 2020-08-08 DIAGNOSIS — E7801 Familial hypercholesterolemia: Secondary | ICD-10-CM | POA: Diagnosis not present

## 2020-08-08 DIAGNOSIS — R000 Tachycardia, unspecified: Secondary | ICD-10-CM | POA: Diagnosis not present

## 2020-08-08 DIAGNOSIS — R5383 Other fatigue: Secondary | ICD-10-CM | POA: Diagnosis not present

## 2020-08-11 DIAGNOSIS — M546 Pain in thoracic spine: Secondary | ICD-10-CM | POA: Diagnosis not present

## 2020-08-11 DIAGNOSIS — M9901 Segmental and somatic dysfunction of cervical region: Secondary | ICD-10-CM | POA: Diagnosis not present

## 2020-08-11 DIAGNOSIS — M9902 Segmental and somatic dysfunction of thoracic region: Secondary | ICD-10-CM | POA: Diagnosis not present

## 2020-08-11 DIAGNOSIS — M542 Cervicalgia: Secondary | ICD-10-CM | POA: Diagnosis not present

## 2020-08-16 DIAGNOSIS — I10 Essential (primary) hypertension: Secondary | ICD-10-CM | POA: Diagnosis not present

## 2020-08-16 DIAGNOSIS — Z6834 Body mass index (BMI) 34.0-34.9, adult: Secondary | ICD-10-CM | POA: Diagnosis not present

## 2020-08-16 DIAGNOSIS — K589 Irritable bowel syndrome without diarrhea: Secondary | ICD-10-CM | POA: Diagnosis not present

## 2020-08-16 DIAGNOSIS — G4700 Insomnia, unspecified: Secondary | ICD-10-CM | POA: Diagnosis not present

## 2020-08-31 DIAGNOSIS — K589 Irritable bowel syndrome without diarrhea: Secondary | ICD-10-CM | POA: Diagnosis not present

## 2020-08-31 DIAGNOSIS — N3001 Acute cystitis with hematuria: Secondary | ICD-10-CM | POA: Diagnosis not present

## 2020-08-31 DIAGNOSIS — I10 Essential (primary) hypertension: Secondary | ICD-10-CM | POA: Diagnosis not present

## 2020-09-04 ENCOUNTER — Emergency Department (HOSPITAL_COMMUNITY)
Admission: EM | Admit: 2020-09-04 | Discharge: 2020-09-04 | Discharge status: Against Medical Advice | Payer: Medicare Other | Attending: Emergency Medicine | Admitting: Emergency Medicine

## 2020-09-04 ENCOUNTER — Other Ambulatory Visit: Payer: Self-pay

## 2020-09-04 ENCOUNTER — Encounter (HOSPITAL_COMMUNITY): Payer: Self-pay | Admitting: Emergency Medicine

## 2020-09-04 DIAGNOSIS — I10 Essential (primary) hypertension: Secondary | ICD-10-CM | POA: Diagnosis not present

## 2020-09-04 DIAGNOSIS — G894 Chronic pain syndrome: Secondary | ICD-10-CM | POA: Diagnosis not present

## 2020-09-04 DIAGNOSIS — M545 Low back pain, unspecified: Secondary | ICD-10-CM | POA: Diagnosis not present

## 2020-09-04 DIAGNOSIS — R000 Tachycardia, unspecified: Secondary | ICD-10-CM | POA: Diagnosis not present

## 2020-09-04 DIAGNOSIS — Z5321 Procedure and treatment not carried out due to patient leaving prior to being seen by health care provider: Secondary | ICD-10-CM | POA: Diagnosis not present

## 2020-09-04 DIAGNOSIS — Z79899 Other long term (current) drug therapy: Secondary | ICD-10-CM | POA: Diagnosis not present

## 2020-09-04 DIAGNOSIS — G8929 Other chronic pain: Secondary | ICD-10-CM | POA: Diagnosis not present

## 2020-09-04 NOTE — ED Triage Notes (Signed)
Pt had a steroid injection today in hip at PCP. Pt states she became hypertensive and tachycardic at home.

## 2020-09-04 NOTE — ED Notes (Signed)
Pt informed registration she was leaving  

## 2020-09-11 DIAGNOSIS — M9902 Segmental and somatic dysfunction of thoracic region: Secondary | ICD-10-CM | POA: Diagnosis not present

## 2020-09-11 DIAGNOSIS — Z6833 Body mass index (BMI) 33.0-33.9, adult: Secondary | ICD-10-CM | POA: Diagnosis not present

## 2020-09-11 DIAGNOSIS — M546 Pain in thoracic spine: Secondary | ICD-10-CM | POA: Diagnosis not present

## 2020-09-11 DIAGNOSIS — E7801 Familial hypercholesterolemia: Secondary | ICD-10-CM | POA: Diagnosis not present

## 2020-09-11 DIAGNOSIS — M542 Cervicalgia: Secondary | ICD-10-CM | POA: Diagnosis not present

## 2020-09-11 DIAGNOSIS — Z0000 Encounter for general adult medical examination without abnormal findings: Secondary | ICD-10-CM | POA: Diagnosis not present

## 2020-09-11 DIAGNOSIS — M9901 Segmental and somatic dysfunction of cervical region: Secondary | ICD-10-CM | POA: Diagnosis not present

## 2020-09-11 DIAGNOSIS — I10 Essential (primary) hypertension: Secondary | ICD-10-CM | POA: Diagnosis not present

## 2020-09-30 DIAGNOSIS — N3001 Acute cystitis with hematuria: Secondary | ICD-10-CM | POA: Diagnosis not present

## 2020-09-30 DIAGNOSIS — I10 Essential (primary) hypertension: Secondary | ICD-10-CM | POA: Diagnosis not present

## 2020-09-30 DIAGNOSIS — K589 Irritable bowel syndrome without diarrhea: Secondary | ICD-10-CM | POA: Diagnosis not present

## 2020-10-02 ENCOUNTER — Ambulatory Visit: Payer: Medicare Other | Admitting: Dermatology

## 2020-10-09 DIAGNOSIS — M9901 Segmental and somatic dysfunction of cervical region: Secondary | ICD-10-CM | POA: Diagnosis not present

## 2020-10-09 DIAGNOSIS — M9902 Segmental and somatic dysfunction of thoracic region: Secondary | ICD-10-CM | POA: Diagnosis not present

## 2020-10-09 DIAGNOSIS — M546 Pain in thoracic spine: Secondary | ICD-10-CM | POA: Diagnosis not present

## 2020-10-09 DIAGNOSIS — M542 Cervicalgia: Secondary | ICD-10-CM | POA: Diagnosis not present

## 2020-10-31 DIAGNOSIS — I10 Essential (primary) hypertension: Secondary | ICD-10-CM | POA: Diagnosis not present

## 2020-10-31 DIAGNOSIS — N3001 Acute cystitis with hematuria: Secondary | ICD-10-CM | POA: Diagnosis not present

## 2020-10-31 DIAGNOSIS — K589 Irritable bowel syndrome without diarrhea: Secondary | ICD-10-CM | POA: Diagnosis not present

## 2020-11-01 DIAGNOSIS — G8929 Other chronic pain: Secondary | ICD-10-CM | POA: Diagnosis not present

## 2020-11-01 DIAGNOSIS — Z79899 Other long term (current) drug therapy: Secondary | ICD-10-CM | POA: Diagnosis not present

## 2020-11-01 DIAGNOSIS — G894 Chronic pain syndrome: Secondary | ICD-10-CM | POA: Diagnosis not present

## 2020-11-01 DIAGNOSIS — M545 Low back pain, unspecified: Secondary | ICD-10-CM | POA: Diagnosis not present

## 2020-11-08 DIAGNOSIS — M542 Cervicalgia: Secondary | ICD-10-CM | POA: Diagnosis not present

## 2020-11-08 DIAGNOSIS — M9901 Segmental and somatic dysfunction of cervical region: Secondary | ICD-10-CM | POA: Diagnosis not present

## 2020-11-08 DIAGNOSIS — M546 Pain in thoracic spine: Secondary | ICD-10-CM | POA: Diagnosis not present

## 2020-11-08 DIAGNOSIS — M9902 Segmental and somatic dysfunction of thoracic region: Secondary | ICD-10-CM | POA: Diagnosis not present

## 2020-11-22 DIAGNOSIS — M546 Pain in thoracic spine: Secondary | ICD-10-CM | POA: Diagnosis not present

## 2020-11-22 DIAGNOSIS — M9901 Segmental and somatic dysfunction of cervical region: Secondary | ICD-10-CM | POA: Diagnosis not present

## 2020-11-22 DIAGNOSIS — M9902 Segmental and somatic dysfunction of thoracic region: Secondary | ICD-10-CM | POA: Diagnosis not present

## 2020-11-22 DIAGNOSIS — M542 Cervicalgia: Secondary | ICD-10-CM | POA: Diagnosis not present

## 2020-12-01 DIAGNOSIS — I10 Essential (primary) hypertension: Secondary | ICD-10-CM | POA: Diagnosis not present

## 2020-12-01 DIAGNOSIS — K589 Irritable bowel syndrome without diarrhea: Secondary | ICD-10-CM | POA: Diagnosis not present

## 2020-12-01 DIAGNOSIS — N3001 Acute cystitis with hematuria: Secondary | ICD-10-CM | POA: Diagnosis not present

## 2020-12-20 DIAGNOSIS — M546 Pain in thoracic spine: Secondary | ICD-10-CM | POA: Diagnosis not present

## 2020-12-20 DIAGNOSIS — M542 Cervicalgia: Secondary | ICD-10-CM | POA: Diagnosis not present

## 2020-12-20 DIAGNOSIS — M9901 Segmental and somatic dysfunction of cervical region: Secondary | ICD-10-CM | POA: Diagnosis not present

## 2020-12-20 DIAGNOSIS — M9902 Segmental and somatic dysfunction of thoracic region: Secondary | ICD-10-CM | POA: Diagnosis not present

## 2020-12-22 DIAGNOSIS — R1010 Upper abdominal pain, unspecified: Secondary | ICD-10-CM | POA: Diagnosis not present

## 2020-12-22 DIAGNOSIS — Z6829 Body mass index (BMI) 29.0-29.9, adult: Secondary | ICD-10-CM | POA: Diagnosis not present

## 2020-12-25 ENCOUNTER — Other Ambulatory Visit: Payer: Self-pay

## 2020-12-25 ENCOUNTER — Encounter: Payer: Self-pay | Admitting: Dermatology

## 2020-12-25 ENCOUNTER — Ambulatory Visit: Payer: Medicare Other | Admitting: Dermatology

## 2020-12-25 DIAGNOSIS — Z1283 Encounter for screening for malignant neoplasm of skin: Secondary | ICD-10-CM

## 2020-12-25 DIAGNOSIS — Z87898 Personal history of other specified conditions: Secondary | ICD-10-CM

## 2020-12-25 DIAGNOSIS — L821 Other seborrheic keratosis: Secondary | ICD-10-CM

## 2020-12-25 DIAGNOSIS — Z86018 Personal history of other benign neoplasm: Secondary | ICD-10-CM | POA: Diagnosis not present

## 2020-12-25 DIAGNOSIS — B001 Herpesviral vesicular dermatitis: Secondary | ICD-10-CM

## 2020-12-25 DIAGNOSIS — D1801 Hemangioma of skin and subcutaneous tissue: Secondary | ICD-10-CM | POA: Diagnosis not present

## 2020-12-25 MED ORDER — VALACYCLOVIR HCL 500 MG PO TABS: 500.0000 mg | ORAL_TABLET | Freq: Every day | ORAL | 0 refills | Status: DC

## 2020-12-26 ENCOUNTER — Encounter: Payer: Self-pay | Admitting: Dermatology

## 2020-12-26 NOTE — Progress Notes (Signed)
   Follow-Up Visit   Subjective  Morgan Rowland is a 70 y.o. female who presents for the following: Annual Exam (Refill valtrex).  General skin examination Location: Without no new suspicious spots Duration:  Quality:  Associated Signs/Symptoms: Modifying Factors:  Severity:  Timing: Context:   Objective  Well appearing patient in no apparent distress; mood and affect are within normal limits. Objective  Mid Back: Also under breast   Objective  Left Lower Leg - Anterior: Leg and abdomen with 3 to 4 mm smooth red papules  Objective  Left Lower Leg - Anterior: Left shin and left heel scar clear  Objective  Mid Upper [place] Lip: No active lesions today but history of recurrent HSV  Objective  Mid Back: No current skin cancer or atypical mole.   A full examination was performed including scalp, head, eyes, ears, nose, lips, neck, chest, axillae, abdomen, back, buttocks, bilateral upper extremities, bilateral lower extremities, hands, feet, fingers, toes, fingernails, and toenails. All findings within normal limits unless otherwise noted below.   Assessment & Plan    Screening exam for skin cancer Chest - Medial (Center)  Seborrheic keratosis Mid Back  Cherry angioma Left Lower Leg - Anterior  Leave if stable  History of atypical nevus Left Lower Leg - Anterior  Recheck as needed change  Fever blister Mid Upper Vermilion Lip  Refills on Valtrex  Skin exam for malignant neoplasm Mid Back  Annual skin examination.     I, Lavonna Monarch, MD, have reviewed all documentation for this visit.  The documentation on 12/26/20 for the exam, diagnosis, procedures, and orders are all accurate and complete.

## 2020-12-28 DIAGNOSIS — H52222 Regular astigmatism, left eye: Secondary | ICD-10-CM | POA: Diagnosis not present

## 2020-12-28 DIAGNOSIS — H5212 Myopia, left eye: Secondary | ICD-10-CM | POA: Diagnosis not present

## 2020-12-28 DIAGNOSIS — H524 Presbyopia: Secondary | ICD-10-CM | POA: Diagnosis not present

## 2020-12-28 DIAGNOSIS — H2513 Age-related nuclear cataract, bilateral: Secondary | ICD-10-CM | POA: Diagnosis not present

## 2020-12-30 DIAGNOSIS — K589 Irritable bowel syndrome without diarrhea: Secondary | ICD-10-CM | POA: Diagnosis not present

## 2020-12-30 DIAGNOSIS — I10 Essential (primary) hypertension: Secondary | ICD-10-CM | POA: Diagnosis not present

## 2020-12-30 DIAGNOSIS — N3001 Acute cystitis with hematuria: Secondary | ICD-10-CM | POA: Diagnosis not present

## 2021-01-01 DIAGNOSIS — M545 Low back pain, unspecified: Secondary | ICD-10-CM | POA: Diagnosis not present

## 2021-01-01 DIAGNOSIS — G894 Chronic pain syndrome: Secondary | ICD-10-CM | POA: Diagnosis not present

## 2021-01-01 DIAGNOSIS — Z79899 Other long term (current) drug therapy: Secondary | ICD-10-CM | POA: Diagnosis not present

## 2021-01-01 DIAGNOSIS — G8929 Other chronic pain: Secondary | ICD-10-CM | POA: Diagnosis not present

## 2021-01-04 DIAGNOSIS — M545 Low back pain, unspecified: Secondary | ICD-10-CM | POA: Diagnosis not present

## 2021-01-04 DIAGNOSIS — I10 Essential (primary) hypertension: Secondary | ICD-10-CM | POA: Diagnosis not present

## 2021-01-04 DIAGNOSIS — R519 Headache, unspecified: Secondary | ICD-10-CM | POA: Diagnosis not present

## 2021-01-04 DIAGNOSIS — Z79899 Other long term (current) drug therapy: Secondary | ICD-10-CM | POA: Diagnosis not present

## 2021-01-04 DIAGNOSIS — M542 Cervicalgia: Secondary | ICD-10-CM | POA: Diagnosis not present

## 2021-01-04 DIAGNOSIS — Z041 Encounter for examination and observation following transport accident: Secondary | ICD-10-CM | POA: Diagnosis not present

## 2021-01-04 DIAGNOSIS — K219 Gastro-esophageal reflux disease without esophagitis: Secondary | ICD-10-CM | POA: Diagnosis not present

## 2021-01-17 DIAGNOSIS — M9902 Segmental and somatic dysfunction of thoracic region: Secondary | ICD-10-CM | POA: Diagnosis not present

## 2021-01-17 DIAGNOSIS — M9901 Segmental and somatic dysfunction of cervical region: Secondary | ICD-10-CM | POA: Diagnosis not present

## 2021-01-17 DIAGNOSIS — M546 Pain in thoracic spine: Secondary | ICD-10-CM | POA: Diagnosis not present

## 2021-01-17 DIAGNOSIS — M542 Cervicalgia: Secondary | ICD-10-CM | POA: Diagnosis not present

## 2021-01-29 DIAGNOSIS — I10 Essential (primary) hypertension: Secondary | ICD-10-CM | POA: Diagnosis not present

## 2021-01-29 DIAGNOSIS — N3001 Acute cystitis with hematuria: Secondary | ICD-10-CM | POA: Diagnosis not present

## 2021-01-29 DIAGNOSIS — K589 Irritable bowel syndrome without diarrhea: Secondary | ICD-10-CM | POA: Diagnosis not present

## 2021-02-01 DIAGNOSIS — Z20822 Contact with and (suspected) exposure to covid-19: Secondary | ICD-10-CM | POA: Diagnosis not present

## 2021-02-01 DIAGNOSIS — K5732 Diverticulitis of large intestine without perforation or abscess without bleeding: Secondary | ICD-10-CM | POA: Diagnosis not present

## 2021-02-05 DIAGNOSIS — Z6829 Body mass index (BMI) 29.0-29.9, adult: Secondary | ICD-10-CM | POA: Diagnosis not present

## 2021-02-05 DIAGNOSIS — K589 Irritable bowel syndrome without diarrhea: Secondary | ICD-10-CM | POA: Diagnosis not present

## 2021-02-13 DIAGNOSIS — H02423 Myogenic ptosis of bilateral eyelids: Secondary | ICD-10-CM | POA: Diagnosis not present

## 2021-02-13 DIAGNOSIS — H02831 Dermatochalasis of right upper eyelid: Secondary | ICD-10-CM | POA: Diagnosis not present

## 2021-02-13 DIAGNOSIS — H02834 Dermatochalasis of left upper eyelid: Secondary | ICD-10-CM | POA: Diagnosis not present

## 2021-02-13 DIAGNOSIS — H02413 Mechanical ptosis of bilateral eyelids: Secondary | ICD-10-CM | POA: Diagnosis not present

## 2021-02-16 DIAGNOSIS — M542 Cervicalgia: Secondary | ICD-10-CM | POA: Diagnosis not present

## 2021-02-16 DIAGNOSIS — M546 Pain in thoracic spine: Secondary | ICD-10-CM | POA: Diagnosis not present

## 2021-02-16 DIAGNOSIS — M9902 Segmental and somatic dysfunction of thoracic region: Secondary | ICD-10-CM | POA: Diagnosis not present

## 2021-02-16 DIAGNOSIS — M9901 Segmental and somatic dysfunction of cervical region: Secondary | ICD-10-CM | POA: Diagnosis not present

## 2021-02-20 DIAGNOSIS — M8588 Other specified disorders of bone density and structure, other site: Secondary | ICD-10-CM | POA: Diagnosis not present

## 2021-02-20 DIAGNOSIS — Z780 Asymptomatic menopausal state: Secondary | ICD-10-CM | POA: Diagnosis not present

## 2021-02-20 DIAGNOSIS — M810 Age-related osteoporosis without current pathological fracture: Secondary | ICD-10-CM | POA: Diagnosis not present

## 2021-02-28 ENCOUNTER — Other Ambulatory Visit: Payer: Self-pay | Admitting: Dermatology

## 2021-02-28 DIAGNOSIS — H53483 Generalized contraction of visual field, bilateral: Secondary | ICD-10-CM | POA: Diagnosis not present

## 2021-02-28 DIAGNOSIS — N3001 Acute cystitis with hematuria: Secondary | ICD-10-CM | POA: Diagnosis not present

## 2021-02-28 DIAGNOSIS — I10 Essential (primary) hypertension: Secondary | ICD-10-CM | POA: Diagnosis not present

## 2021-02-28 DIAGNOSIS — K589 Irritable bowel syndrome without diarrhea: Secondary | ICD-10-CM | POA: Diagnosis not present

## 2021-03-02 DIAGNOSIS — M545 Low back pain, unspecified: Secondary | ICD-10-CM | POA: Diagnosis not present

## 2021-03-02 DIAGNOSIS — Z79899 Other long term (current) drug therapy: Secondary | ICD-10-CM | POA: Diagnosis not present

## 2021-03-02 DIAGNOSIS — E669 Obesity, unspecified: Secondary | ICD-10-CM | POA: Diagnosis not present

## 2021-03-02 DIAGNOSIS — G894 Chronic pain syndrome: Secondary | ICD-10-CM | POA: Diagnosis not present

## 2021-03-02 DIAGNOSIS — G8929 Other chronic pain: Secondary | ICD-10-CM | POA: Diagnosis not present

## 2021-03-13 DIAGNOSIS — F419 Anxiety disorder, unspecified: Secondary | ICD-10-CM | POA: Diagnosis not present

## 2021-03-13 DIAGNOSIS — K589 Irritable bowel syndrome without diarrhea: Secondary | ICD-10-CM | POA: Diagnosis not present

## 2021-03-13 DIAGNOSIS — Z1231 Encounter for screening mammogram for malignant neoplasm of breast: Secondary | ICD-10-CM | POA: Diagnosis not present

## 2021-03-21 DIAGNOSIS — M542 Cervicalgia: Secondary | ICD-10-CM | POA: Diagnosis not present

## 2021-03-21 DIAGNOSIS — M9901 Segmental and somatic dysfunction of cervical region: Secondary | ICD-10-CM | POA: Diagnosis not present

## 2021-03-21 DIAGNOSIS — M9902 Segmental and somatic dysfunction of thoracic region: Secondary | ICD-10-CM | POA: Diagnosis not present

## 2021-03-21 DIAGNOSIS — M546 Pain in thoracic spine: Secondary | ICD-10-CM | POA: Diagnosis not present

## 2021-03-31 DIAGNOSIS — N3001 Acute cystitis with hematuria: Secondary | ICD-10-CM | POA: Diagnosis not present

## 2021-03-31 DIAGNOSIS — I10 Essential (primary) hypertension: Secondary | ICD-10-CM | POA: Diagnosis not present

## 2021-03-31 DIAGNOSIS — K589 Irritable bowel syndrome without diarrhea: Secondary | ICD-10-CM | POA: Diagnosis not present

## 2021-04-18 DIAGNOSIS — M542 Cervicalgia: Secondary | ICD-10-CM | POA: Diagnosis not present

## 2021-04-18 DIAGNOSIS — M9902 Segmental and somatic dysfunction of thoracic region: Secondary | ICD-10-CM | POA: Diagnosis not present

## 2021-04-18 DIAGNOSIS — M546 Pain in thoracic spine: Secondary | ICD-10-CM | POA: Diagnosis not present

## 2021-04-18 DIAGNOSIS — M9901 Segmental and somatic dysfunction of cervical region: Secondary | ICD-10-CM | POA: Diagnosis not present

## 2021-04-30 DIAGNOSIS — I10 Essential (primary) hypertension: Secondary | ICD-10-CM | POA: Diagnosis not present

## 2021-04-30 DIAGNOSIS — K589 Irritable bowel syndrome without diarrhea: Secondary | ICD-10-CM | POA: Diagnosis not present

## 2021-04-30 DIAGNOSIS — N3001 Acute cystitis with hematuria: Secondary | ICD-10-CM | POA: Diagnosis not present

## 2021-05-01 ENCOUNTER — Other Ambulatory Visit: Payer: Self-pay | Admitting: Dermatology

## 2021-05-03 DIAGNOSIS — Z79899 Other long term (current) drug therapy: Secondary | ICD-10-CM | POA: Diagnosis not present

## 2021-05-03 DIAGNOSIS — G894 Chronic pain syndrome: Secondary | ICD-10-CM | POA: Diagnosis not present

## 2021-05-03 DIAGNOSIS — G8929 Other chronic pain: Secondary | ICD-10-CM | POA: Diagnosis not present

## 2021-05-03 DIAGNOSIS — M545 Low back pain, unspecified: Secondary | ICD-10-CM | POA: Diagnosis not present

## 2021-05-03 DIAGNOSIS — E669 Obesity, unspecified: Secondary | ICD-10-CM | POA: Diagnosis not present

## 2021-05-07 DIAGNOSIS — H02834 Dermatochalasis of left upper eyelid: Secondary | ICD-10-CM | POA: Diagnosis not present

## 2021-05-14 DIAGNOSIS — Z09 Encounter for follow-up examination after completed treatment for conditions other than malignant neoplasm: Secondary | ICD-10-CM | POA: Diagnosis not present

## 2021-05-14 DIAGNOSIS — L905 Scar conditions and fibrosis of skin: Secondary | ICD-10-CM | POA: Diagnosis not present

## 2021-05-14 DIAGNOSIS — T8131XA Disruption of external operation (surgical) wound, not elsewhere classified, initial encounter: Secondary | ICD-10-CM | POA: Diagnosis not present

## 2021-05-23 DIAGNOSIS — M546 Pain in thoracic spine: Secondary | ICD-10-CM | POA: Diagnosis not present

## 2021-05-23 DIAGNOSIS — M9901 Segmental and somatic dysfunction of cervical region: Secondary | ICD-10-CM | POA: Diagnosis not present

## 2021-05-23 DIAGNOSIS — M542 Cervicalgia: Secondary | ICD-10-CM | POA: Diagnosis not present

## 2021-05-23 DIAGNOSIS — M9902 Segmental and somatic dysfunction of thoracic region: Secondary | ICD-10-CM | POA: Diagnosis not present

## 2021-05-31 DIAGNOSIS — N3001 Acute cystitis with hematuria: Secondary | ICD-10-CM | POA: Diagnosis not present

## 2021-05-31 DIAGNOSIS — I10 Essential (primary) hypertension: Secondary | ICD-10-CM | POA: Diagnosis not present

## 2021-05-31 DIAGNOSIS — K589 Irritable bowel syndrome without diarrhea: Secondary | ICD-10-CM | POA: Diagnosis not present

## 2021-06-01 DIAGNOSIS — G894 Chronic pain syndrome: Secondary | ICD-10-CM | POA: Diagnosis not present

## 2021-06-01 DIAGNOSIS — Z79899 Other long term (current) drug therapy: Secondary | ICD-10-CM | POA: Diagnosis not present

## 2021-06-01 DIAGNOSIS — G8929 Other chronic pain: Secondary | ICD-10-CM | POA: Diagnosis not present

## 2021-06-01 DIAGNOSIS — M545 Low back pain, unspecified: Secondary | ICD-10-CM | POA: Diagnosis not present

## 2021-06-01 DIAGNOSIS — E669 Obesity, unspecified: Secondary | ICD-10-CM | POA: Diagnosis not present

## 2021-06-13 DIAGNOSIS — Z20828 Contact with and (suspected) exposure to other viral communicable diseases: Secondary | ICD-10-CM | POA: Diagnosis not present

## 2021-06-27 DIAGNOSIS — M9902 Segmental and somatic dysfunction of thoracic region: Secondary | ICD-10-CM | POA: Diagnosis not present

## 2021-06-27 DIAGNOSIS — M9901 Segmental and somatic dysfunction of cervical region: Secondary | ICD-10-CM | POA: Diagnosis not present

## 2021-06-27 DIAGNOSIS — M542 Cervicalgia: Secondary | ICD-10-CM | POA: Diagnosis not present

## 2021-06-27 DIAGNOSIS — M546 Pain in thoracic spine: Secondary | ICD-10-CM | POA: Diagnosis not present

## 2021-07-02 DIAGNOSIS — Z79899 Other long term (current) drug therapy: Secondary | ICD-10-CM | POA: Diagnosis not present

## 2021-07-02 DIAGNOSIS — E669 Obesity, unspecified: Secondary | ICD-10-CM | POA: Diagnosis not present

## 2021-07-02 DIAGNOSIS — M545 Low back pain, unspecified: Secondary | ICD-10-CM | POA: Diagnosis not present

## 2021-07-02 DIAGNOSIS — G8929 Other chronic pain: Secondary | ICD-10-CM | POA: Diagnosis not present

## 2021-07-02 DIAGNOSIS — G894 Chronic pain syndrome: Secondary | ICD-10-CM | POA: Diagnosis not present

## 2021-07-25 DIAGNOSIS — M9901 Segmental and somatic dysfunction of cervical region: Secondary | ICD-10-CM | POA: Diagnosis not present

## 2021-07-25 DIAGNOSIS — M542 Cervicalgia: Secondary | ICD-10-CM | POA: Diagnosis not present

## 2021-07-25 DIAGNOSIS — M546 Pain in thoracic spine: Secondary | ICD-10-CM | POA: Diagnosis not present

## 2021-07-25 DIAGNOSIS — M9902 Segmental and somatic dysfunction of thoracic region: Secondary | ICD-10-CM | POA: Diagnosis not present

## 2021-08-01 DIAGNOSIS — G894 Chronic pain syndrome: Secondary | ICD-10-CM | POA: Diagnosis not present

## 2021-08-01 DIAGNOSIS — M545 Low back pain, unspecified: Secondary | ICD-10-CM | POA: Diagnosis not present

## 2021-08-01 DIAGNOSIS — R030 Elevated blood-pressure reading, without diagnosis of hypertension: Secondary | ICD-10-CM | POA: Diagnosis not present

## 2021-08-01 DIAGNOSIS — Z79899 Other long term (current) drug therapy: Secondary | ICD-10-CM | POA: Diagnosis not present

## 2021-08-22 DIAGNOSIS — M542 Cervicalgia: Secondary | ICD-10-CM | POA: Diagnosis not present

## 2021-08-22 DIAGNOSIS — M546 Pain in thoracic spine: Secondary | ICD-10-CM | POA: Diagnosis not present

## 2021-08-22 DIAGNOSIS — M9901 Segmental and somatic dysfunction of cervical region: Secondary | ICD-10-CM | POA: Diagnosis not present

## 2021-08-22 DIAGNOSIS — M9902 Segmental and somatic dysfunction of thoracic region: Secondary | ICD-10-CM | POA: Diagnosis not present

## 2021-08-28 DIAGNOSIS — K5792 Diverticulitis of intestine, part unspecified, without perforation or abscess without bleeding: Secondary | ICD-10-CM | POA: Diagnosis not present

## 2021-08-31 DIAGNOSIS — R030 Elevated blood-pressure reading, without diagnosis of hypertension: Secondary | ICD-10-CM | POA: Diagnosis not present

## 2021-08-31 DIAGNOSIS — Z6832 Body mass index (BMI) 32.0-32.9, adult: Secondary | ICD-10-CM | POA: Diagnosis not present

## 2021-08-31 DIAGNOSIS — G894 Chronic pain syndrome: Secondary | ICD-10-CM | POA: Diagnosis not present

## 2021-08-31 DIAGNOSIS — Z79899 Other long term (current) drug therapy: Secondary | ICD-10-CM | POA: Diagnosis not present

## 2021-09-03 DIAGNOSIS — R5383 Other fatigue: Secondary | ICD-10-CM | POA: Diagnosis not present

## 2021-09-03 DIAGNOSIS — E7800 Pure hypercholesterolemia, unspecified: Secondary | ICD-10-CM | POA: Diagnosis not present

## 2021-09-03 DIAGNOSIS — I10 Essential (primary) hypertension: Secondary | ICD-10-CM | POA: Diagnosis not present

## 2021-09-03 DIAGNOSIS — E7801 Familial hypercholesterolemia: Secondary | ICD-10-CM | POA: Diagnosis not present

## 2021-09-03 DIAGNOSIS — Z1329 Encounter for screening for other suspected endocrine disorder: Secondary | ICD-10-CM | POA: Diagnosis not present

## 2021-09-06 DIAGNOSIS — G4700 Insomnia, unspecified: Secondary | ICD-10-CM | POA: Diagnosis not present

## 2021-09-06 DIAGNOSIS — I10 Essential (primary) hypertension: Secondary | ICD-10-CM | POA: Diagnosis not present

## 2021-09-06 DIAGNOSIS — K589 Irritable bowel syndrome without diarrhea: Secondary | ICD-10-CM | POA: Diagnosis not present

## 2021-09-06 DIAGNOSIS — Z0000 Encounter for general adult medical examination without abnormal findings: Secondary | ICD-10-CM | POA: Diagnosis not present

## 2021-09-12 DIAGNOSIS — F419 Anxiety disorder, unspecified: Secondary | ICD-10-CM | POA: Diagnosis not present

## 2021-09-12 DIAGNOSIS — I10 Essential (primary) hypertension: Secondary | ICD-10-CM | POA: Diagnosis not present

## 2021-09-12 DIAGNOSIS — E785 Hyperlipidemia, unspecified: Secondary | ICD-10-CM | POA: Diagnosis not present

## 2021-09-12 DIAGNOSIS — Z0001 Encounter for general adult medical examination with abnormal findings: Secondary | ICD-10-CM | POA: Diagnosis not present

## 2021-09-19 DIAGNOSIS — M546 Pain in thoracic spine: Secondary | ICD-10-CM | POA: Diagnosis not present

## 2021-09-19 DIAGNOSIS — M9902 Segmental and somatic dysfunction of thoracic region: Secondary | ICD-10-CM | POA: Diagnosis not present

## 2021-09-19 DIAGNOSIS — M542 Cervicalgia: Secondary | ICD-10-CM | POA: Diagnosis not present

## 2021-09-19 DIAGNOSIS — M9901 Segmental and somatic dysfunction of cervical region: Secondary | ICD-10-CM | POA: Diagnosis not present

## 2021-09-28 DIAGNOSIS — Z79899 Other long term (current) drug therapy: Secondary | ICD-10-CM | POA: Diagnosis not present

## 2021-09-28 DIAGNOSIS — Z6832 Body mass index (BMI) 32.0-32.9, adult: Secondary | ICD-10-CM | POA: Diagnosis not present

## 2021-09-28 DIAGNOSIS — R030 Elevated blood-pressure reading, without diagnosis of hypertension: Secondary | ICD-10-CM | POA: Diagnosis not present

## 2021-09-28 DIAGNOSIS — G894 Chronic pain syndrome: Secondary | ICD-10-CM | POA: Diagnosis not present

## 2021-10-01 DIAGNOSIS — G8929 Other chronic pain: Secondary | ICD-10-CM | POA: Diagnosis not present

## 2021-10-01 DIAGNOSIS — K589 Irritable bowel syndrome without diarrhea: Secondary | ICD-10-CM | POA: Diagnosis not present

## 2021-10-01 DIAGNOSIS — N3001 Acute cystitis with hematuria: Secondary | ICD-10-CM | POA: Diagnosis not present

## 2021-10-01 DIAGNOSIS — R1030 Lower abdominal pain, unspecified: Secondary | ICD-10-CM | POA: Diagnosis not present

## 2021-10-01 DIAGNOSIS — I10 Essential (primary) hypertension: Secondary | ICD-10-CM | POA: Diagnosis not present

## 2021-10-01 DIAGNOSIS — R1032 Left lower quadrant pain: Secondary | ICD-10-CM | POA: Diagnosis not present

## 2021-10-17 DIAGNOSIS — N8110 Cystocele, unspecified: Secondary | ICD-10-CM | POA: Diagnosis not present

## 2021-10-17 DIAGNOSIS — M9902 Segmental and somatic dysfunction of thoracic region: Secondary | ICD-10-CM | POA: Diagnosis not present

## 2021-10-17 DIAGNOSIS — M542 Cervicalgia: Secondary | ICD-10-CM | POA: Diagnosis not present

## 2021-10-17 DIAGNOSIS — R1032 Left lower quadrant pain: Secondary | ICD-10-CM | POA: Diagnosis not present

## 2021-10-17 DIAGNOSIS — M9901 Segmental and somatic dysfunction of cervical region: Secondary | ICD-10-CM | POA: Diagnosis not present

## 2021-10-17 DIAGNOSIS — K7689 Other specified diseases of liver: Secondary | ICD-10-CM | POA: Diagnosis not present

## 2021-10-17 DIAGNOSIS — M4316 Spondylolisthesis, lumbar region: Secondary | ICD-10-CM | POA: Diagnosis not present

## 2021-10-17 DIAGNOSIS — K838 Other specified diseases of biliary tract: Secondary | ICD-10-CM | POA: Diagnosis not present

## 2021-10-17 DIAGNOSIS — M546 Pain in thoracic spine: Secondary | ICD-10-CM | POA: Diagnosis not present

## 2021-10-30 DIAGNOSIS — Z79899 Other long term (current) drug therapy: Secondary | ICD-10-CM | POA: Diagnosis not present

## 2021-10-30 DIAGNOSIS — G894 Chronic pain syndrome: Secondary | ICD-10-CM | POA: Diagnosis not present

## 2021-10-30 DIAGNOSIS — Z6832 Body mass index (BMI) 32.0-32.9, adult: Secondary | ICD-10-CM | POA: Diagnosis not present

## 2021-10-30 DIAGNOSIS — R030 Elevated blood-pressure reading, without diagnosis of hypertension: Secondary | ICD-10-CM | POA: Diagnosis not present

## 2021-11-14 DIAGNOSIS — M542 Cervicalgia: Secondary | ICD-10-CM | POA: Diagnosis not present

## 2021-11-14 DIAGNOSIS — M9902 Segmental and somatic dysfunction of thoracic region: Secondary | ICD-10-CM | POA: Diagnosis not present

## 2021-11-14 DIAGNOSIS — M9901 Segmental and somatic dysfunction of cervical region: Secondary | ICD-10-CM | POA: Diagnosis not present

## 2021-11-14 DIAGNOSIS — M546 Pain in thoracic spine: Secondary | ICD-10-CM | POA: Diagnosis not present

## 2021-11-29 DIAGNOSIS — Z79899 Other long term (current) drug therapy: Secondary | ICD-10-CM | POA: Diagnosis not present

## 2021-11-29 DIAGNOSIS — Z6832 Body mass index (BMI) 32.0-32.9, adult: Secondary | ICD-10-CM | POA: Diagnosis not present

## 2021-11-29 DIAGNOSIS — G894 Chronic pain syndrome: Secondary | ICD-10-CM | POA: Diagnosis not present

## 2021-11-29 DIAGNOSIS — M545 Low back pain, unspecified: Secondary | ICD-10-CM | POA: Diagnosis not present

## 2021-12-12 DIAGNOSIS — M9902 Segmental and somatic dysfunction of thoracic region: Secondary | ICD-10-CM | POA: Diagnosis not present

## 2021-12-12 DIAGNOSIS — M546 Pain in thoracic spine: Secondary | ICD-10-CM | POA: Diagnosis not present

## 2021-12-12 DIAGNOSIS — M542 Cervicalgia: Secondary | ICD-10-CM | POA: Diagnosis not present

## 2021-12-12 DIAGNOSIS — M9901 Segmental and somatic dysfunction of cervical region: Secondary | ICD-10-CM | POA: Diagnosis not present

## 2021-12-25 ENCOUNTER — Other Ambulatory Visit: Payer: Self-pay

## 2021-12-25 ENCOUNTER — Ambulatory Visit: Payer: Medicare Other | Admitting: Dermatology

## 2021-12-25 ENCOUNTER — Encounter: Payer: Self-pay | Admitting: Physician Assistant

## 2021-12-25 ENCOUNTER — Ambulatory Visit: Payer: Medicare Other | Admitting: Physician Assistant

## 2021-12-25 DIAGNOSIS — L570 Actinic keratosis: Secondary | ICD-10-CM | POA: Diagnosis not present

## 2021-12-25 DIAGNOSIS — Z86018 Personal history of other benign neoplasm: Secondary | ICD-10-CM | POA: Diagnosis not present

## 2021-12-25 DIAGNOSIS — Z1283 Encounter for screening for malignant neoplasm of skin: Secondary | ICD-10-CM

## 2021-12-25 MED ORDER — TOLAK 4 % EX CREA: TOPICAL_CREAM | CUTANEOUS | 0 refills | Status: AC

## 2021-12-25 NOTE — Patient Instructions (Addendum)
If you do not hear from [facility] by the end of the business day today give them a call tomorrow @ [phone].

## 2021-12-26 ENCOUNTER — Encounter: Payer: Self-pay | Admitting: Physician Assistant

## 2021-12-26 NOTE — Progress Notes (Signed)
° °  Follow-Up Visit   Subjective  Morgan Rowland is a 71 y.o. female who presents for the following: Annual Exam (No new concerns- No family h/o melanoma or non mole skin cancers. Personal history of atypical nevi. ).   The following portions of the chart were reviewed this encounter and updated as appropriate:  Tobacco   Allergies   Meds   Problems   Med Hx   Surg Hx   Fam Hx       Objective  Well appearing patient in no apparent distress; mood and affect are within normal limits.  A full examination was performed including scalp, head, eyes, ears, nose, lips, neck, chest, axillae, abdomen, back, buttocks, bilateral upper extremities, bilateral lower extremities, hands, feet, fingers, toes, fingernails, and toenails. All findings within normal limits unless otherwise noted below.  Head - Anterior (Face) Erythematous patches with gritty scale.   Assessment & Plan  Actinic keratosis Head - Anterior (Face)  Fluorouracil (TOLAK) 4 % CREA - Head - Anterior (Face) APPLY TO AFFECTED AREA NIGHTLY x 2 weeks.   No atypical nevi noted at the time of the visit. I, Dior Stepter, PA-C, have reviewed all documentation's for this visit.  The documentation on 12/26/21 for the exam, diagnosis, procedures and orders are all accurate and complete.

## 2021-12-27 DIAGNOSIS — M545 Low back pain, unspecified: Secondary | ICD-10-CM | POA: Diagnosis not present

## 2021-12-27 DIAGNOSIS — G894 Chronic pain syndrome: Secondary | ICD-10-CM | POA: Diagnosis not present

## 2021-12-27 DIAGNOSIS — G8929 Other chronic pain: Secondary | ICD-10-CM | POA: Diagnosis not present

## 2021-12-27 DIAGNOSIS — Z79899 Other long term (current) drug therapy: Secondary | ICD-10-CM | POA: Diagnosis not present

## 2021-12-27 DIAGNOSIS — M4716 Other spondylosis with myelopathy, lumbar region: Secondary | ICD-10-CM | POA: Diagnosis not present

## 2022-01-09 DIAGNOSIS — M542 Cervicalgia: Secondary | ICD-10-CM | POA: Diagnosis not present

## 2022-01-09 DIAGNOSIS — M546 Pain in thoracic spine: Secondary | ICD-10-CM | POA: Diagnosis not present

## 2022-01-09 DIAGNOSIS — M9901 Segmental and somatic dysfunction of cervical region: Secondary | ICD-10-CM | POA: Diagnosis not present

## 2022-01-09 DIAGNOSIS — M9902 Segmental and somatic dysfunction of thoracic region: Secondary | ICD-10-CM | POA: Diagnosis not present

## 2022-01-28 DIAGNOSIS — M545 Low back pain, unspecified: Secondary | ICD-10-CM | POA: Diagnosis not present

## 2022-01-28 DIAGNOSIS — Z79899 Other long term (current) drug therapy: Secondary | ICD-10-CM | POA: Diagnosis not present

## 2022-01-28 DIAGNOSIS — G894 Chronic pain syndrome: Secondary | ICD-10-CM | POA: Diagnosis not present

## 2022-01-28 DIAGNOSIS — R030 Elevated blood-pressure reading, without diagnosis of hypertension: Secondary | ICD-10-CM | POA: Diagnosis not present

## 2022-01-28 DIAGNOSIS — M4716 Other spondylosis with myelopathy, lumbar region: Secondary | ICD-10-CM | POA: Diagnosis not present

## 2022-01-30 DIAGNOSIS — Z6828 Body mass index (BMI) 28.0-28.9, adult: Secondary | ICD-10-CM | POA: Diagnosis not present

## 2022-01-30 DIAGNOSIS — Z79899 Other long term (current) drug therapy: Secondary | ICD-10-CM | POA: Diagnosis not present

## 2022-01-30 DIAGNOSIS — K219 Gastro-esophageal reflux disease without esophagitis: Secondary | ICD-10-CM | POA: Diagnosis not present

## 2022-01-30 DIAGNOSIS — Z90710 Acquired absence of both cervix and uterus: Secondary | ICD-10-CM | POA: Diagnosis not present

## 2022-01-30 DIAGNOSIS — I10 Essential (primary) hypertension: Secondary | ICD-10-CM | POA: Diagnosis not present

## 2022-01-30 DIAGNOSIS — N390 Urinary tract infection, site not specified: Secondary | ICD-10-CM | POA: Diagnosis not present

## 2022-01-30 DIAGNOSIS — N3000 Acute cystitis without hematuria: Secondary | ICD-10-CM | POA: Diagnosis not present

## 2022-01-30 DIAGNOSIS — Z882 Allergy status to sulfonamides status: Secondary | ICD-10-CM | POA: Diagnosis not present

## 2022-02-01 DIAGNOSIS — Z8249 Family history of ischemic heart disease and other diseases of the circulatory system: Secondary | ICD-10-CM | POA: Diagnosis not present

## 2022-02-01 DIAGNOSIS — R109 Unspecified abdominal pain: Secondary | ICD-10-CM | POA: Diagnosis not present

## 2022-02-01 DIAGNOSIS — R197 Diarrhea, unspecified: Secondary | ICD-10-CM | POA: Diagnosis not present

## 2022-02-01 DIAGNOSIS — I10 Essential (primary) hypertension: Secondary | ICD-10-CM | POA: Diagnosis not present

## 2022-02-01 DIAGNOSIS — F419 Anxiety disorder, unspecified: Secondary | ICD-10-CM | POA: Diagnosis not present

## 2022-02-01 DIAGNOSIS — E860 Dehydration: Secondary | ICD-10-CM | POA: Diagnosis not present

## 2022-02-01 DIAGNOSIS — R112 Nausea with vomiting, unspecified: Secondary | ICD-10-CM | POA: Diagnosis not present

## 2022-02-01 DIAGNOSIS — Z833 Family history of diabetes mellitus: Secondary | ICD-10-CM | POA: Diagnosis not present

## 2022-02-01 DIAGNOSIS — R079 Chest pain, unspecified: Secondary | ICD-10-CM | POA: Diagnosis not present

## 2022-02-03 DIAGNOSIS — Z882 Allergy status to sulfonamides status: Secondary | ICD-10-CM | POA: Diagnosis not present

## 2022-02-03 DIAGNOSIS — R1032 Left lower quadrant pain: Secondary | ICD-10-CM | POA: Diagnosis not present

## 2022-02-03 DIAGNOSIS — R109 Unspecified abdominal pain: Secondary | ICD-10-CM | POA: Diagnosis not present

## 2022-02-03 DIAGNOSIS — E871 Hypo-osmolality and hyponatremia: Secondary | ICD-10-CM | POA: Diagnosis not present

## 2022-02-03 DIAGNOSIS — R509 Fever, unspecified: Secondary | ICD-10-CM | POA: Diagnosis not present

## 2022-02-03 DIAGNOSIS — R10814 Left lower quadrant abdominal tenderness: Secondary | ICD-10-CM | POA: Diagnosis not present

## 2022-02-03 DIAGNOSIS — I700 Atherosclerosis of aorta: Secondary | ICD-10-CM | POA: Diagnosis not present

## 2022-02-07 DIAGNOSIS — Z6828 Body mass index (BMI) 28.0-28.9, adult: Secondary | ICD-10-CM | POA: Diagnosis not present

## 2022-02-07 DIAGNOSIS — F419 Anxiety disorder, unspecified: Secondary | ICD-10-CM | POA: Diagnosis not present

## 2022-02-07 DIAGNOSIS — G4700 Insomnia, unspecified: Secondary | ICD-10-CM | POA: Diagnosis not present

## 2022-02-09 DIAGNOSIS — F419 Anxiety disorder, unspecified: Secondary | ICD-10-CM | POA: Diagnosis not present

## 2022-02-09 DIAGNOSIS — R002 Palpitations: Secondary | ICD-10-CM | POA: Diagnosis not present

## 2022-02-09 DIAGNOSIS — I10 Essential (primary) hypertension: Secondary | ICD-10-CM | POA: Diagnosis not present

## 2022-02-09 DIAGNOSIS — R519 Headache, unspecified: Secondary | ICD-10-CM | POA: Diagnosis not present

## 2022-02-09 DIAGNOSIS — G479 Sleep disorder, unspecified: Secondary | ICD-10-CM | POA: Diagnosis not present

## 2022-02-09 DIAGNOSIS — G4700 Insomnia, unspecified: Secondary | ICD-10-CM | POA: Diagnosis not present

## 2022-02-13 DIAGNOSIS — Z6828 Body mass index (BMI) 28.0-28.9, adult: Secondary | ICD-10-CM | POA: Diagnosis not present

## 2022-02-13 DIAGNOSIS — R079 Chest pain, unspecified: Secondary | ICD-10-CM | POA: Diagnosis not present

## 2022-02-13 DIAGNOSIS — F419 Anxiety disorder, unspecified: Secondary | ICD-10-CM | POA: Diagnosis not present

## 2022-02-13 DIAGNOSIS — G4700 Insomnia, unspecified: Secondary | ICD-10-CM | POA: Diagnosis not present

## 2022-02-14 ENCOUNTER — Encounter (HOSPITAL_COMMUNITY): Payer: Self-pay

## 2022-02-14 ENCOUNTER — Emergency Department (HOSPITAL_COMMUNITY)
Admission: EM | Admit: 2022-02-14 | Discharge: 2022-02-14 | Disposition: A | Discharge status: Home / Self Care | Payer: Medicare Other | Attending: Emergency Medicine | Admitting: Emergency Medicine

## 2022-02-14 ENCOUNTER — Other Ambulatory Visit: Payer: Self-pay

## 2022-02-14 DIAGNOSIS — Z7982 Long term (current) use of aspirin: Secondary | ICD-10-CM | POA: Insufficient documentation

## 2022-02-14 DIAGNOSIS — R519 Headache, unspecified: Secondary | ICD-10-CM | POA: Diagnosis not present

## 2022-02-14 DIAGNOSIS — G4700 Insomnia, unspecified: Secondary | ICD-10-CM | POA: Diagnosis not present

## 2022-02-14 MED ORDER — DIPHENHYDRAMINE HCL 50 MG/ML IJ SOLN
25.0000 mg | Freq: Once | INTRAMUSCULAR | Status: AC
  Administered 2022-02-14: 25 mg via INTRAVENOUS
  Filled 2022-02-14: qty 1

## 2022-02-14 MED ORDER — KETOROLAC TROMETHAMINE 60 MG/2ML IM SOLN
30.0000 mg | Freq: Once | INTRAMUSCULAR | Status: AC
  Administered 2022-02-14: 30 mg via INTRAMUSCULAR
  Filled 2022-02-14: qty 2

## 2022-02-14 MED ORDER — METOCLOPRAMIDE HCL 5 MG/ML IJ SOLN
10.0000 mg | Freq: Once | INTRAMUSCULAR | Status: AC
  Administered 2022-02-14: 10 mg via INTRAVENOUS
  Filled 2022-02-14: qty 2

## 2022-02-14 NOTE — ED Provider Notes (Signed)
?[place] ?Provider Note ? ? ?CSN: [id number] ?Arrival date & time: 02/14/22  6546 ? ?  ? ?History ? ?Chief Complaint  ?Patient presents with  ? Insomnia  ? ? ?Morgan Rowland is a 71 y.o. female. ? ? ?Insomnia ?Associated symptoms include headaches. Pertinent negatives include no chest pain, no abdominal pain and no shortness of breath.  ? ?  ? ?Morgan Rowland is a 71 y.o. female with past medical history of diverticulitis, headaches who presents to the Emergency Department questing evaluation for insomnia.  She states that she has not slept well in several weeks.  She states that she was treated about 1 month ago for acute diverticulitis and since that time has been unable to sleep.  She has seen her PCP and recently evaluated at the [facility] ER for her persistent insomnia.  She states this has occurred in the past but not recently.  Her PCP started her on trazodone and she currently takes lorazepam, but states these medications are not helping.  She has also tried over-the-counter melatonin without relief.  She complains of a mild frontal headache which she attributes to lack of sleep.  Headache was gradual in onset.  She denies any neck pain or stiffness.  No visual changes.  She denies any thunderclap headaches, persistent abdominal pain, fever or chills, vomiting or chest pain. ? ?Home Medications ?Prior to Admission medications   ?Medication Sig Start Date End Date Taking? Authorizing Provider  ?aspirin 81 MG tablet Take 81 mg by mouth daily.   Yes [provider]  ?atorvastatin (LIPITOR) 10 MG tablet Take 20 mg by mouth daily.   Yes [provider]  ?dicyclomine (BENTYL) 10 MG capsule TAKE 1 TO 2 CAPSULES (10-20 MG TOTAL) BY MOUTH EVERY 8 HOURS AS NEEDED FOR SPASMS 11/25/19  Yes Armbruster, Carlota Raspberry, MD  ?HYDROcodone-acetaminophen (NORCO) 10-325 MG per tablet Take 1 tablet by mouth every 6 (six) hours as needed for moderate pain.   Yes [provider]  ?lisinopril  (PRINIVIL,ZESTRIL) 20 MG tablet Take 20 mg by mouth daily.   Yes [provider]  ?LORazepam (ATIVAN) 2 MG tablet Take 2 mg by mouth at bedtime.   Yes [provider]  ?Multiple Vitamins-Minerals (MULTIVITAMIN WITH MINERALS) tablet Take 1 tablet by mouth daily.   Yes [provider]  ?NARCAN 4 MG/0.1ML LIQD nasal spray kit USE 1 SPRAY AS NEEDED FOR ACCIDENTAL OVERDOSE 05/17/19  Yes [provider]  ?omeprazole (PRILOSEC) 20 MG capsule Take 1 capsule (20 mg total) by mouth 2 (two) times daily before a meal. Please schedule an Office Visit for further refills. Thank you 08/01/20  Yes Armbruster, Carlota Raspberry, MD  ?raloxifene (EVISTA) 60 MG tablet Take 60 mg by mouth daily.   Yes [provider]  ?traZODone (DESYREL) 50 MG tablet Take 50 mg by mouth at bedtime as needed for sleep. 02/07/22  Yes [provider]  ?valACYclovir (VALTREX) 500 MG tablet Take 1 tablet by mouth once daily ?Patient taking differently: Take 500 mg by mouth daily as needed. 05/01/21  Yes Lavonna Monarch, MD  ?Fluorouracil (TOLAK) 4 % CREA APPLY TO AFFECTED AREA NIGHTLY x 2 weeks. ?Patient not taking: Reported on 02/14/2022 12/25/21   Robyne Askew R, PA-C  ?nortriptyline (PAMELOR) 10 MG/5ML solution Take 7.5 mLs (15 mg total) by mouth at bedtime. ?Patient not taking: Reported on 02/14/2022 09/21/19   Yetta Flock, MD  ?Peppermint Oil (IBGARD) 90 MG CPCR Take 2 capsules by  mouth as needed. Take as directed as needed ?Patient not taking: Reported on 02/14/2022 09/21/19   Yetta Flock, MD  ?   ? ?Allergies    ?Ciprofloxacin and Sulfa antibiotics   ? ?Review of Systems   ?Review of Systems  ?Constitutional:  Negative for activity change, appetite change, chills and fever.  ?Eyes:  Negative for visual disturbance.  ?Respiratory:  Negative for cough and shortness of breath.   ?Cardiovascular:  Negative for chest pain.  ?Gastrointestinal:  Negative for abdominal pain, nausea and vomiting.   ?Musculoskeletal:  Negative for arthralgias and back pain.  ?Neurological:  Positive for headaches. Negative for dizziness, syncope and weakness.  ?Psychiatric/Behavioral:  The patient is nervous/anxious and has insomnia.   ?All other systems reviewed and are negative. ? ?Physical Exam ?Updated Vital Signs ?BP (!) 175/98 (BP Location: Right Arm)   Pulse 97   Temp 98.9 ?F (37.2 ?C) (Oral)   Resp 16   Ht 5' 1"  (1.549 m)   Wt 77.1 kg   SpO2 100%   BMI 32.12 kg/m?  ?Physical Exam ?Vitals and nursing note reviewed.  ?Constitutional:   ?   General: She is not in acute distress. ?   Appearance: Normal appearance.  ?   Comments: Patient is slightly anxious appearing.  ?HENT:  ?   Head: Atraumatic.  ?   Mouth/Throat:  ?   Mouth: Mucous membranes are moist.  ?Eyes:  ?   Extraocular Movements: Extraocular movements intact.  ?   Conjunctiva/sclera: Conjunctivae normal.  ?   Pupils: Pupils are equal, round, and reactive to light.  ?Cardiovascular:  ?   Rate and Rhythm: Normal rate and regular rhythm.  ?   Pulses: Normal pulses.  ?Pulmonary:  ?   Effort: Pulmonary effort is normal. No respiratory distress.  ?   Breath sounds: Normal breath sounds.  ?Abdominal:  ?   General: There is no distension.  ?   Palpations: Abdomen is soft.  ?   Tenderness: There is no abdominal tenderness.  ?Musculoskeletal:  ?   Right lower leg: No edema.  ?   Left lower leg: No edema.  ?Skin: ?   General: Skin is warm.  ?   Capillary Refill: Capillary refill takes less than 2 seconds.  ?Neurological:  ?   General: No focal deficit present.  ?   Mental Status: She is alert.  ?   Sensory: No sensory deficit.  ?   Motor: No weakness.  ? ? ?ED Results / Procedures / Treatments   ?Labs ?(all labs ordered are listed, but only abnormal results are displayed) ?Labs Reviewed - No data to display ? ?EKG ?None ? ?Radiology ?No results found. ? ?Procedures ?Procedures  ? ? ?Medications Ordered in ED ?Medications  ?ketorolac (TORADOL) injection 30 mg (30 mg  Intramuscular Given 02/14/22 1154)  ?diphenhydrAMINE (BENADRYL) injection 25 mg (25 mg Intravenous Given 02/14/22 1153)  ?metoCLOPramide (REGLAN) injection 10 mg (10 mg Intravenous Given 02/14/22 1154)  ? ? ?ED Course/ Medical Decision Making/ A&P ?  ?                        ?Medical Decision Making ?Risk ?Prescription drug management. ? ? ?Patient here requesting evaluation for persistent insomnia.  States her symptoms began after taking antibiotics for diverticulitis.  She no longer has abdominal pain or symptoms related to her diverticulitis.  She states she has been unable to sleep well for nearly 1 month.  She  has been evaluated at another ER facility and her PCP for same.  No improvement after taking trazodone and lorazepam. ? ?On exam here patient is anxious appearing, her vital signs are reassuring.  No focal neurodeficits on exam, no nuchal rigidity.  She does complain of a slight frontal headache.  We will try migraine cocktail and reassess. ? ?On recheck, patient states that her headache has resolved and she is now feeling "sleepy."  States that she feels comfortable with discharge home.  Recommended close outpatient follow-up with PCP.  Return precautions were discussed. ? ? ? ? ? ? ? ? ? ?Final Clinical Impression(s) / ED Diagnoses ?Final diagnoses:  ?Insomnia, unspecified type  ?Headache disorder  ? ? ?Rx / DC Orders ?ED Discharge Orders   ? ? None  ? ?  ? ? ?  ?Kem Parkinson, PA-C ?02/14/22 1303 ? ?  ?Milton Ferguson, MD ?02/15/22 1123 ? ?

## 2022-02-14 NOTE — ED Triage Notes (Signed)
Patient with complaints of insomnia over the past month. Has seen PCP and been to the ER in [place].  ?

## 2022-02-14 NOTE — Discharge Instructions (Signed)
Follow-up with your primary care provider for recheck.  You may try taking over-the-counter Benadryl, one 25 mg capsule at bedtime ?

## 2022-02-25 DIAGNOSIS — Z79899 Other long term (current) drug therapy: Secondary | ICD-10-CM | POA: Diagnosis not present

## 2022-02-25 DIAGNOSIS — M545 Low back pain, unspecified: Secondary | ICD-10-CM | POA: Diagnosis not present

## 2022-02-25 DIAGNOSIS — R030 Elevated blood-pressure reading, without diagnosis of hypertension: Secondary | ICD-10-CM | POA: Diagnosis not present

## 2022-02-25 DIAGNOSIS — G894 Chronic pain syndrome: Secondary | ICD-10-CM | POA: Diagnosis not present

## 2022-02-25 DIAGNOSIS — M4716 Other spondylosis with myelopathy, lumbar region: Secondary | ICD-10-CM | POA: Diagnosis not present

## 2022-03-08 ENCOUNTER — Encounter (HOSPITAL_COMMUNITY): Payer: Self-pay | Admitting: *Deleted

## 2022-03-08 ENCOUNTER — Emergency Department (HOSPITAL_COMMUNITY)
Admission: EM | Admit: 2022-03-08 | Discharge: 2022-03-08 | Disposition: A | Discharge status: Home / Self Care | Payer: Medicare Other | Attending: Emergency Medicine | Admitting: Emergency Medicine

## 2022-03-08 ENCOUNTER — Emergency Department (HOSPITAL_COMMUNITY): Payer: Medicare Other

## 2022-03-08 DIAGNOSIS — Z981 Arthrodesis status: Secondary | ICD-10-CM | POA: Diagnosis not present

## 2022-03-08 DIAGNOSIS — X509XXA Other and unspecified overexertion or strenuous movements or postures, initial encounter: Secondary | ICD-10-CM | POA: Insufficient documentation

## 2022-03-08 DIAGNOSIS — S3992XA Unspecified injury of lower back, initial encounter: Secondary | ICD-10-CM | POA: Diagnosis present

## 2022-03-08 DIAGNOSIS — S39012A Strain of muscle, fascia and tendon of lower back, initial encounter: Secondary | ICD-10-CM | POA: Diagnosis not present

## 2022-03-08 DIAGNOSIS — Z7982 Long term (current) use of aspirin: Secondary | ICD-10-CM | POA: Insufficient documentation

## 2022-03-08 DIAGNOSIS — M545 Low back pain, unspecified: Secondary | ICD-10-CM | POA: Diagnosis not present

## 2022-03-08 DIAGNOSIS — M25551 Pain in right hip: Secondary | ICD-10-CM | POA: Diagnosis not present

## 2022-03-08 MED ORDER — HYDROCODONE-ACETAMINOPHEN 5-325 MG PO TABS
1.0000 | ORAL_TABLET | Freq: Once | ORAL | Status: AC
  Administered 2022-03-08: 1 via ORAL
  Filled 2022-03-08: qty 1

## 2022-03-08 NOTE — ED Provider Notes (Signed)
?[facility] ?Provider Note ? ? ?CSN: [id number] ?Arrival date & time: 03/08/22  1043 ? ?  ? ?History ? ?Chief complaint: Back pain follow-up ? ?Morgan Rowland is a 71 y.o. female. ? ?HPI ? ?  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  Patient states she slipped on the wet deck this morning and fell actually more onto her left side but she felt a pop in her lower back and on the right side.  Patient is having pain and discomfort in that area.  She was able to walk but it was with significant pain.  She denies any numbness.  No abdominal pain.  No chest pain or shortness of breath. ?Home Medications ?Prior to Admission medications   ?Medication Sig Start Date End Date Taking? Authorizing Provider  ?aspirin 81 MG tablet Take 81 mg by mouth daily.    [provider]  ?atorvastatin (LIPITOR) 10 MG tablet Take 20 mg by mouth daily.    [provider]  ?dicyclomine (BENTYL) 10 MG capsule TAKE 1 TO 2 CAPSULES (10-20 MG TOTAL) BY MOUTH EVERY 8 HOURS AS NEEDED FOR SPASMS 11/25/19   Armbruster, Carlota Raspberry, MD  ?Fluorouracil (TOLAK) 4 % CREA APPLY TO AFFECTED AREA NIGHTLY x 2 weeks. ?Patient not taking: Reported on 02/14/2022 12/25/21   Warren Danes, PA-C  ?HYDROcodone-acetaminophen (NORCO) 10-325 MG per tablet Take 1 tablet by mouth every 6 (six) hours as needed for moderate pain.    [provider]  ?lisinopril (PRINIVIL,ZESTRIL) 20 MG tablet Take 20 mg by mouth daily.    [provider]  ?LORazepam (ATIVAN) 2 MG tablet Take 2 mg by mouth at bedtime.    [provider]  ?Multiple Vitamins-Minerals (MULTIVITAMIN WITH MINERALS) tablet Take 1 tablet by mouth daily.    [provider]  ?NARCAN 4 MG/0.1ML LIQD nasal spray kit USE 1 SPRAY AS NEEDED FOR ACCIDENTAL OVERDOSE 05/17/19   [provider]  ?nortriptyline (PAMELOR) 10 MG/5ML  solution Take 7.5 mLs (15 mg total) by mouth at bedtime. ?Patient not taking: Reported on 02/14/2022 09/21/19   Yetta Flock, MD  ?omeprazole (PRILOSEC) 20 MG capsule Take 1 capsule (20 mg total) by mouth 2 (two) times daily before a meal. Please schedule an Office Visit for further refills. Thank you 08/01/20   Yetta Flock, MD  ?Peppermint Oil (IBGARD) 90 MG CPCR Take 2 capsules by mouth as needed. Take as directed as needed ?Patient not taking: Reported on 02/14/2022 09/21/19   Yetta Flock, MD  ?raloxifene (EVISTA) 60 MG tablet Take 60 mg by mouth daily.    [provider]  ?traZODone (DESYREL) 50 MG tablet Take 50 mg by mouth at bedtime as needed for sleep. 02/07/22   [provider]  ?valACYclovir (VALTREX) 500 MG tablet Take 1 tablet by mouth once daily ?Patient taking differently: Take 500 mg by mouth daily as needed. 05/01/21   Lavonna Monarch, MD  ?   ? ?Allergies    ?Ciprofloxacin and Sulfa antibiotics   ? ?Review of Systems   ?Review of Systems  ?Constitutional:  Negative for fever.  ? ?Physical Exam ?Updated Vital Signs ?BP (!) 146/96 (BP Location: Right Arm)   Pulse 77   Temp 98.4 ?F (36.9 ?C) (Oral)   Resp 15   Ht 1.549 m (5' 1" )   Wt 78 kg   SpO2 99%   BMI 32.50  kg/m?  ?Physical Exam ?Vitals and nursing note reviewed.  ?Constitutional:   ?   General: She is not in acute distress. ?   Appearance: She is well-developed.  ?HENT:  ?   Head: Normocephalic and atraumatic.  ?   Right Ear: External ear normal.  ?   Left Ear: External ear normal.  ?Eyes:  ?   General: No scleral icterus.    ?   Right eye: No discharge.     ?   Left eye: No discharge.  ?   Conjunctiva/sclera: Conjunctivae normal.  ?Neck:  ?   Trachea: No tracheal deviation.  ?Cardiovascular:  ?   Rate and Rhythm: Normal rate.  ?Pulmonary:  ?   Effort: Pulmonary effort is normal. No respiratory distress.  ?   Breath sounds: No stridor.  ?Abdominal:  ?   General: There is no distension.   ?Musculoskeletal:     ?   General: No swelling or deformity.  ?   Cervical back: Normal and neck supple.  ?   Thoracic back: Normal.  ?   Lumbar back: Tenderness present.  ?   Right hip: Normal.  ?   Left hip: Normal.  ?   Comments: Tenderness palpation lumbar spine as well as right buttock region  ?Skin: ?   General: Skin is warm and dry.  ?   Findings: No rash.  ?Neurological:  ?   Mental Status: She is alert.  ?   Cranial Nerves: Cranial nerve deficit: no gross deficits.  ?   Motor: Motor function is intact. No weakness.  ?   Comments: Normal strength and sensation bilateral lower extremities  ? ? ?ED Results / Procedures / Treatments   ?Labs ?(all labs ordered are listed, but only abnormal results are displayed) ?Labs Reviewed - No data to display ? ?EKG ?None ? ?Radiology ?DG Lumbar Spine Complete ? ?Result Date: 03/08/2022 ?CLINICAL DATA:  Fall this morning with low back pain radiating to right side. EXAM: LUMBAR SPINE - COMPLETE 4+ VIEW COMPARISON:  01/20/2018 FINDINGS: Vertebral body heights are normal. There is mild spondylosis throughout the lumbar spine. Posterior fusion hardware intact and unchanged at the L4-5 level. Interbody fusion at the L4-5 level. Stable decreased height of L5. Possible subtle anterolisthesis of L4 on L5 unchanged. No acute compression fracture. IMPRESSION: 1. No acute findings. 2. Mild spondylosis of the lumbar spine with posterior fusion hardware intact and unchanged at the L4-5 level. Electronically Signed   By: Marin Olp M.D.   On: 03/08/2022 12:26  ? ?DG Hip Unilat W or Wo Pelvis 2-3 Views Right ? ?Result Date: 03/08/2022 ?CLINICAL DATA:  Fall this morning with low back pain radiating to the right hip EXAM: DG HIP (WITH OR WITHOUT PELVIS) 2-3V RIGHT COMPARISON:  None. FINDINGS: No pelvic fracture or diastasis. No right hip fracture or dislocation. No suspicious focal osseous lesions. No significant hip arthropathy. Bilateral posterior spinal fusion hardware in the lower  lumbar spine. IMPRESSION: No fracture. Electronically Signed   By: Ilona Sorrel M.D.   On: 03/08/2022 12:24   ? ?Procedures ?Procedures  ? ? ?Medications Ordered in ED ?Medications  ?HYDROcodone-acetaminophen (NORCO/VICODIN) 5-325 MG per tablet 1 tablet (1 tablet Oral Given 03/08/22 1232)  ? ? ?ED Course/ Medical Decision Making/ A&P ?Clinical Course as of 03/08/22 1237  ?Fri Mar 08, 2022  ?1232 Hip and lumbar spine x-rays without acute fracture [JK]  ?  ?Clinical Course User Index ?[JK] Dorie Rank, MD  ? ?                        ?  Medical Decision Making ?Amount and/or Complexity of Data Reviewed ?Radiology: ordered. ? ?Risk ?Prescription drug management. ? ? ?Patient presented to ED for evaluation for fall.  History of some chronic back issues.  She does have hydrocodone and baclofen.  X-rays do not show any signs of acute fracture or hardware complication. ? ?Evaluation and diagnostic testing in the emergency department does not suggest an emergent condition requiring admission or immediate intervention beyond what has been performed at this time.  The patient is safe for discharge and has been instructed to return immediately for worsening symptoms, change in symptoms or any other concerns. ? ? ? ? ? ? ? ? ?Final Clinical Impression(s) / ED Diagnoses ?Final diagnoses:  ?Strain of lumbar region, initial encounter  ? ? ?Rx / DC Orders ?ED Discharge Orders   ? ? None  ? ?  ? ? ?  ?Dorie Rank, MD ?03/08/22 1237 ? ?

## 2022-03-08 NOTE — Discharge Instructions (Signed)
Continue your medications at home.  Follow-up with your primary care doctor or consider seeing a spine doctor if the symptoms do not improve in the next week ?

## 2022-03-21 DIAGNOSIS — G8929 Other chronic pain: Secondary | ICD-10-CM | POA: Diagnosis not present

## 2022-03-21 DIAGNOSIS — M545 Low back pain, unspecified: Secondary | ICD-10-CM | POA: Diagnosis not present

## 2022-03-21 DIAGNOSIS — Z79899 Other long term (current) drug therapy: Secondary | ICD-10-CM | POA: Diagnosis not present

## 2022-03-21 DIAGNOSIS — M4716 Other spondylosis with myelopathy, lumbar region: Secondary | ICD-10-CM | POA: Diagnosis not present

## 2022-03-21 DIAGNOSIS — R030 Elevated blood-pressure reading, without diagnosis of hypertension: Secondary | ICD-10-CM | POA: Diagnosis not present

## 2022-03-21 DIAGNOSIS — G894 Chronic pain syndrome: Secondary | ICD-10-CM | POA: Diagnosis not present

## 2022-03-22 DIAGNOSIS — M62830 Muscle spasm of back: Secondary | ICD-10-CM | POA: Diagnosis not present

## 2022-03-22 DIAGNOSIS — M9903 Segmental and somatic dysfunction of lumbar region: Secondary | ICD-10-CM | POA: Diagnosis not present

## 2022-03-22 DIAGNOSIS — M546 Pain in thoracic spine: Secondary | ICD-10-CM | POA: Diagnosis not present

## 2022-03-22 DIAGNOSIS — M9902 Segmental and somatic dysfunction of thoracic region: Secondary | ICD-10-CM | POA: Diagnosis not present

## 2022-03-26 DIAGNOSIS — M2569 Stiffness of other specified joint, not elsewhere classified: Secondary | ICD-10-CM | POA: Diagnosis not present

## 2022-03-26 DIAGNOSIS — M545 Low back pain, unspecified: Secondary | ICD-10-CM | POA: Diagnosis not present

## 2022-03-26 DIAGNOSIS — R293 Abnormal posture: Secondary | ICD-10-CM | POA: Diagnosis not present

## 2022-03-26 DIAGNOSIS — Z4789 Encounter for other orthopedic aftercare: Secondary | ICD-10-CM | POA: Diagnosis not present

## 2022-03-28 DIAGNOSIS — M545 Low back pain, unspecified: Secondary | ICD-10-CM | POA: Diagnosis not present

## 2022-03-28 DIAGNOSIS — Z4789 Encounter for other orthopedic aftercare: Secondary | ICD-10-CM | POA: Diagnosis not present

## 2022-03-28 DIAGNOSIS — M2569 Stiffness of other specified joint, not elsewhere classified: Secondary | ICD-10-CM | POA: Diagnosis not present

## 2022-03-28 DIAGNOSIS — R293 Abnormal posture: Secondary | ICD-10-CM | POA: Diagnosis not present

## 2022-04-02 DIAGNOSIS — R293 Abnormal posture: Secondary | ICD-10-CM | POA: Diagnosis not present

## 2022-04-02 DIAGNOSIS — M545 Low back pain, unspecified: Secondary | ICD-10-CM | POA: Diagnosis not present

## 2022-04-02 DIAGNOSIS — M2569 Stiffness of other specified joint, not elsewhere classified: Secondary | ICD-10-CM | POA: Diagnosis not present

## 2022-04-02 DIAGNOSIS — Z4789 Encounter for other orthopedic aftercare: Secondary | ICD-10-CM | POA: Diagnosis not present

## 2022-04-04 DIAGNOSIS — M545 Low back pain, unspecified: Secondary | ICD-10-CM | POA: Diagnosis not present

## 2022-04-04 DIAGNOSIS — M2569 Stiffness of other specified joint, not elsewhere classified: Secondary | ICD-10-CM | POA: Diagnosis not present

## 2022-04-04 DIAGNOSIS — Z4789 Encounter for other orthopedic aftercare: Secondary | ICD-10-CM | POA: Diagnosis not present

## 2022-04-04 DIAGNOSIS — R293 Abnormal posture: Secondary | ICD-10-CM | POA: Diagnosis not present

## 2022-04-10 DIAGNOSIS — M9902 Segmental and somatic dysfunction of thoracic region: Secondary | ICD-10-CM | POA: Diagnosis not present

## 2022-04-10 DIAGNOSIS — M546 Pain in thoracic spine: Secondary | ICD-10-CM | POA: Diagnosis not present

## 2022-04-10 DIAGNOSIS — M62830 Muscle spasm of back: Secondary | ICD-10-CM | POA: Diagnosis not present

## 2022-04-10 DIAGNOSIS — M9903 Segmental and somatic dysfunction of lumbar region: Secondary | ICD-10-CM | POA: Diagnosis not present

## 2022-05-03 DIAGNOSIS — G894 Chronic pain syndrome: Secondary | ICD-10-CM | POA: Diagnosis not present

## 2022-05-03 DIAGNOSIS — M4716 Other spondylosis with myelopathy, lumbar region: Secondary | ICD-10-CM | POA: Diagnosis not present

## 2022-05-03 DIAGNOSIS — M545 Low back pain, unspecified: Secondary | ICD-10-CM | POA: Diagnosis not present

## 2022-05-03 DIAGNOSIS — Z79899 Other long term (current) drug therapy: Secondary | ICD-10-CM | POA: Diagnosis not present

## 2022-05-03 DIAGNOSIS — G8929 Other chronic pain: Secondary | ICD-10-CM | POA: Diagnosis not present

## 2022-05-08 DIAGNOSIS — M9902 Segmental and somatic dysfunction of thoracic region: Secondary | ICD-10-CM | POA: Diagnosis not present

## 2022-05-08 DIAGNOSIS — M9903 Segmental and somatic dysfunction of lumbar region: Secondary | ICD-10-CM | POA: Diagnosis not present

## 2022-05-08 DIAGNOSIS — M546 Pain in thoracic spine: Secondary | ICD-10-CM | POA: Diagnosis not present

## 2022-05-08 DIAGNOSIS — M62830 Muscle spasm of back: Secondary | ICD-10-CM | POA: Diagnosis not present

## 2022-05-27 ENCOUNTER — Other Ambulatory Visit: Payer: Self-pay | Admitting: Nurse Practitioner

## 2022-05-27 DIAGNOSIS — M4716 Other spondylosis with myelopathy, lumbar region: Secondary | ICD-10-CM

## 2022-06-05 DIAGNOSIS — M545 Low back pain, unspecified: Secondary | ICD-10-CM | POA: Diagnosis not present

## 2022-06-05 DIAGNOSIS — R030 Elevated blood-pressure reading, without diagnosis of hypertension: Secondary | ICD-10-CM | POA: Diagnosis not present

## 2022-06-05 DIAGNOSIS — M4716 Other spondylosis with myelopathy, lumbar region: Secondary | ICD-10-CM | POA: Diagnosis not present

## 2022-06-05 DIAGNOSIS — G894 Chronic pain syndrome: Secondary | ICD-10-CM | POA: Diagnosis not present

## 2022-06-05 DIAGNOSIS — Z79899 Other long term (current) drug therapy: Secondary | ICD-10-CM | POA: Diagnosis not present

## 2022-06-11 ENCOUNTER — Ambulatory Visit
Admission: RE | Admit: 2022-06-11 | Discharge: 2022-06-11 | Disposition: A | Discharge status: Home / Self Care | Payer: Medicare Other | Source: Ambulatory Visit | Attending: Nurse Practitioner | Admitting: Nurse Practitioner

## 2022-06-11 DIAGNOSIS — M4807 Spinal stenosis, lumbosacral region: Secondary | ICD-10-CM | POA: Diagnosis not present

## 2022-06-11 DIAGNOSIS — M4716 Other spondylosis with myelopathy, lumbar region: Secondary | ICD-10-CM

## 2022-06-11 DIAGNOSIS — M5127 Other intervertebral disc displacement, lumbosacral region: Secondary | ICD-10-CM | POA: Diagnosis not present

## 2022-06-11 DIAGNOSIS — M5126 Other intervertebral disc displacement, lumbar region: Secondary | ICD-10-CM | POA: Diagnosis not present

## 2022-06-11 DIAGNOSIS — M4316 Spondylolisthesis, lumbar region: Secondary | ICD-10-CM | POA: Diagnosis not present

## 2022-06-11 MED ORDER — GADOBENATE DIMEGLUMINE 529 MG/ML IV SOLN
15.0000 mL | Freq: Once | INTRAVENOUS | Status: AC | PRN
  Administered 2022-06-11: 15 mL via INTRAVENOUS

## 2022-06-28 DIAGNOSIS — Z6832 Body mass index (BMI) 32.0-32.9, adult: Secondary | ICD-10-CM | POA: Diagnosis not present

## 2022-06-28 DIAGNOSIS — S22009A Unspecified fracture of unspecified thoracic vertebra, initial encounter for closed fracture: Secondary | ICD-10-CM | POA: Diagnosis not present

## 2022-07-05 DIAGNOSIS — M4716 Other spondylosis with myelopathy, lumbar region: Secondary | ICD-10-CM | POA: Diagnosis not present

## 2022-07-05 DIAGNOSIS — G894 Chronic pain syndrome: Secondary | ICD-10-CM | POA: Diagnosis not present

## 2022-07-05 DIAGNOSIS — Z79899 Other long term (current) drug therapy: Secondary | ICD-10-CM | POA: Diagnosis not present

## 2022-07-05 DIAGNOSIS — M545 Low back pain, unspecified: Secondary | ICD-10-CM | POA: Diagnosis not present

## 2022-07-05 DIAGNOSIS — R030 Elevated blood-pressure reading, without diagnosis of hypertension: Secondary | ICD-10-CM | POA: Diagnosis not present

## 2022-07-13 IMAGING — DX DG HIP (WITH OR WITHOUT PELVIS) 2-3V*R*
3 series · 3 of 3 positions shown · non-contrast
Comparison: None.

CLINICAL DATA: Fall this morning with low back pain radiating to
the right hip

EXAM:
DG HIP (WITH OR WITHOUT PELVIS) 2-3V RIGHT

[pelvis ap]
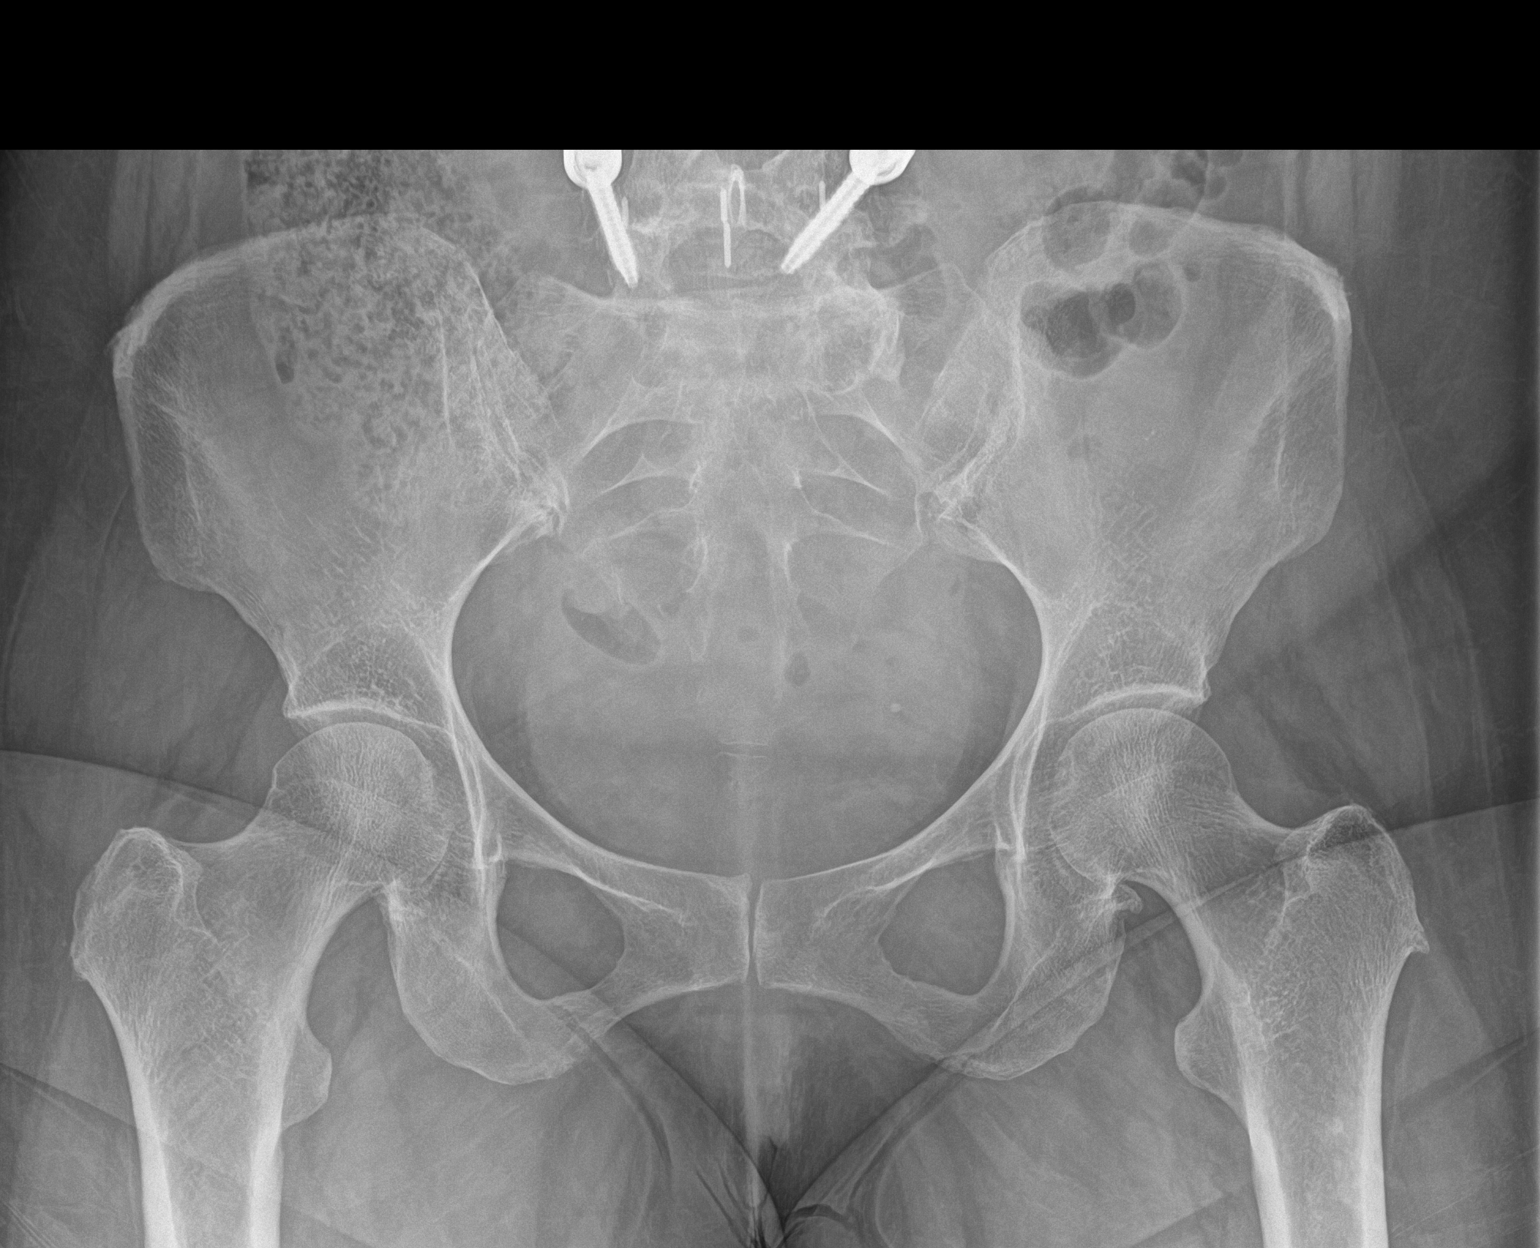

[hip ap]
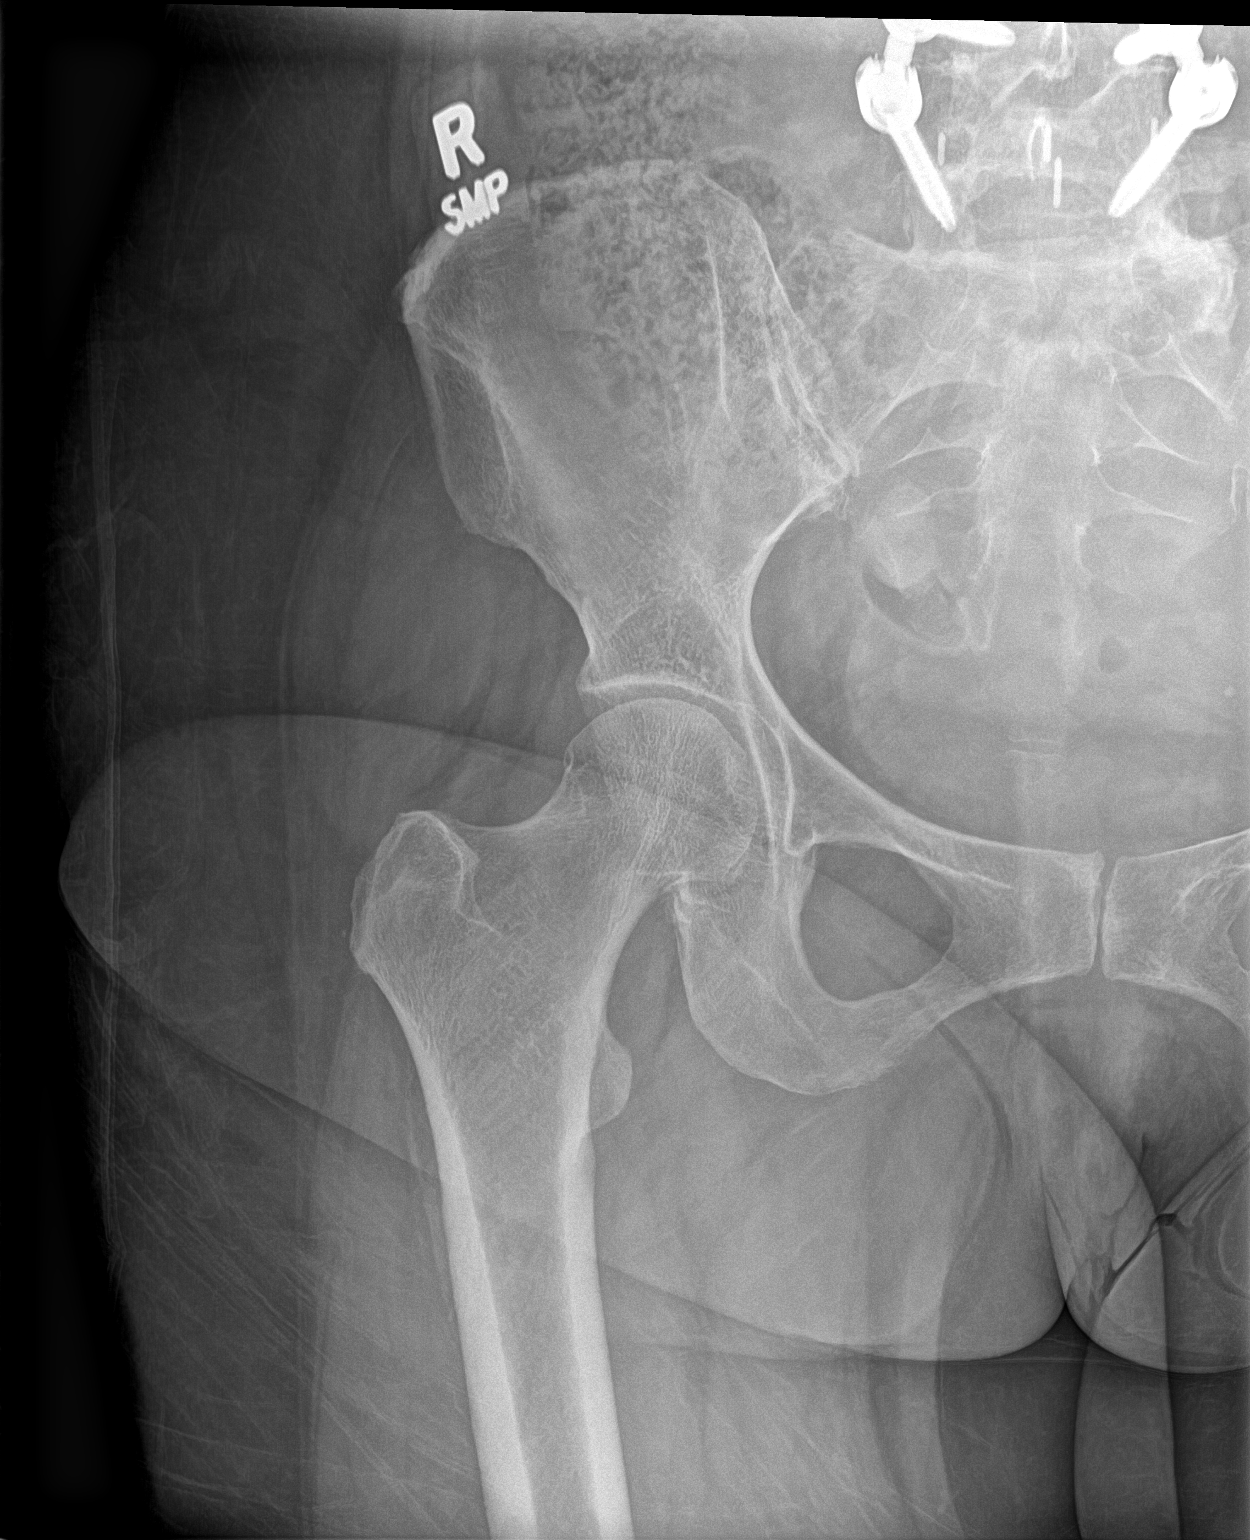

[hip frog leg]
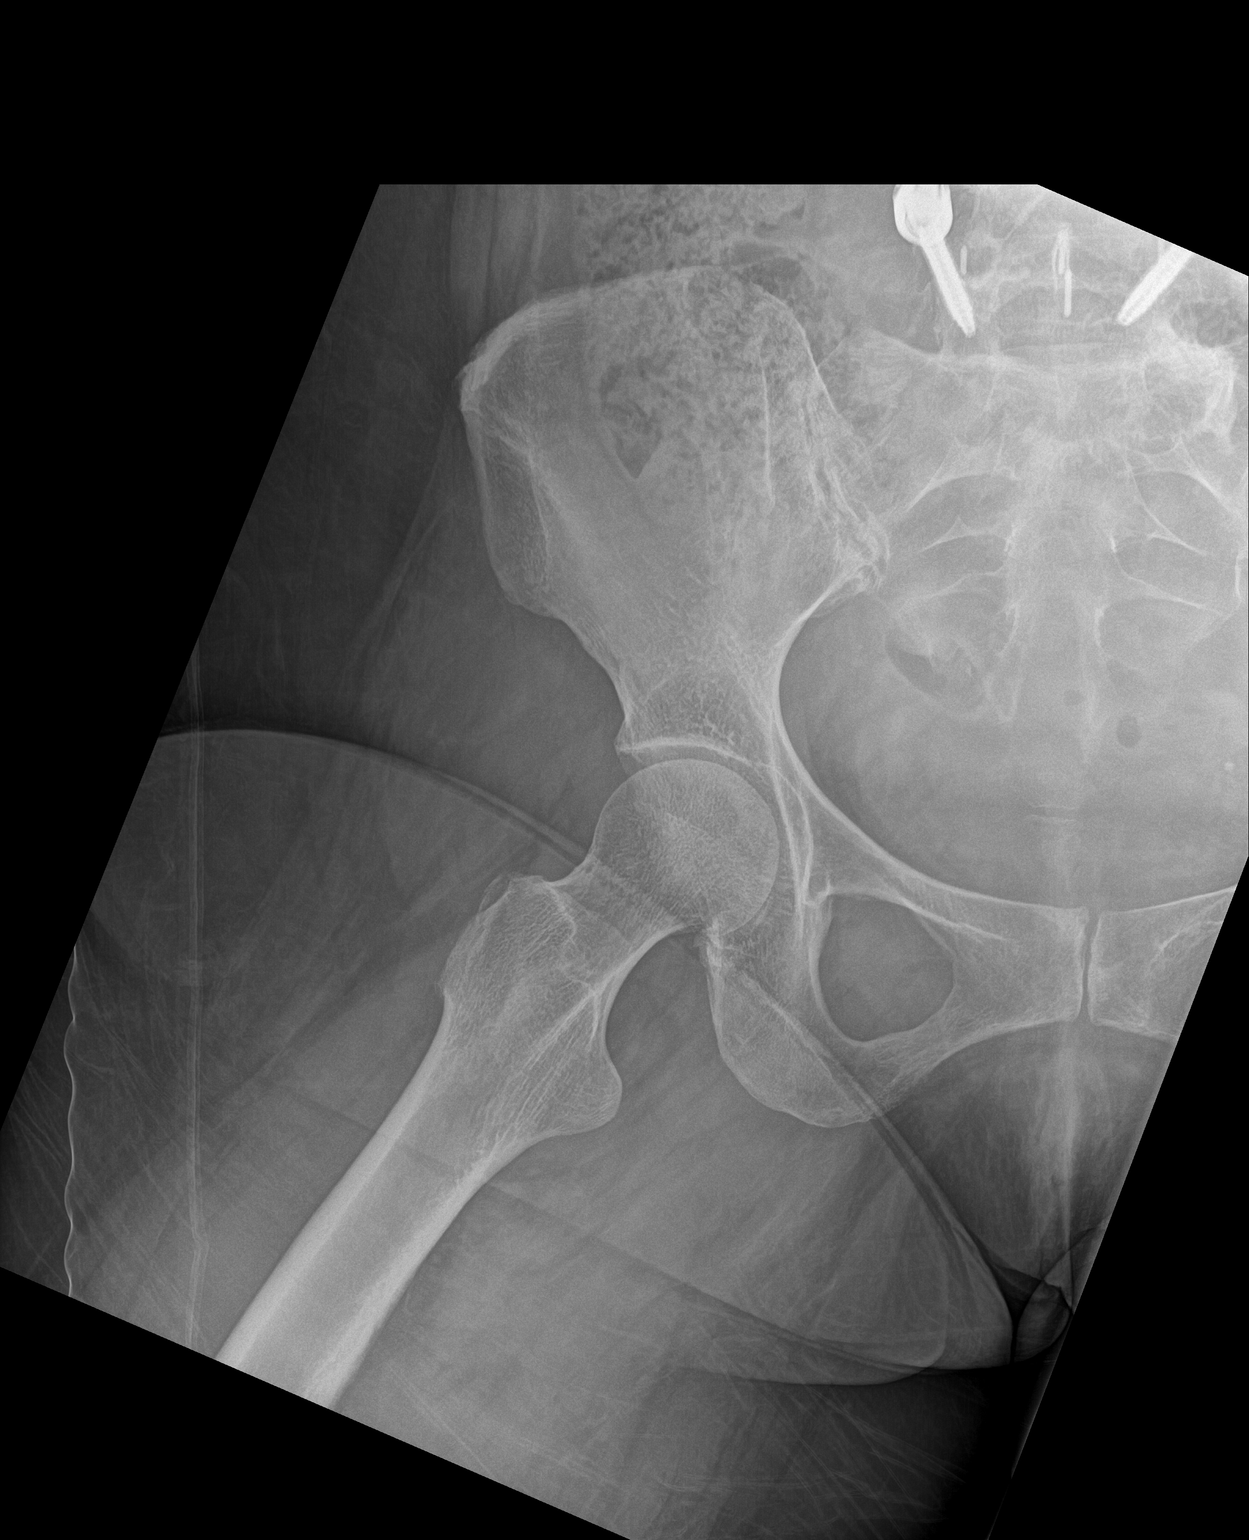

[3 of 3 positions shown; findings below may reference images not displayed]

FINDINGS: No pelvic fracture or diastasis. No right hip fracture or
dislocation. No suspicious focal osseous lesions. No significant hip
arthropathy. Bilateral posterior spinal fusion hardware in the lower
lumbar spine.
IMPRESSION: No fracture.

## 2022-07-13 IMAGING — DX DG LUMBAR SPINE COMPLETE 4+V
5 series · 5 of 5 positions shown · non-contrast
Comparison: 01/20/2018

CLINICAL DATA: Fall this morning with low back pain radiating to
right side.

EXAM:
LUMBAR SPINE - COMPLETE 4+ VIEW

[l-spine ap]
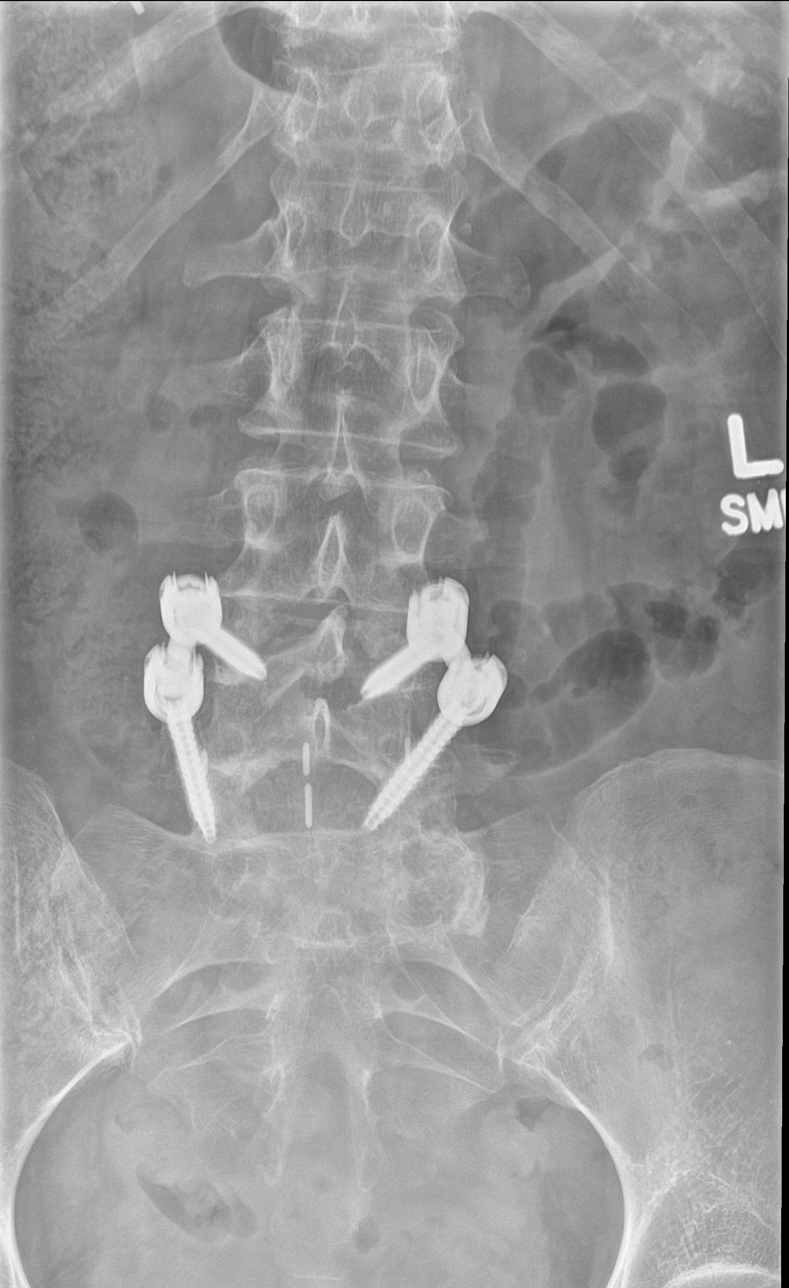

[l-spine obl (1 of 2)]
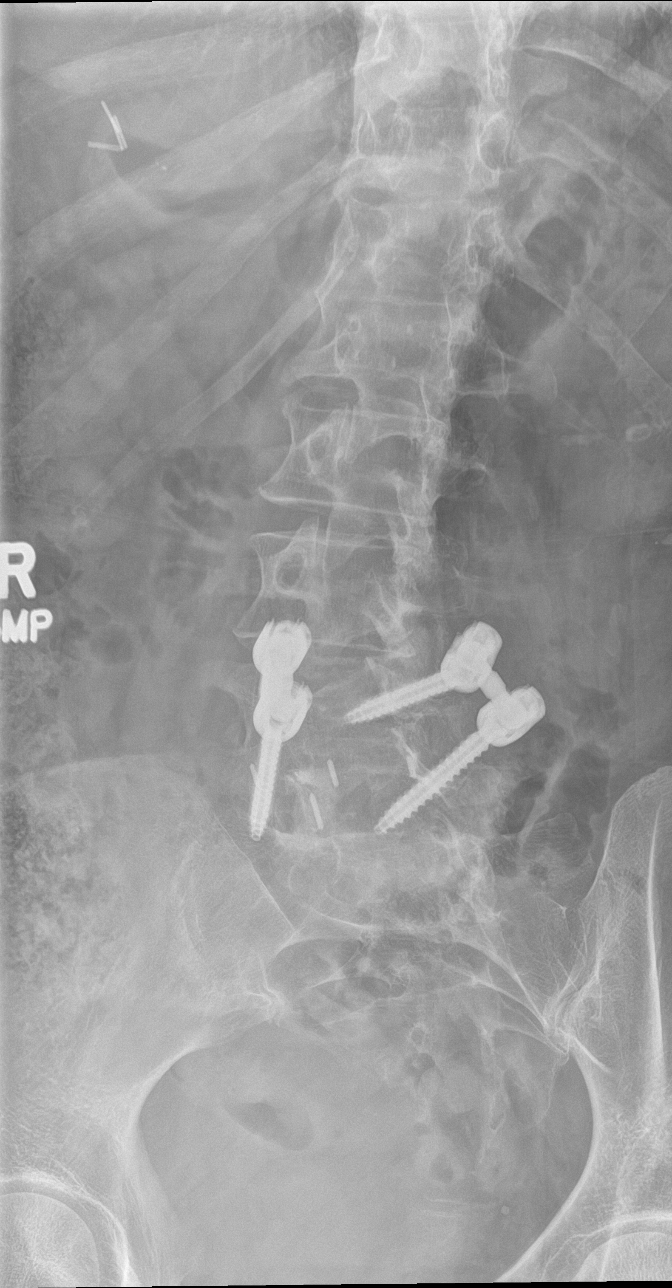

[l-spine obl (2 of 2)]
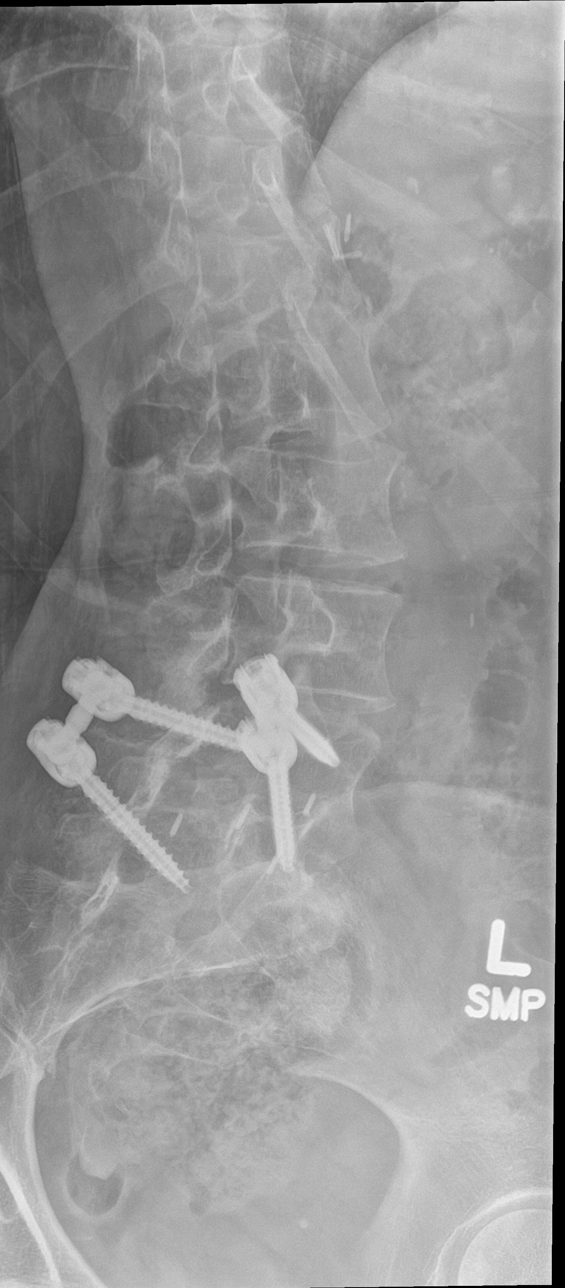

[l-spine lat]
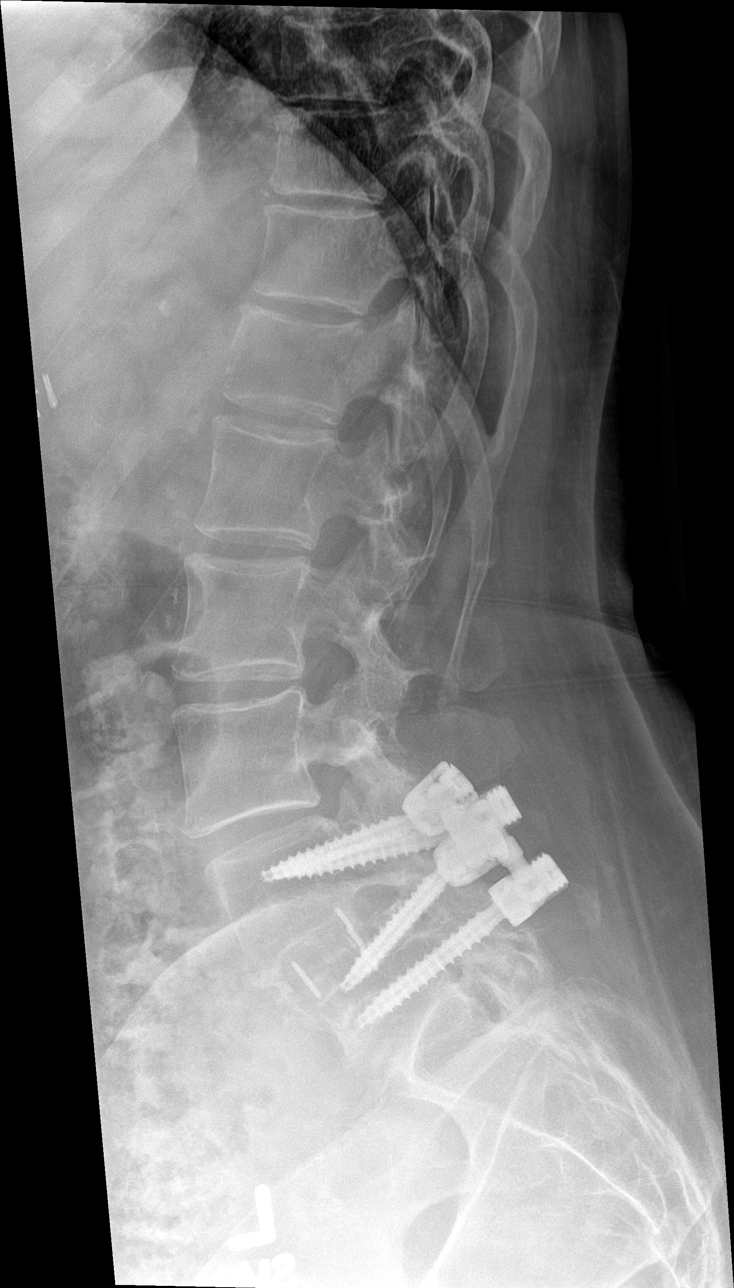

[l-spine spot]
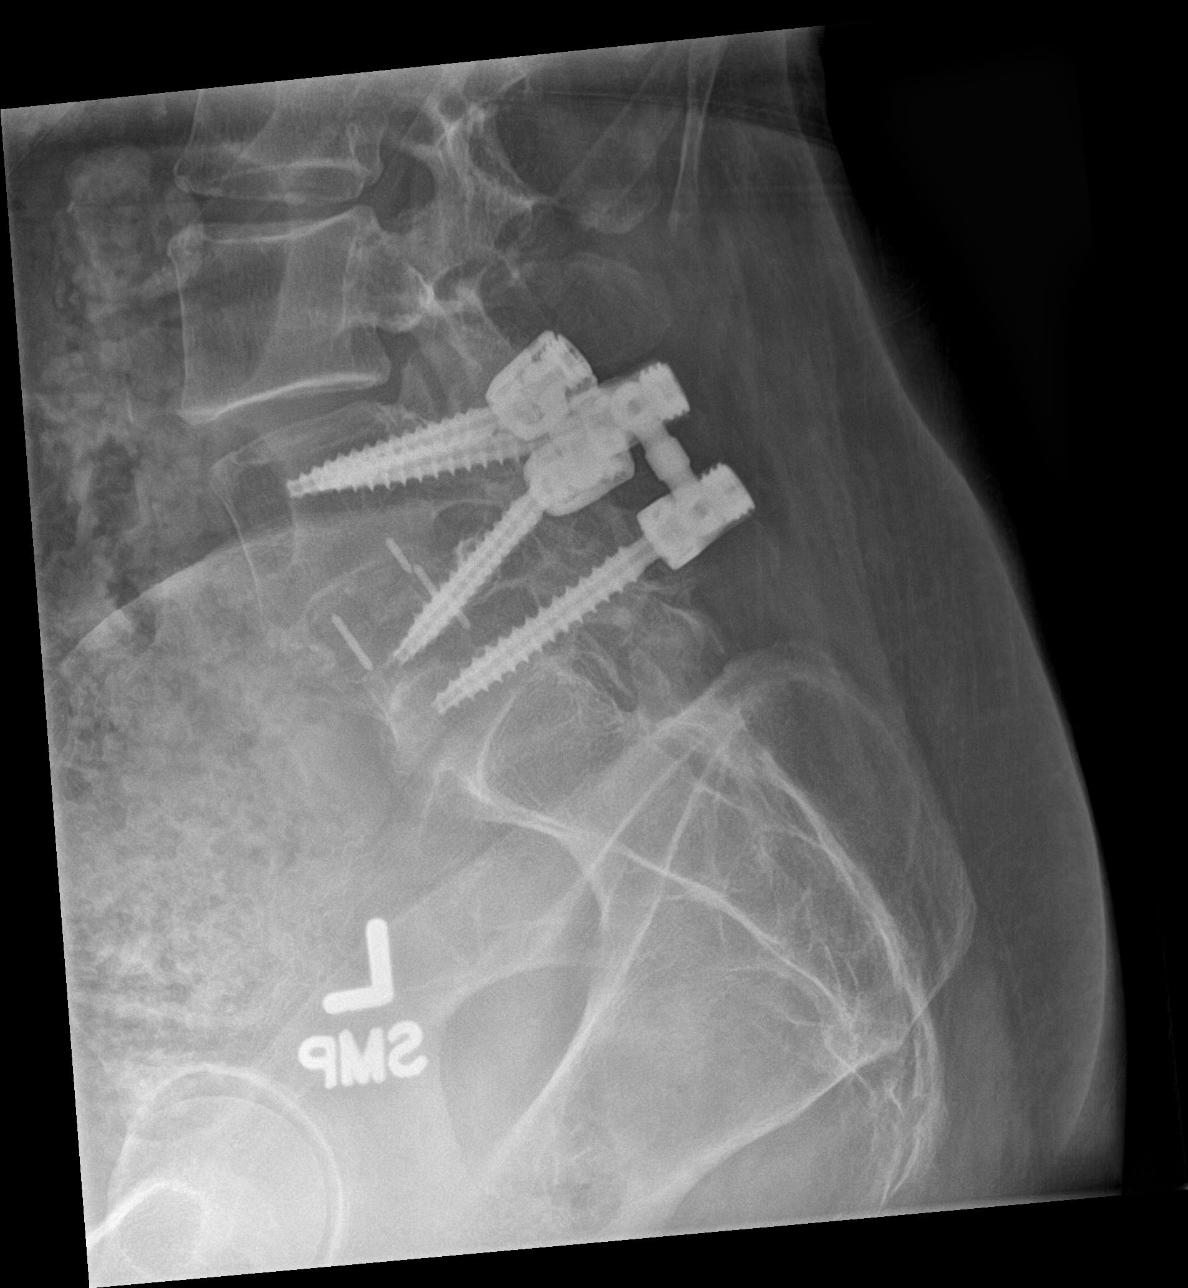

[5 of 5 positions shown; findings below may reference images not displayed]

FINDINGS: Vertebral body heights are normal. There is mild spondylosis
throughout the lumbar spine. Posterior fusion hardware intact and
unchanged at the L4-5 level. Interbody fusion at the L4-5 level.
Stable decreased height of L5. Possible subtle anterolisthesis of L4
on L5 unchanged. No acute compression fracture.
IMPRESSION: 1. No acute findings.
2. Mild spondylosis of the lumbar spine with posterior fusion
hardware intact and unchanged at the L4-5 level.

## 2022-07-31 ENCOUNTER — Ambulatory Visit (HOSPITAL_COMMUNITY): Payer: Medicare Other | Attending: Neurological Surgery | Admitting: Physical Therapy

## 2022-07-31 ENCOUNTER — Encounter (HOSPITAL_COMMUNITY): Payer: Self-pay | Admitting: Physical Therapy

## 2022-07-31 DIAGNOSIS — M546 Pain in thoracic spine: Secondary | ICD-10-CM | POA: Diagnosis not present

## 2022-07-31 NOTE — Therapy (Signed)
OUTPATIENT PHYSICAL THERAPY THORACOLUMBAR EVALUATION   Patient Name: Morgan Rowland MRN: [id number] DOB:1951-04-11, 71 y.o., female Today's Date: 07/31/2022   PT End of Session - 07/31/22 1105     Visit Number 1    Number of Visits 12    Date for PT Re-Evaluation 09/11/22    Authorization Type BCBS Medicare    PT Start Time 1108    PT Stop Time 1200    PT Time Calculation (min) 52 min    Activity Tolerance Patient tolerated treatment well    Behavior During Therapy [facility] for tasks assessed/performed             Past Medical History:  Diagnosis Date   Atypical mole 08/14/2010   left shin tx mohs (atypical prolif.)   Atypical nevi 08/28/2010   left heel mild/mod. no tx   Cervical disc disease    Chronic pain syndrome    Constipation 04/21/2017   Diverticulitis    External hemorrhoids    GERD (gastroesophageal reflux disease)    Hemangioma    cervical; MRI 01/2019 Dr. Glenna Fellows   High cholesterol    Hypercholesteremia    IBS (irritable bowel syndrome)    Lumbar spondylosis with myelopathy    Lumbosacral pain, chronic    Numbness and tingling of right arm    Osteoporosis    Parotid tumor    Past Surgical History:  Procedure Laterality Date   APPENDECTOMY     CAROTID ENDARTERECTOMY     CHOLECYSTECTOMY     COLONOSCOPY  2011   Dr Laural Golden in [place]     Left 12/2016, 08/2017   Spinal fusion x 2     TONSILLECTOMY     TOTAL ABDOMINAL HYSTERECTOMY     Patient Active Problem List   Diagnosis Date Noted   Constipation 04/21/2017   Diverticulitis 03/02/2012   High cholesterol 03/02/2012    PCP: [facility]  REFERRING PROVIDER: Judith Part, MD   REFERRING DIAG: S22.009A closed fx of thoracic vertebra, unspecified fx morphology   Rationale for Evaluation and Treatment Rehabilitation  THERAPY DIAG:  Pain in thoracic spine  ONSET DATE: April 7 of 2023  SUBJECTIVE:                                                                                                                                                                                            SUBJECTIVE STATEMENT: Patient fell in April of 2023 resulting in a T12 compression fracture. Reports chronic back pain prior to this with hx of lumbar fusion x2.  Symptoms worse with prolonged standing, had  some radicular symptoms into BIL LES prior to fall but worse since, symptoms proximal to knee. Symptoms improve with sitting and lying / rest.  PERTINENT HISTORY:  Lumbar fusions x2, recent thoracic compression fx T12  PAIN:  Are you having pain? Yes: NPRS scale: 1/10 Pain location: back Pain description: Dull achy spams Aggravating factors: standing, walking, most upright activities Relieving factors: sitting and lying   PRECAUTIONS: None  WEIGHT BEARING RESTRICTIONS No  FALLS:  Has patient fallen in last 6 months? Yes. Number of falls 1  LIVING ENVIRONMENT: Lives with: lives with their family and lives with their spouse Lives in: House/apartment Stairs: Yes: External: 5 steps; can reach both Has following equipment at home: None  OCCUPATION: Retired - gardens, Doctor, general practice  PLOF: Independent, husband does cleaning and lifting  PATIENT GOALS Build strength, stability, reduce pain,    OBJECTIVE:   DIAGNOSTIC FINDINGS:  1. Subacute T12 vertebral body compression fracture with approximately 60% height loss and 4 mm of retropulsion of the superior posterior margin of the T12 vertebral body. 2. At L3-4 there is a broad-based disc bulge with a small central disc protrusion. Moderate bilateral facet arthropathy with bilateral facet effusions. Moderate-severe spinal stenosis. Mild bilateral foraminal stenosis. 3. Posterior lumbar interbody fusion and laminectomy at L4-5 without foraminal or central canal stenosis. 4. At L5-S1 there is a mild broad-based disc bulge eccentric towards the left. Mild left foraminal stenosis.  PATIENT SURVEYS:  FOTO  47%  SCREENING FOR RED FLAGS: Bowel or bladder incontinence: No Spinal tumors: No Cauda equina syndrome: No Compression fracture: Yes: T12 (referring diagnosis Abdominal aneurysm: No  COGNITION:  Overall cognitive status: Within functional limits for tasks assessed     SENSATION: WFL    POSTURE: rounded shoulders and forward head  UE ROM WNL  LUMBAR ROM:   Active  A/PROM  eval  Flexion Limited 15%  Extension Limited 80% P!  Right lateral flexion Limited 15%  Left lateral flexion WNL  Right rotation   Left rotation    (Blank rows = not tested)   LOWER EXTREMITY MMT:    MMT (Tested seated) Right eval Left eval  Hip flexion 4+ 4+  Hip extension 4 4  Hip abduction 5 5  Hip adduction    Hip internal rotation    Hip external rotation    Knee flexion 4+ 4+  Knee extension 5 5  Ankle dorsiflexion 5 5  Ankle plantarflexion    Ankle inversion    Ankle eversion     (Blank rows = not tested)  UPPER EXTREMITY MMT:  MMT Right eval Left eval  Shoulder flexion 5 4  Shoulder extension    Shoulder abduction 5 4  Shoulder adduction    Shoulder internal rotation 5 4+  Shoulder external rotation 5 4  Middle trapezius    Lower trapezius    Elbow flexion 5 5  Elbow extension 5 5  Wrist flexion    Wrist extension    Wrist ulnar deviation    Wrist radial deviation    Wrist pronation    Wrist supination    Grip strength (lbs)       FUNCTIONAL TESTS:  5 times sit to stand: 16.1 sec    TODAY'S TREATMENT  Eval MMT 5 time sit<>stand HEP   PATIENT EDUCATION:  Education details: HEP, POC, findings Person educated: Patient Education method: Explanation Education comprehension: verbalized understanding   HOME EXERCISE PROGRAM: Access Code: [email or web address] URL: [email or web address] Date: 07/31/2022 Prepared by: Candie Mile  Exercises - Supine Transversus Abdominis Bracing - Hands on Stomach  - 2 x daily - 7 x weekly - 2 sets - 10 reps -  2-3 sec  hold - Supine March  - 2 x daily - 7 x weekly - 2 sets - 10 reps - Supine Bridge  - 2 x daily - 7 x weekly - 2 sets - 10 reps - Clamshell  - 2 x daily - 7 x weekly - 2 sets - 10 reps  ASSESSMENT:  CLINICAL IMPRESSION: Patient is a 71 y.o. female who was seen today for physical therapy evaluation and treatment for thoracic back pain with hx of chronic lower back pain. Patient demonstrates limited lumbar ROM, decreased core and LE strength, preventing patient from completing ADLs and other functional activities. Patient will benefit from skilled PT services to address deficits listed above and improve tolerance with functional activities.     OBJECTIVE IMPAIRMENTS decreased activity tolerance, decreased knowledge of condition, decreased mobility, decreased ROM, decreased strength, increased muscle spasms, impaired flexibility, improper body mechanics, postural dysfunction, and pain.   ACTIVITY LIMITATIONS carrying, lifting, standing, and cleaning  PARTICIPATION LIMITATIONS: cleaning, community activity, and yard work  PERSONAL FACTORS Age, Fitness, and chronic back pain  are also affecting patient's functional outcome.   REHAB POTENTIAL: Good  CLINICAL DECISION MAKING: Stable/uncomplicated  EVALUATION COMPLEXITY: Low   GOALS: Goals reviewed with patient? Yes  SHORT TERM GOALS: Target date: 08/21/2022  Patient will be independent with initial HEP and self-management strategies to improve functional outcomes Baseline: Initiated  Goal status: INITIAL    LONG TERM GOALS: Target date: 09/11/2022  Patient will be independent with advanced HEP and self-management strategies to improve functional outcomes Baseline: Initiated Goal status: INITIAL  2.  Patient will improve FOTO score to predicted value to indicate improvement in functional outcomes Baseline: 47% Goal status: INITIAL  3.  Patient will report reduction of back pain to <4/10 with activity for improved quality of  life and ability to perform ADLs  Baseline: 8/10 with activity Goal status: INITIAL  4. Patient will have equal to or > 4+/5 MMT throughout Bil LE to improve ability to perform functional mobility, stair ambulation and ADLs.  Baseline: See above Goal status: INITIAL    PLAN: PT FREQUENCY: 2x/week  PT DURATION: 6 weeks  PLANNED INTERVENTIONS: Therapeutic exercises, Therapeutic activity, Neuromuscular re-education, Balance training, Gait training, Patient/Family education, Joint manipulation, Joint mobilization, Stair training, Aquatic Therapy, Dry Needling, Electrical stimulation, Spinal manipulation, Spinal mobilization, Cryotherapy, Moist heat, scar mobilization, Taping, Traction, Ultrasound, Biofeedback, Ionotophoresis '4mg'$ /ml Dexamethasone, and Manual therapy.   PLAN FOR NEXT SESSION: Progressive core and LE strengthening.  12:07 PM, 07/31/22 Josue Hector PT DPT  Physical Therapist with [facility]  [phone]

## 2022-08-02 ENCOUNTER — Ambulatory Visit (HOSPITAL_COMMUNITY): Payer: Medicare Other | Attending: Neurological Surgery

## 2022-08-02 DIAGNOSIS — M546 Pain in thoracic spine: Secondary | ICD-10-CM | POA: Insufficient documentation

## 2022-08-02 NOTE — Therapy (Signed)
OUTPATIENT PHYSICAL THERAPY THORACOLUMBAR TREATMENT  Patient Name: Glori EMYA PICADO MRN: [id number] DOB:Feb 05, 1951, 71 y.o., female Today's Date: 08/02/2022   PT End of Session - 08/02/22 0901     Visit Number 2    Number of Visits 12    Date for PT Re-Evaluation 09/11/22    Authorization Type BCBS Medicare    PT Start Time 0901    PT Stop Time 0944    PT Time Calculation (min) 43 min    Activity Tolerance Patient tolerated treatment well    Behavior During Therapy [facility] for tasks assessed/performed              Past Medical History:  Diagnosis Date   Atypical mole 08/14/2010   left shin tx mohs (atypical prolif.)   Atypical nevi 08/28/2010   left heel mild/mod. no tx   Cervical disc disease    Chronic pain syndrome    Constipation 04/21/2017   Diverticulitis    External hemorrhoids    GERD (gastroesophageal reflux disease)    Hemangioma    cervical; MRI 01/2019 Dr. Glenna Fellows   High cholesterol    Hypercholesteremia    IBS (irritable bowel syndrome)    Lumbar spondylosis with myelopathy    Lumbosacral pain, chronic    Numbness and tingling of right arm    Osteoporosis    Parotid tumor    Past Surgical History:  Procedure Laterality Date   APPENDECTOMY     CAROTID ENDARTERECTOMY     CHOLECYSTECTOMY     COLONOSCOPY  2011   Dr Laural Golden in [place]     Left 12/2016, 08/2017   Spinal fusion x 2     TONSILLECTOMY     TOTAL ABDOMINAL HYSTERECTOMY     Patient Active Problem List   Diagnosis Date Noted   Constipation 04/21/2017   Diverticulitis 03/02/2012   High cholesterol 03/02/2012    PCP: [facility]  REFERRING PROVIDER: Judith Part, MD   REFERRING DIAG: S22.009A closed fx of thoracic vertebra, unspecified fx morphology   Rationale for Evaluation and Treatment Rehabilitation  THERAPY DIAG:  Pain in thoracic spine  ONSET DATE: April 7 of 2023  SUBJECTIVE:                                                                                                                                                                                            SUBJECTIVE STATEMENT: No new issues today no new pain. 1/10 pain low back  Eval:Patient fell in April of 2023 resulting in a T12 compression fracture. Reports chronic back pain prior to this with  hx of lumbar fusion x2.  Symptoms worse with prolonged standing, had some radicular symptoms into BIL LES prior to fall but worse since, symptoms proximal to knee. Symptoms improve with sitting and lying / rest.  PERTINENT HISTORY:  Lumbar fusions x2, recent thoracic compression fx T12  PAIN:  Are you having pain? Yes: NPRS scale: 1/10 Pain location: back Pain description: Dull achy spams Aggravating factors: standing, walking, most upright activities Relieving factors: sitting and lying   PRECAUTIONS: None  WEIGHT BEARING RESTRICTIONS No  FALLS:  Has patient fallen in last 6 months? Yes. Number of falls 1  LIVING ENVIRONMENT: Lives with: lives with their family and lives with their spouse Lives in: House/apartment Stairs: Yes: External: 5 steps; can reach both Has following equipment at home: None  OCCUPATION: Retired - gardens, Doctor, general practice  PLOF: Independent, husband does cleaning and lifting  PATIENT GOALS Build strength, stability, reduce pain,    OBJECTIVE:   DIAGNOSTIC FINDINGS:  1. Subacute T12 vertebral body compression fracture with approximately 60% height loss and 4 mm of retropulsion of the superior posterior margin of the T12 vertebral body. 2. At L3-4 there is a broad-based disc bulge with a small central disc protrusion. Moderate bilateral facet arthropathy with bilateral facet effusions. Moderate-severe spinal stenosis. Mild bilateral foraminal stenosis. 3. Posterior lumbar interbody fusion and laminectomy at L4-5 without foraminal or central canal stenosis. 4. At L5-S1 there is a mild broad-based disc bulge eccentric towards the left. Mild  left foraminal stenosis.  PATIENT SURVEYS:  FOTO 47%  SCREENING FOR RED FLAGS: Bowel or bladder incontinence: No Spinal tumors: No Cauda equina syndrome: No Compression fracture: Yes: T12 (referring diagnosis Abdominal aneurysm: No  COGNITION:  Overall cognitive status: Within functional limits for tasks assessed     SENSATION: WFL    POSTURE: rounded shoulders and forward head  UE ROM WNL  LUMBAR ROM:   Active  A/PROM  eval  Flexion Limited 15%  Extension Limited 80% P!  Right lateral flexion Limited 15%  Left lateral flexion WNL  Right rotation   Left rotation    (Blank rows = not tested)   LOWER EXTREMITY MMT:    MMT (Tested seated) Right eval Left eval  Hip flexion 4+ 4+  Hip extension 4 4  Hip abduction 5 5  Hip adduction    Hip internal rotation    Hip external rotation    Knee flexion 4+ 4+  Knee extension 5 5  Ankle dorsiflexion 5 5  Ankle plantarflexion    Ankle inversion    Ankle eversion     (Blank rows = not tested)  UPPER EXTREMITY MMT:  MMT Right eval Left eval  Shoulder flexion 5 4  Shoulder extension    Shoulder abduction 5 4  Shoulder adduction    Shoulder internal rotation 5 4+  Shoulder external rotation 5 4  Middle trapezius    Lower trapezius    Elbow flexion 5 5  Elbow extension 5 5  Wrist flexion    Wrist extension    Wrist ulnar deviation    Wrist radial deviation    Wrist pronation    Wrist supination    Grip strength (lbs)       FUNCTIONAL TESTS:  5 times sit to stand: 16.1 sec    TODAY'S TREATMENT  08/02/22 Review of HEP and goals Supine: Transverse abdominis bracing 5" hold x 10 Transverse Abdominis bracing with march x 10 Bridge x 10 Bridge with ball hip adduction x 10  Bridge with hip abduction with belt x 10   Side lying: Clam 2 x 10  PATIENT EDUCATION:  Education details: HEP, POC, findings Person educated: Patient Education method: Explanation Education comprehension: verbalized  understanding   HOME EXERCISE PROGRAM: Access Code: [email or web address] Date: 08/02/2022 Exercises - Supine Bridge with Mini Swiss Ball Between Knees  - 2 x daily - 7 x weekly - 1 sets - 10 reps - Bridge with Hip Abduction and Resistance  - 2 x daily - 7 x weekly - 1 sets - 10 reps  Access Code: [email or web address] URL: [email or web address] Date: 07/31/2022 Prepared by: Candie Mile  Exercises - Supine Transversus Abdominis Bracing - Hands on Stomach  - 2 x daily - 7 x weekly - 2 sets - 10 reps - 2-3 sec  hold - Supine March  - 2 x daily - 7 x weekly - 2 sets - 10 reps - Supine Bridge  - 2 x daily - 7 x weekly - 2 sets - 10 reps - Clamshell  - 2 x daily - 7 x weekly - 2 sets - 10 reps  ASSESSMENT:  CLINICAL IMPRESSION: Today's session started with review of HEP and goals; patient verbalizes agreement with set rehab goals. Needed some cueing for proper TA contraction and breathing with exercise today.  Progressed bridging exercise without issue.  No increased pain with treatment. Patient will benefit from continued therapy services to address deficits and promote optimal function.    OBJECTIVE IMPAIRMENTS decreased activity tolerance, decreased knowledge of condition, decreased mobility, decreased ROM, decreased strength, increased muscle spasms, impaired flexibility, improper body mechanics, postural dysfunction, and pain.   ACTIVITY LIMITATIONS carrying, lifting, standing, and cleaning  PARTICIPATION LIMITATIONS: cleaning, community activity, and yard work  PERSONAL FACTORS Age, Fitness, and chronic back pain  are also affecting patient's functional outcome.   REHAB POTENTIAL: Good  CLINICAL DECISION MAKING: Stable/uncomplicated  EVALUATION COMPLEXITY: Low   GOALS: Goals reviewed with patient? Yes  SHORT TERM GOALS: Target date: 08/21/2022  Patient will be independent with initial HEP and self-management strategies to improve functional outcomes Baseline: Initiated  Goal  status: IN PROGRESS    LONG TERM GOALS: Target date: 09/11/2022  Patient will be independent with advanced HEP and self-management strategies to improve functional outcomes Baseline: Initiated Goal status: IN PROGRESS  2.  Patient will improve FOTO score to predicted value to indicate improvement in functional outcomes Baseline: 47% Goal status: IN PROGRESS  3.  Patient will report reduction of back pain to <4/10 with activity for improved quality of life and ability to perform ADLs  Baseline: 8/10 with activity Goal status: IN PROGRESS  4. Patient will have equal to or > 4+/5 MMT throughout Bil LE to improve ability to perform functional mobility, stair ambulation and ADLs.  Baseline: See above Goal status: IN PROGRESS    PLAN: PT FREQUENCY: 2x/week  PT DURATION: 6 weeks  PLANNED INTERVENTIONS: Therapeutic exercises, Therapeutic activity, Neuromuscular re-education, Balance training, Gait training, Patient/Family education, Joint manipulation, Joint mobilization, Stair training, Aquatic Therapy, Dry Needling, Electrical stimulation, Spinal manipulation, Spinal mobilization, Cryotherapy, Moist heat, scar mobilization, Taping, Traction, Ultrasound, Biofeedback, Ionotophoresis '4mg'$ /ml Dexamethasone, and Manual therapy.   PLAN FOR NEXT SESSION: Progressive core and LE strengthening.  9:52 AM, 08/02/22 Braxen Dobek Small Kharlie Bring MPT [place] [facility] [place] [phone]

## 2022-08-06 DIAGNOSIS — G894 Chronic pain syndrome: Secondary | ICD-10-CM | POA: Diagnosis not present

## 2022-08-06 DIAGNOSIS — R030 Elevated blood-pressure reading, without diagnosis of hypertension: Secondary | ICD-10-CM | POA: Diagnosis not present

## 2022-08-06 DIAGNOSIS — M545 Low back pain, unspecified: Secondary | ICD-10-CM | POA: Diagnosis not present

## 2022-08-06 DIAGNOSIS — M4716 Other spondylosis with myelopathy, lumbar region: Secondary | ICD-10-CM | POA: Diagnosis not present

## 2022-08-06 DIAGNOSIS — Z79899 Other long term (current) drug therapy: Secondary | ICD-10-CM | POA: Diagnosis not present

## 2022-08-16 ENCOUNTER — Ambulatory Visit (HOSPITAL_COMMUNITY): Payer: Medicare Other

## 2022-08-16 ENCOUNTER — Encounter (HOSPITAL_COMMUNITY): Payer: Self-pay

## 2022-08-16 DIAGNOSIS — M546 Pain in thoracic spine: Secondary | ICD-10-CM | POA: Diagnosis not present

## 2022-08-16 NOTE — Therapy (Signed)
OUTPATIENT PHYSICAL THERAPY THORACOLUMBAR TREATMENT  Patient Name: Morgan Rowland MRN: [id number] DOB:[birth year], 71 y.o., female Today's Date: 08/16/2022   PT End of Session - 08/16/22 1525     Visit Number 3    Number of Visits 12    Date for PT Re-Evaluation 09/11/22    Authorization Type BCBS Medicare    PT Start Time 1435    PT Stop Time 1515    PT Time Calculation (min) 40 min    Activity Tolerance Patient tolerated treatment well    Behavior During Therapy [facility] for tasks assessed/performed               Past Medical History:  Diagnosis Date   Atypical mole 08/14/2010   left shin tx mohs (atypical prolif.)   Atypical nevi 08/28/2010   left heel mild/mod. no tx   Cervical disc disease    Chronic pain syndrome    Constipation 04/21/2017   Diverticulitis    External hemorrhoids    GERD (gastroesophageal reflux disease)    Hemangioma    cervical; MRI 01/2019 Dr. Glenna Fellows   High cholesterol    Hypercholesteremia    IBS (irritable bowel syndrome)    Lumbar spondylosis with myelopathy    Lumbosacral pain, chronic    Numbness and tingling of right arm    Osteoporosis    Parotid tumor    Past Surgical History:  Procedure Laterality Date   APPENDECTOMY     CAROTID ENDARTERECTOMY     CHOLECYSTECTOMY     COLONOSCOPY  2011   Dr Laural Golden in [place]     Left 12/2016, 08/2017   Spinal fusion x 2     TONSILLECTOMY     TOTAL ABDOMINAL HYSTERECTOMY     Patient Active Problem List   Diagnosis Date Noted   Constipation 04/21/2017   Diverticulitis 03/02/2012   High cholesterol 03/02/2012    PCP: [facility]  REFERRING PROVIDER: Judith Part, MD   REFERRING DIAG: S22.009A closed fx of thoracic vertebra, unspecified fx morphology   Rationale for Evaluation and Treatment Rehabilitation  THERAPY DIAG:  Pain in thoracic spine  ONSET DATE: April 7 of 2023  SUBJECTIVE:                                                                                                                                                                                            SUBJECTIVE STATEMENT: Pt reports her back is tired, no reports of pain at entrance though does reports some increased pain following exercises.  Pt requested a band to add to HEP and wishes  to review some of the exercises today.  Eval:Patient fell in April of 2023 resulting in a T12 compression fracture. Reports chronic back pain prior to this with hx of lumbar fusion x2.  Symptoms worse with prolonged standing, had some radicular symptoms into BIL LES prior to fall but worse since, symptoms proximal to knee. Symptoms improve with sitting and lying / rest.  PERTINENT HISTORY:  Lumbar fusions x2, recent thoracic compression fx T12  PAIN:  Are you having pain? Yes: NPRS scale: 1/10 Pain location: back Pain description: Dull achy spams Aggravating factors: standing, walking, most upright activities Relieving factors: sitting and lying   PRECAUTIONS: None  WEIGHT BEARING RESTRICTIONS No  FALLS:  Has patient fallen in last 6 months? Yes. Number of falls 1  LIVING ENVIRONMENT: Lives with: lives with their family and lives with their spouse Lives in: House/apartment Stairs: Yes: External: 5 steps; can reach both Has following equipment at home: None  OCCUPATION: Retired - gardens, Doctor, general practice  PLOF: Independent, husband does cleaning and lifting  PATIENT GOALS Build strength, stability, reduce pain,    OBJECTIVE:   DIAGNOSTIC FINDINGS:  1. Subacute T12 vertebral body compression fracture with approximately 60% height loss and 4 mm of retropulsion of the superior posterior margin of the T12 vertebral body. 2. At L3-4 there is a broad-based disc bulge with a small central disc protrusion. Moderate bilateral facet arthropathy with bilateral facet effusions. Moderate-severe spinal stenosis. Mild bilateral foraminal stenosis. 3. Posterior lumbar interbody  fusion and laminectomy at L4-5 without foraminal or central canal stenosis. 4. At L5-S1 there is a mild broad-based disc bulge eccentric towards the left. Mild left foraminal stenosis.  PATIENT SURVEYS:  FOTO 47%  SCREENING FOR RED FLAGS: Bowel or bladder incontinence: No Spinal tumors: No Cauda equina syndrome: No Compression fracture: Yes: T12 (referring diagnosis Abdominal aneurysm: No  COGNITION:  Overall cognitive status: Within functional limits for tasks assessed     SENSATION: WFL    POSTURE: rounded shoulders and forward head  UE ROM WNL  LUMBAR ROM:   Active  A/PROM  eval  Flexion Limited 15%  Extension Limited 80% P!  Right lateral flexion Limited 15%  Left lateral flexion WNL  Right rotation   Left rotation    (Blank rows = not tested)   LOWER EXTREMITY MMT:    MMT (Tested seated) Right eval Left eval Right 08/16/22 Left  08/16/22  Hip flexion 4+ 4+    Hip extension 4 4    Hip abduction 5 5 Sidelying 4/5 Sidelying 4+/5  Hip adduction      Hip internal rotation      Hip external rotation      Knee flexion 4+ 4+    Knee extension 5 5    Ankle dorsiflexion 5 5    Ankle plantarflexion      Ankle inversion      Ankle eversion       (Blank rows = not tested)  UPPER EXTREMITY MMT:  MMT Right eval Left eval  Shoulder flexion 5 4  Shoulder extension    Shoulder abduction 5 4  Shoulder adduction    Shoulder internal rotation 5 4+  Shoulder external rotation 5 4  Middle trapezius    Lower trapezius    Elbow flexion 5 5  Elbow extension 5 5  Wrist flexion    Wrist extension    Wrist ulnar deviation    Wrist radial deviation    Wrist pronation    Wrist supination  Grip strength (lbs)       FUNCTIONAL TESTS:  5 times sit to stand: 16.1 sec    TODAY'S TREATMENT  08/16/22: Instructed log rolling Transverse abdominis bracing 5" hold x 2 min encouraged to tighten abdominal mm with exhale Bridge with hip abduction with GTB- given  GTB for HEP Sidelying: clamshell with GTB 10x 5" Seated: Wback 10x5"  Seated rows with GTB Standing: marching with ab set 10x 3" intermittent HHA  Squat 10x cueing for mechanics  Lunges 10x each  08/02/22 Review of HEP and goals Supine: Transverse abdominis bracing 5" hold x 10 Transverse Abdominis bracing with march x 10 Bridge x 10 Bridge with ball hip adduction x 10 Bridge with hip abduction with belt x 10   Side lying: Clam 2 x 10  PATIENT EDUCATION:  Education details: HEP, POC, findings Person educated: Patient Education method: Explanation Education comprehension: verbalized understanding   HOME EXERCISE PROGRAM: Access Code: [email or web address] 08/16/22:  seated/standing rows with GTB  Date: 08/02/2022 Exercises - Supine Bridge with Mini Swiss Ball Between Knees  - 2 x daily - 7 x weekly - 1 sets - 10 reps - Bridge with Hip Abduction and Resistance  - 2 x daily - 7 x weekly - 1 sets - 10 reps  Access Code: [email or web address] URL: [email or web address] Date: 07/31/2022 Prepared by: Candie Mile  Exercises - Supine Transversus Abdominis Bracing - Hands on Stomach  - 2 x daily - 7 x weekly - 2 sets - 10 reps - 2-3 sec  hold - Supine March  - 2 x daily - 7 x weekly - 2 sets - 10 reps - Supine Bridge  - 2 x daily - 7 x weekly - 2 sets - 10 reps - Clamshell  - 2 x daily - 7 x weekly - 2 sets - 10 reps    ASSESSMENT:  CLINICAL IMPRESSION: Instructed log rolling to reduce strain on lumbar mm, good mechanics following initial cueing.  Reviewed transversus abdonminal bracing, instructed to exhale during exertion with min cueing through session.  Pt given GTB to add to bridge and clamshell.  Pt educated on seated posture for thoracic/lumbar strengthening.  Added postural strengthening exercises with good tolerance.  Pt stated she has plans for yardwork over weekend, reviewed proper body mechanics for LE strengthening to reduce irritation of back, some cueing required for  mechanics continue this in following apts.  No reports of pain through session.     OBJECTIVE IMPAIRMENTS decreased activity tolerance, decreased knowledge of condition, decreased mobility, decreased ROM, decreased strength, increased muscle spasms, impaired flexibility, improper body mechanics, postural dysfunction, and pain.   ACTIVITY LIMITATIONS carrying, lifting, standing, and cleaning  PARTICIPATION LIMITATIONS: cleaning, community activity, and yard work  PERSONAL FACTORS Age, Fitness, and chronic back pain  are also affecting patient's functional outcome.   REHAB POTENTIAL: Good  CLINICAL DECISION MAKING: Stable/uncomplicated  EVALUATION COMPLEXITY: Low   GOALS: Goals reviewed with patient? Yes  SHORT TERM GOALS: Target date: 08/21/2022  Patient will be independent with initial HEP and self-management strategies to improve functional outcomes Baseline: Initiated  Goal status: IN PROGRESS    LONG TERM GOALS: Target date: 09/11/2022  Patient will be independent with advanced HEP and self-management strategies to improve functional outcomes Baseline: Initiated Goal status: IN PROGRESS  2.  Patient will improve FOTO score to predicted value to indicate improvement in functional outcomes Baseline: 47% Goal status: IN PROGRESS  3.  Patient will report reduction of back pain to <4/10 with  activity for improved quality of life and ability to perform ADLs  Baseline: 8/10 with activity Goal status: IN PROGRESS  4. Patient will have equal to or > 4+/5 MMT throughout Bil LE to improve ability to perform functional mobility, stair ambulation and ADLs.  Baseline: See above Goal status: IN PROGRESS    PLAN: PT FREQUENCY: 2x/week  PT DURATION: 6 weeks  PLANNED INTERVENTIONS: Therapeutic exercises, Therapeutic activity, Neuromuscular re-education, Balance training, Gait training, Patient/Family education, Joint manipulation, Joint mobilization, Stair training, Aquatic  Therapy, Dry Needling, Electrical stimulation, Spinal manipulation, Spinal mobilization, Cryotherapy, Moist heat, scar mobilization, Taping, Traction, Ultrasound, Biofeedback, Ionotophoresis '4mg'$ /ml Dexamethasone, and Manual therapy.   PLAN FOR NEXT SESSION: Progressive core and LE strengthening.  Begin/progress postural strengthening, give pt band to hold in doorway when in stock.  Also add balance activities as appropriate.  Ihor Austin, LPTA/CLT; Delana Meyer [phone]  3:26 PM, 08/16/22

## 2022-08-21 ENCOUNTER — Encounter (HOSPITAL_COMMUNITY): Payer: Self-pay

## 2022-08-21 ENCOUNTER — Ambulatory Visit (HOSPITAL_COMMUNITY): Payer: Medicare Other

## 2022-08-21 DIAGNOSIS — M546 Pain in thoracic spine: Secondary | ICD-10-CM | POA: Diagnosis not present

## 2022-08-21 NOTE — Therapy (Signed)
OUTPATIENT PHYSICAL THERAPY THORACOLUMBAR TREATMENT  Patient Name: Morgan Rowland MRN: [id number] DOB:1951-05-11, 71 y.o., female Today's Date: 08/21/2022   PT End of Session - 08/21/22 1434     Visit Number 4    Number of Visits 12    Date for PT Re-Evaluation 09/11/22    Authorization Type BCBS Medicare    PT Start Time 1351    PT Stop Time 1430    PT Time Calculation (min) 39 min    Activity Tolerance Patient tolerated treatment well    Behavior During Therapy [facility] for tasks assessed/performed                Past Medical History:  Diagnosis Date   Atypical mole 08/14/2010   left shin tx mohs (atypical prolif.)   Atypical nevi 08/28/2010   left heel mild/mod. no tx   Cervical disc disease    Chronic pain syndrome    Constipation 04/21/2017   Diverticulitis    External hemorrhoids    GERD (gastroesophageal reflux disease)    Hemangioma    cervical; MRI 01/2019 Dr. Glenna Fellows   High cholesterol    Hypercholesteremia    IBS (irritable bowel syndrome)    Lumbar spondylosis with myelopathy    Lumbosacral pain, chronic    Numbness and tingling of right arm    Osteoporosis    Parotid tumor    Past Surgical History:  Procedure Laterality Date   APPENDECTOMY     CAROTID ENDARTERECTOMY     CHOLECYSTECTOMY     COLONOSCOPY  2011   Dr Laural Golden in [place]     Left 12/2016, 08/2017   Spinal fusion x 2     TONSILLECTOMY     TOTAL ABDOMINAL HYSTERECTOMY     Patient Active Problem List   Diagnosis Date Noted   Constipation 04/21/2017   Diverticulitis 03/02/2012   High cholesterol 03/02/2012    PCP: [facility]  REFERRING PROVIDER: Judith Part, MD  No apt scheduled  REFERRING DIAG: S22.009A closed fx of thoracic vertebra, unspecified fx morphology   Rationale for Evaluation and Treatment Rehabilitation  THERAPY DIAG:  Pain in thoracic spine  ONSET DATE: April 7 of 2023  SUBJECTIVE:                                                                                                                                                                                            SUBJECTIVE STATEMENT: Pt stated she is feeling positive results with PT, no reports of pain today.  Reports increased ease with gardening, able to complete over an hour though  did have some pain afterwards.  Reports she got a massage earlier today so nice and relaxed, goes to therapist once a month.    Eval:Patient fell in April of 2023 resulting in a T12 compression fracture. Reports chronic back pain prior to this with hx of lumbar fusion x2.  Symptoms worse with prolonged standing, had some radicular symptoms into BIL LES prior to fall but worse since, symptoms proximal to knee. Symptoms improve with sitting and lying / rest.  PERTINENT HISTORY:  Lumbar fusions x2, recent thoracic compression fx T12  PAIN:  Are you having pain? Yes: NPRS scale: 1/10 Pain location: back Pain description: Dull achy spams Aggravating factors: standing, walking, most upright activities Relieving factors: sitting and lying   PRECAUTIONS: None  WEIGHT BEARING RESTRICTIONS No  FALLS:  Has patient fallen in last 6 months? Yes. Number of falls 1  LIVING ENVIRONMENT: Lives with: lives with their family and lives with their spouse Lives in: House/apartment Stairs: Yes: External: 5 steps; can reach both Has following equipment at home: None  OCCUPATION: Retired - gardens, Doctor, general practice  PLOF: Independent, husband does cleaning and lifting  PATIENT GOALS Build strength, stability, reduce pain,    OBJECTIVE:   DIAGNOSTIC FINDINGS:  1. Subacute T12 vertebral body compression fracture with approximately 60% height loss and 4 mm of retropulsion of the superior posterior margin of the T12 vertebral body. 2. At L3-4 there is a broad-based disc bulge with a small central disc protrusion. Moderate bilateral facet arthropathy with bilateral facet effusions.  Moderate-severe spinal stenosis. Mild bilateral foraminal stenosis. 3. Posterior lumbar interbody fusion and laminectomy at L4-5 without foraminal or central canal stenosis. 4. At L5-S1 there is a mild broad-based disc bulge eccentric towards the left. Mild left foraminal stenosis.  PATIENT SURVEYS:  FOTO 47%  SCREENING FOR RED FLAGS: Bowel or bladder incontinence: No Spinal tumors: No Cauda equina syndrome: No Compression fracture: Yes: T12 (referring diagnosis Abdominal aneurysm: No  COGNITION:  Overall cognitive status: Within functional limits for tasks assessed     SENSATION: WFL    POSTURE: rounded shoulders and forward head  UE ROM WNL  LUMBAR ROM:   Active  A/PROM  eval  Flexion Limited 15%  Extension Limited 80% P!  Right lateral flexion Limited 15%  Left lateral flexion WNL  Right rotation   Left rotation    (Blank rows = not tested)   LOWER EXTREMITY MMT:    MMT (Tested seated) Right eval Left eval Right 08/16/22 Left  08/16/22  Hip flexion 4+ 4+    Hip extension 4 4    Hip abduction 5 5 Sidelying 4/5 Sidelying 4+/5  Hip adduction      Hip internal rotation      Hip external rotation      Knee flexion 4+ 4+    Knee extension 5 5    Ankle dorsiflexion 5 5    Ankle plantarflexion      Ankle inversion      Ankle eversion       (Blank rows = not tested)  UPPER EXTREMITY MMT:  MMT Right eval Left eval  Shoulder flexion 5 4  Shoulder extension    Shoulder abduction 5 4  Shoulder adduction    Shoulder internal rotation 5 4+  Shoulder external rotation 5 4  Middle trapezius    Lower trapezius    Elbow flexion 5 5  Elbow extension 5 5  Wrist flexion    Wrist extension    Wrist  ulnar deviation    Wrist radial deviation    Wrist pronation    Wrist supination    Grip strength (lbs)       FUNCTIONAL TESTS:  5 times sit to stand: 16.1 sec    TODAY'S TREATMENT  08/21/22: Supine: SLR with ab set Bridge 15x 5" Sidelying:  abduction Standing: squat front of mat  Lumbar extension March with ab set 15x 3" 1 hand hovering GTB shoulder 10x  Row 10x SLS Rt 20", Lt 16" max of 3 Forward then posterior lunges over foam on floor (progress to kneeling as able)  08/16/22: Instructed log rolling Transverse abdominis bracing 5" hold x 2 min encouraged to tighten abdominal mm with exhale Bridge with hip abduction with GTB- given GTB for HEP Sidelying: clamshell with GTB 10x 5" Seated: Wback 10x5"  Seated rows with GTB Standing: marching with ab set 10x 3" intermittent HHA  Squat 10x cueing for mechanics  Lunges 10x each  08/02/22 Review of HEP and goals Supine: Transverse abdominis bracing 5" hold x 10 Transverse Abdominis bracing with march x 10 Bridge x 10 Bridge with ball hip adduction x 10 Bridge with hip abduction with belt x 10   Side lying: Clam 2 x 10  PATIENT EDUCATION:  Education details: HEP, POC, findings Person educated: Patient Education method: Explanation Education comprehension: verbalized understanding   HOME EXERCISE PROGRAM: Access Code: [email or web address] 08/21/22: SLS   08/16/22:  seated/standing rows with GTB  Date: 08/02/2022 Exercises - Supine Bridge with Mini Swiss Ball Between Knees  - 2 x daily - 7 x weekly - 1 sets - 10 reps - Bridge with Hip Abduction and Resistance  - 2 x daily - 7 x weekly - 1 sets - 10 reps  Access Code: [email or web address] URL: [email or web address] Date: 07/31/2022 Prepared by: Candie Mile  Exercises - Supine Transversus Abdominis Bracing - Hands on Stomach  - 2 x daily - 7 x weekly - 2 sets - 10 reps - 2-3 sec  hold - Supine March  - 2 x daily - 7 x weekly - 2 sets - 10 reps - Supine Bridge  - 2 x daily - 7 x weekly - 2 sets - 10 reps - Clamshell  - 2 x daily - 7 x weekly - 2 sets - 10 reps    ASSESSMENT:  CLINICAL IMPRESSION: Pt progressing well with POC.  Added standing postural strengthening and hip strengthening exercises with good  tolerance.  Min cueing to improve mechanics with lunges to address good back stability in garden.  Added balance activities with intermittent HHA required.     OBJECTIVE IMPAIRMENTS decreased activity tolerance, decreased knowledge of condition, decreased mobility, decreased ROM, decreased strength, increased muscle spasms, impaired flexibility, improper body mechanics, postural dysfunction, and pain.   ACTIVITY LIMITATIONS carrying, lifting, standing, and cleaning  PARTICIPATION LIMITATIONS: cleaning, community activity, and yard work  PERSONAL FACTORS Age, Fitness, and chronic back pain  are also affecting patient's functional outcome.   REHAB POTENTIAL: Good  CLINICAL DECISION MAKING: Stable/uncomplicated  EVALUATION COMPLEXITY: Low   GOALS: Goals reviewed with patient? Yes  SHORT TERM GOALS: Target date: 08/21/2022  Patient will be independent with initial HEP and self-management strategies to improve functional outcomes Baseline: Initiated  Goal status: IN PROGRESS    LONG TERM GOALS: Target date: 09/11/2022  Patient will be independent with advanced HEP and self-management strategies to improve functional outcomes Baseline: Initiated Goal status: IN PROGRESS  2.  Patient will improve FOTO score to predicted  value to indicate improvement in functional outcomes Baseline: 47% Goal status: IN PROGRESS  3.  Patient will report reduction of back pain to <4/10 with activity for improved quality of life and ability to perform ADLs  Baseline: 8/10 with activity Goal status: IN PROGRESS  4. Patient will have equal to or > 4+/5 MMT throughout Bil LE to improve ability to perform functional mobility, stair ambulation and ADLs.  Baseline: See above Goal status: IN PROGRESS    PLAN: PT FREQUENCY: 2x/week  PT DURATION: 6 weeks  PLANNED INTERVENTIONS: Therapeutic exercises, Therapeutic activity, Neuromuscular re-education, Balance training, Gait training, Patient/Family  education, Joint manipulation, Joint mobilization, Stair training, Aquatic Therapy, Dry Needling, Electrical stimulation, Spinal manipulation, Spinal mobilization, Cryotherapy, Moist heat, scar mobilization, Taping, Traction, Ultrasound, Biofeedback, Ionotophoresis '4mg'$ /ml Dexamethasone, and Manual therapy.   PLAN FOR NEXT SESSION: Progressive core and LE strengthening. Also add balance activities as appropriate.  Ihor Austin, LPTA/CLT; CBIS [phone]  3:35 PM, 08/21/22

## 2022-08-27 ENCOUNTER — Ambulatory Visit (HOSPITAL_COMMUNITY): Payer: Medicare Other

## 2022-08-27 DIAGNOSIS — M546 Pain in thoracic spine: Secondary | ICD-10-CM

## 2022-08-27 NOTE — Therapy (Signed)
OUTPATIENT PHYSICAL THERAPY THORACOLUMBAR TREATMENT  Patient Name: Morgan Rowland MRN: [id number] DOB:01/21/1951, 71 y.o., female Today's Date: 08/27/2022   PT End of Session - 08/27/22 1519     Visit Number 5    Number of Visits 12    Date for PT Re-Evaluation 09/11/22    Authorization Type BCBS Medicare    PT Start Time 1518    PT Stop Time 1600    PT Time Calculation (min) 42 min    Activity Tolerance Patient tolerated treatment well    Behavior During Therapy [facility] for tasks assessed/performed                 Past Medical History:  Diagnosis Date   Atypical mole 08/14/2010   left shin tx mohs (atypical prolif.)   Atypical nevi 08/28/2010   left heel mild/mod. no tx   Cervical disc disease    Chronic pain syndrome    Constipation 04/21/2017   Diverticulitis    External hemorrhoids    GERD (gastroesophageal reflux disease)    Hemangioma    cervical; MRI 01/2019 Dr. Glenna Fellows   High cholesterol    Hypercholesteremia    IBS (irritable bowel syndrome)    Lumbar spondylosis with myelopathy    Lumbosacral pain, chronic    Numbness and tingling of right arm    Osteoporosis    Parotid tumor    Past Surgical History:  Procedure Laterality Date   APPENDECTOMY     CAROTID ENDARTERECTOMY     CHOLECYSTECTOMY     COLONOSCOPY  2011   Dr Laural Golden in [place]     Left 12/2016, 08/2017   Spinal fusion x 2     TONSILLECTOMY     TOTAL ABDOMINAL HYSTERECTOMY     Patient Active Problem List   Diagnosis Date Noted   Constipation 04/21/2017   Diverticulitis 03/02/2012   High cholesterol 03/02/2012    PCP: [facility]  REFERRING PROVIDER: Judith Part, MD  No apt scheduled  REFERRING DIAG: S22.009A closed fx of thoracic vertebra, unspecified fx morphology   Rationale for Evaluation and Treatment Rehabilitation  THERAPY DIAG:  Pain in thoracic spine  ONSET DATE: April 7 of 2023  SUBJECTIVE:                                                                                                                                                                                            SUBJECTIVE STATEMENT: Patient did some cleaning this morning so her back is a little sore and "twingy" today.   Eval:Patient fell in April of 2023 resulting in  a T12 compression fracture. Reports chronic back pain prior to this with hx of lumbar fusion x2.  Symptoms worse with prolonged standing, had some radicular symptoms into BIL LES prior to fall but worse since, symptoms proximal to knee. Symptoms improve with sitting and lying / rest.  PERTINENT HISTORY:  Lumbar fusions x2, recent thoracic compression fx T12  PAIN:  Are you having pain? Yes: NPRS scale: 2/10 Pain location: back Pain description: Dull achy spams Aggravating factors: standing, walking, most upright activities Relieving factors: sitting and lying   PRECAUTIONS: None  WEIGHT BEARING RESTRICTIONS No  FALLS:  Has patient fallen in last 6 months? Yes. Number of falls 1  LIVING ENVIRONMENT: Lives with: lives with their family and lives with their spouse Lives in: House/apartment Stairs: Yes: External: 5 steps; can reach both Has following equipment at home: None  OCCUPATION: Retired - gardens, Doctor, general practice  PLOF: Independent, husband does cleaning and lifting  PATIENT GOALS Build strength, stability, reduce pain,    OBJECTIVE:   DIAGNOSTIC FINDINGS:  1. Subacute T12 vertebral body compression fracture with approximately 60% height loss and 4 mm of retropulsion of the superior posterior margin of the T12 vertebral body. 2. At L3-4 there is a broad-based disc bulge with a small central disc protrusion. Moderate bilateral facet arthropathy with bilateral facet effusions. Moderate-severe spinal stenosis. Mild bilateral foraminal stenosis. 3. Posterior lumbar interbody fusion and laminectomy at L4-5 without foraminal or central canal stenosis. 4. At L5-S1 there is  a mild broad-based disc bulge eccentric towards the left. Mild left foraminal stenosis.  PATIENT SURVEYS:  FOTO 47%  SCREENING FOR RED FLAGS: Bowel or bladder incontinence: No Spinal tumors: No Cauda equina syndrome: No Compression fracture: Yes: T12 (referring diagnosis Abdominal aneurysm: No  COGNITION:  Overall cognitive status: Within functional limits for tasks assessed     SENSATION: WFL    POSTURE: rounded shoulders and forward head  UE ROM WNL  LUMBAR ROM:   Active  A/PROM  eval  Flexion Limited 15%  Extension Limited 80% P!  Right lateral flexion Limited 15%  Left lateral flexion WNL  Right rotation   Left rotation    (Blank rows = not tested)   LOWER EXTREMITY MMT:    MMT (Tested seated) Right eval Left eval Right 08/16/22 Left  08/16/22  Hip flexion 4+ 4+    Hip extension 4 4    Hip abduction 5 5 Sidelying 4/5 Sidelying 4+/5  Hip adduction      Hip internal rotation      Hip external rotation      Knee flexion 4+ 4+    Knee extension 5 5    Ankle dorsiflexion 5 5    Ankle plantarflexion      Ankle inversion      Ankle eversion       (Blank rows = not tested)  UPPER EXTREMITY MMT:  MMT Right eval Left eval  Shoulder flexion 5 4  Shoulder extension    Shoulder abduction 5 4  Shoulder adduction    Shoulder internal rotation 5 4+  Shoulder external rotation 5 4  Middle trapezius    Lower trapezius    Elbow flexion 5 5  Elbow extension 5 5  Wrist flexion    Wrist extension    Wrist ulnar deviation    Wrist radial deviation    Wrist pronation    Wrist supination    Grip strength (lbs)       FUNCTIONAL TESTS:  5  times sit to stand: 16.1 sec    TODAY'S TREATMENT  08/27/22 Supine: Bridge x 15 Bridge with march x 10  Abdominal bracing with SLR x 10 each  Sidelying: Clam GTB 2 x 10 each  Standing: Squat front of mat 2 x 10 Hip vectors 3" hold x 5 each RTB shoulder rows 2 x 10 RTB shoulder extensions 2 x 10 Lumbar  extensions over // bars x 10 Heel raises 2 x 10    08/21/22: Supine: SLR with ab set Bridge 15x 5" Sidelying: abduction Standing: squat front of mat  Lumbar extension March with ab set 15x 3" 1 hand hovering GTB shoulder 10x  Row 10x SLS Rt 20", Lt 16" max of 3 Forward then posterior lunges over foam on floor (progress to kneeling as able)  08/16/22: Instructed log rolling Transverse abdominis bracing 5" hold x 2 min encouraged to tighten abdominal mm with exhale Bridge with hip abduction with GTB- given GTB for HEP Sidelying: clamshell with GTB 10x 5" Seated: Wback 10x5"  Seated rows with GTB Standing: marching with ab set 10x 3" intermittent HHA  Squat 10x cueing for mechanics  Lunges 10x each  08/02/22 Review of HEP and goals Supine: Transverse abdominis bracing 5" hold x 10 Transverse Abdominis bracing with march x 10 Bridge x 10 Bridge with ball hip adduction x 10 Bridge with hip abduction with belt x 10   Side lying: Clam 2 x 10  PATIENT EDUCATION:  Education details: HEP, POC, findings Person educated: Patient Education method: Explanation Education comprehension: verbalized understanding   HOME EXERCISE PROGRAM: Access Code: [email or web address] 08/21/22: SLS   08/16/22:  seated/standing rows with GTB  Date: 08/02/2022 Exercises - Supine Bridge with Mini Swiss Ball Between Knees  - 2 x daily - 7 x weekly - 1 sets - 10 reps - Bridge with Hip Abduction and Resistance  - 2 x daily - 7 x weekly - 1 sets - 10 reps  Access Code: [email or web address] URL: [email or web address] Date: 07/31/2022 Prepared by: Candie Mile  Exercises - Supine Transversus Abdominis Bracing - Hands on Stomach  - 2 x daily - 7 x weekly - 2 sets - 10 reps - 2-3 sec  hold - Supine March  - 2 x daily - 7 x weekly - 2 sets - 10 reps - Supine Bridge  - 2 x daily - 7 x weekly - 2 sets - 10 reps - Clamshell  - 2 x daily - 7 x weekly - 2 sets - 10 reps    ASSESSMENT:  CLINICAL  IMPRESSION: Today's session continued to focus on postural strengthening. Added hip vectors to treatment today.fatigues quickly with vectors and needs cues to avoid trunk substitution; still tight with lumbar extension. Reports mod to max fatigue at end of treatment Patient needs verbal cues for technique and to avoid trunk substitution.    OBJECTIVE IMPAIRMENTS decreased activity tolerance, decreased knowledge of condition, decreased mobility, decreased ROM, decreased strength, increased muscle spasms, impaired flexibility, improper body mechanics, postural dysfunction, and pain.   ACTIVITY LIMITATIONS carrying, lifting, standing, and cleaning  PARTICIPATION LIMITATIONS: cleaning, community activity, and yard work  PERSONAL FACTORS Age, Fitness, and chronic back pain  are also affecting patient's functional outcome.   REHAB POTENTIAL: Good  CLINICAL DECISION MAKING: Stable/uncomplicated  EVALUATION COMPLEXITY: Low   GOALS: Goals reviewed with patient? Yes  SHORT TERM GOALS: Target date: 08/21/2022  Patient will be independent with initial HEP and self-management strategies to improve functional outcomes Baseline: Initiated  Goal  status: IN PROGRESS    LONG TERM GOALS: Target date: 09/11/2022  Patient will be independent with advanced HEP and self-management strategies to improve functional outcomes Baseline: Initiated Goal status: IN PROGRESS  2.  Patient will improve FOTO score to predicted value to indicate improvement in functional outcomes Baseline: 47% Goal status: IN PROGRESS  3.  Patient will report reduction of back pain to <4/10 with activity for improved quality of life and ability to perform ADLs  Baseline: 8/10 with activity Goal status: IN PROGRESS  4. Patient will have equal to or > 4+/5 MMT throughout Bil LE to improve ability to perform functional mobility, stair ambulation and ADLs.  Baseline: See above Goal status: IN PROGRESS    PLAN: PT FREQUENCY:  2x/week  PT DURATION: 6 weeks  PLANNED INTERVENTIONS: Therapeutic exercises, Therapeutic activity, Neuromuscular re-education, Balance training, Gait training, Patient/Family education, Joint manipulation, Joint mobilization, Stair training, Aquatic Therapy, Dry Needling, Electrical stimulation, Spinal manipulation, Spinal mobilization, Cryotherapy, Moist heat, scar mobilization, Taping, Traction, Ultrasound, Biofeedback, Ionotophoresis '4mg'$ /ml Dexamethasone, and Manual therapy.   PLAN FOR NEXT SESSION: Progressive core and LE strengthening. Also add balance activities as appropriate.  3:19 PM, 08/27/22 Tameko Halder Small Tiziana Cislo MPT [facility] [place] [phone]

## 2022-08-29 ENCOUNTER — Ambulatory Visit (HOSPITAL_COMMUNITY): Payer: Medicare Other | Admitting: Physical Therapy

## 2022-08-29 ENCOUNTER — Encounter (HOSPITAL_COMMUNITY): Payer: Self-pay | Admitting: Physical Therapy

## 2022-08-29 DIAGNOSIS — I10 Essential (primary) hypertension: Secondary | ICD-10-CM | POA: Diagnosis not present

## 2022-08-29 DIAGNOSIS — J449 Chronic obstructive pulmonary disease, unspecified: Secondary | ICD-10-CM | POA: Diagnosis not present

## 2022-08-29 DIAGNOSIS — M546 Pain in thoracic spine: Secondary | ICD-10-CM

## 2022-08-29 DIAGNOSIS — Z0001 Encounter for general adult medical examination with abnormal findings: Secondary | ICD-10-CM | POA: Diagnosis not present

## 2022-08-29 DIAGNOSIS — E7801 Familial hypercholesterolemia: Secondary | ICD-10-CM | POA: Diagnosis not present

## 2022-08-29 NOTE — Therapy (Signed)
OUTPATIENT PHYSICAL THERAPY THORACOLUMBAR TREATMENT  Patient Name: Morgan Rowland MRN: [id number] DOB:[birth year], 71 y.o., female Today's Date: 08/29/2022   PT End of Session - 08/29/22 0943     Visit Number 6    Number of Visits 12    Date for PT Re-Evaluation 09/11/22    Authorization Type BCBS Medicare    PT Start Time [phone]    PT Stop Time 1030    PT Time Calculation (min) 38 min    Activity Tolerance Patient tolerated treatment well    Behavior During Therapy [facility] for tasks assessed/performed                 Past Medical History:  Diagnosis Date   Atypical mole 08/14/2010   left shin tx mohs (atypical prolif.)   Atypical nevi 08/28/2010   left heel mild/mod. no tx   Cervical disc disease    Chronic pain syndrome    Constipation 04/21/2017   Diverticulitis    External hemorrhoids    GERD (gastroesophageal reflux disease)    Hemangioma    cervical; MRI 01/2019 Dr. Glenna Fellows   High cholesterol    Hypercholesteremia    IBS (irritable bowel syndrome)    Lumbar spondylosis with myelopathy    Lumbosacral pain, chronic    Numbness and tingling of right arm    Osteoporosis    Parotid tumor    Past Surgical History:  Procedure Laterality Date   APPENDECTOMY     CAROTID ENDARTERECTOMY     CHOLECYSTECTOMY     COLONOSCOPY  2011   Dr Laural Golden in [place]     Left 12/2016, 08/2017   Spinal fusion x 2     TONSILLECTOMY     TOTAL ABDOMINAL HYSTERECTOMY     Patient Active Problem List   Diagnosis Date Noted   Constipation 04/21/2017   Diverticulitis 03/02/2012   High cholesterol 03/02/2012    PCP: [facility]  REFERRING PROVIDER: Judith Part, MD  No apt scheduled  REFERRING DIAG: S22.009A closed fx of thoracic vertebra, unspecified fx morphology   Rationale for Evaluation and Treatment Rehabilitation  THERAPY DIAG:  Pain in thoracic spine  ONSET DATE: April 7 of 2023  SUBJECTIVE:                                                                                                                                                                                            SUBJECTIVE STATEMENT: Things are going well. Doing better. On her feet a lot yesterday.   Eval:Patient fell in April of 2023 resulting in a T12 compression fracture.  Reports chronic back pain prior to this with hx of lumbar fusion x2.  Symptoms worse with prolonged standing, had some radicular symptoms into BIL LES prior to fall but worse since, symptoms proximal to knee. Symptoms improve with sitting and lying / rest.   PERTINENT HISTORY:  Lumbar fusions x2, recent thoracic compression fx T12  PAIN:  Are you having pain? Yes: NPRS scale: 2/10 Pain location: back Pain description: Dull achy spams Aggravating factors: standing, walking, most upright activities Relieving factors: sitting and lying   PRECAUTIONS: None  WEIGHT BEARING RESTRICTIONS No  FALLS:  Has patient fallen in last 6 months? Yes. Number of falls 1  LIVING ENVIRONMENT: Lives with: lives with their family and lives with their spouse Lives in: House/apartment Stairs: Yes: External: 5 steps; can reach both Has following equipment at home: None  OCCUPATION: Retired - gardens, Doctor, general practice  PLOF: Independent, husband does cleaning and lifting  PATIENT GOALS Build strength, stability, reduce pain,    OBJECTIVE:   DIAGNOSTIC FINDINGS:  1. Subacute T12 vertebral body compression fracture with approximately 60% height loss and 4 mm of retropulsion of the superior posterior margin of the T12 vertebral body. 2. At L3-4 there is a broad-based disc bulge with a small central disc protrusion. Moderate bilateral facet arthropathy with bilateral facet effusions. Moderate-severe spinal stenosis. Mild bilateral foraminal stenosis. 3. Posterior lumbar interbody fusion and laminectomy at L4-5 without foraminal or central canal stenosis. 4. At L5-S1 there is a mild broad-based  disc bulge eccentric towards the left. Mild left foraminal stenosis.  PATIENT SURVEYS:  FOTO 47%  SCREENING FOR RED FLAGS: Bowel or bladder incontinence: No Spinal tumors: No Cauda equina syndrome: No Compression fracture: Yes: T12 (referring diagnosis Abdominal aneurysm: No  COGNITION:  Overall cognitive status: Within functional limits for tasks assessed     SENSATION: WFL   POSTURE: rounded shoulders and forward head  UE ROM WNL  LUMBAR ROM:   Active  A/PROM  eval  Flexion Limited 15%  Extension Limited 80% P!  Right lateral flexion Limited 15%  Left lateral flexion WNL  Right rotation   Left rotation    (Blank rows = not tested)   LOWER EXTREMITY MMT:    MMT (Tested seated) Right eval Left eval Right 08/16/22 Left  08/16/22  Hip flexion 4+ 4+    Hip extension 4 4    Hip abduction 5 5 Sidelying 4/5 Sidelying 4+/5  Hip adduction      Hip internal rotation      Hip external rotation      Knee flexion 4+ 4+    Knee extension 5 5    Ankle dorsiflexion 5 5    Ankle plantarflexion      Ankle inversion      Ankle eversion       (Blank rows = not tested)  UPPER EXTREMITY MMT:  MMT Right eval Left eval  Shoulder flexion 5 4  Shoulder extension    Shoulder abduction 5 4  Shoulder adduction    Shoulder internal rotation 5 4+  Shoulder external rotation 5 4  Middle trapezius    Lower trapezius    Elbow flexion 5 5  Elbow extension 5 5  Wrist flexion    Wrist extension    Wrist ulnar deviation    Wrist radial deviation    Wrist pronation    Wrist supination    Grip strength (lbs)       FUNCTIONAL TESTS:  5 times sit to stand:  16.1 sec    TODAY'S TREATMENT  08/29/22 Ab march  SLR with ab brace 2 x 10 each Bridge 2 x 10 LTR 10 x 5"  SKTC 10 x 5"  Clamshell BTB 2 x 10 each   Squats to front of mat 2  x10  Band rows GTB 2 x 10  Shoulder extension GTB 2 x 10  08/27/22 Supine: Bridge x 15 Bridge with march x 10  Abdominal bracing  with SLR x 10 each  Sidelying: Clam GTB 2 x 10 each  Standing: Squat front of mat 2 x 10 Hip vectors 3" hold x 5 each RTB shoulder rows 2 x 10 RTB shoulder extensions 2 x 10 Lumbar extensions over // bars x 10 Heel raises 2 x 10  08/21/22: Supine: SLR with ab set Bridge 15x 5" Sidelying: abduction Standing: squat front of mat  Lumbar extension March with ab set 15x 3" 1 hand hovering GTB shoulder 10x  Row 10x SLS Rt 20", Lt 16" max of 3 Forward then posterior lunges over foam on floor (progress to kneeling as able)   PATIENT EDUCATION:  Education details: HEP, POC, findings Person educated: Patient Education method: Explanation Education comprehension: verbalized understanding   HOME EXERCISE PROGRAM: Access Code: [email or web address] 08/29/22 - Shoulder extension with resistance - Neutral  - 2 x daily - 7 x weekly - 2 sets - 10 reps - Supine Lower Trunk Rotation  - 2 x daily - 7 x weekly - 1 sets - 10 reps - 5 second hold - Hooklying Single Knee to Chest Stretch  - 2 x daily - 7 x weekly - 1 sets - 10 reps - 5 second hold  08/21/22: SLS   08/16/22:  seated/standing rows with GTB  Date: 08/02/2022 Exercises - Supine Bridge with Mini Swiss Ball Between Knees  - 2 x daily - 7 x weekly - 1 sets - 10 reps - Bridge with Hip Abduction and Resistance  - 2 x daily - 7 x weekly - 1 sets - 10 reps  Access Code: [email or web address] URL: [email or web address] Date: 07/31/2022 Prepared by: Candie Mile  Exercises - Supine Transversus Abdominis Bracing - Hands on Stomach  - 2 x daily - 7 x weekly - 2 sets - 10 reps - 2-3 sec  hold - Supine March  - 2 x daily - 7 x weekly - 2 sets - 10 reps - Supine Bridge  - 2 x daily - 7 x weekly - 2 sets - 10 reps - Clamshell  - 2 x daily - 7 x weekly - 2 sets - 10 reps    ASSESSMENT:  CLINICAL IMPRESSION: Patient tolerated session well today. Progressed gentle lumbar mobility with supine stretching. Progressed resistance with standing  lumbar/ thoracic strengthening exercises. Patient did require min verbal cues for correcting form with table squats and keeping scapula down during band rows. Updated HEP and issued handout. Patient will continue to benefit from skilled therapy services to reduce remaining deficits and improve functional ability.    OBJECTIVE IMPAIRMENTS decreased activity tolerance, decreased knowledge of condition, decreased mobility, decreased ROM, decreased strength, increased muscle spasms, impaired flexibility, improper body mechanics, postural dysfunction, and pain.   ACTIVITY LIMITATIONS carrying, lifting, standing, and cleaning  PARTICIPATION LIMITATIONS: cleaning, community activity, and yard work  PERSONAL FACTORS Age, Fitness, and chronic back pain  are also affecting patient's functional outcome.   REHAB POTENTIAL: Good  CLINICAL DECISION MAKING: Stable/uncomplicated  EVALUATION COMPLEXITY: Low   GOALS: Goals reviewed with patient?  Yes  SHORT TERM GOALS: Target date: 08/21/2022  Patient will be independent with initial HEP and self-management strategies to improve functional outcomes Baseline: Initiated  Goal status: IN PROGRESS    LONG TERM GOALS: Target date: 09/11/2022  Patient will be independent with advanced HEP and self-management strategies to improve functional outcomes Baseline: Initiated Goal status: IN PROGRESS  2.  Patient will improve FOTO score to predicted value to indicate improvement in functional outcomes Baseline: 47% Goal status: IN PROGRESS  3.  Patient will report reduction of back pain to <4/10 with activity for improved quality of life and ability to perform ADLs  Baseline: 8/10 with activity Goal status: IN PROGRESS  4. Patient will have equal to or > 4+/5 MMT throughout Bil LE to improve ability to perform functional mobility, stair ambulation and ADLs.  Baseline: See above Goal status: IN PROGRESS    PLAN: PT FREQUENCY: 2x/week  PT DURATION: 6  weeks  PLANNED INTERVENTIONS: Therapeutic exercises, Therapeutic activity, Neuromuscular re-education, Balance training, Gait training, Patient/Family education, Joint manipulation, Joint mobilization, Stair training, Aquatic Therapy, Dry Needling, Electrical stimulation, Spinal manipulation, Spinal mobilization, Cryotherapy, Moist heat, scar mobilization, Taping, Traction, Ultrasound, Biofeedback, Ionotophoresis '4mg'$ /ml Dexamethasone, and Manual therapy.   PLAN FOR NEXT SESSION: Progressive core and LE strengthening. Also add balance activities as appropriate.  9:54 AM, 08/29/22 Josue Hector PT DPT  Physical Therapist with [facility]  [phone]

## 2022-09-02 ENCOUNTER — Ambulatory Visit (HOSPITAL_COMMUNITY): Payer: Medicare Other | Attending: Neurological Surgery | Admitting: Physical Therapy

## 2022-09-02 DIAGNOSIS — M546 Pain in thoracic spine: Secondary | ICD-10-CM | POA: Insufficient documentation

## 2022-09-02 NOTE — Therapy (Signed)
OUTPATIENT PHYSICAL THERAPY THORACOLUMBAR TREATMENT  Patient Name: Morgan Rowland MRN: [id number] DOB:12/25/1950, 71 y.o., female Today's Date: 09/02/2022   PT End of Session - 09/02/22 1035     Visit Number 7    Number of Visits 12    Date for PT Re-Evaluation 09/11/22    Authorization Type BCBS Medicare    PT Start Time 1033    PT Stop Time 1111    PT Time Calculation (min) 38 min    Activity Tolerance Patient tolerated treatment well    Behavior During Therapy [facility] for tasks assessed/performed                 Past Medical History:  Diagnosis Date   Atypical mole 08/14/2010   left shin tx mohs (atypical prolif.)   Atypical nevi 08/28/2010   left heel mild/mod. no tx   Cervical disc disease    Chronic pain syndrome    Constipation 04/21/2017   Diverticulitis    External hemorrhoids    GERD (gastroesophageal reflux disease)    Hemangioma    cervical; MRI 01/2019 Dr. Glenna Fellows   High cholesterol    Hypercholesteremia    IBS (irritable bowel syndrome)    Lumbar spondylosis with myelopathy    Lumbosacral pain, chronic    Numbness and tingling of right arm    Osteoporosis    Parotid tumor    Past Surgical History:  Procedure Laterality Date   APPENDECTOMY     CAROTID ENDARTERECTOMY     CHOLECYSTECTOMY     COLONOSCOPY  2011   Dr Laural Golden in [place]     Left 12/2016, 08/2017   Spinal fusion x 2     TONSILLECTOMY     TOTAL ABDOMINAL HYSTERECTOMY     Patient Active Problem List   Diagnosis Date Noted   Constipation 04/21/2017   Diverticulitis 03/02/2012   High cholesterol 03/02/2012    PCP: [facility]  REFERRING PROVIDER: Judith Part, MD  No apt scheduled  REFERRING DIAG: S22.009A closed fx of thoracic vertebra, unspecified fx morphology   Rationale for Evaluation and Treatment Rehabilitation  THERAPY DIAG:  Pain in thoracic spine  ONSET DATE: April 7 of 2023  SUBJECTIVE:                                                                                                                                                                                            SUBJECTIVE STATEMENT: Some stiffness in the morning. Feels th more she does the better she feels. Also noting increased energy levels. HEP going well.   Eval:Patient fell  in April of 2023 resulting in a T12 compression fracture. Reports chronic back pain prior to this with hx of lumbar fusion x2.  Symptoms worse with prolonged standing, had some radicular symptoms into BIL LES prior to fall but worse since, symptoms proximal to knee. Symptoms improve with sitting and lying / rest.   PERTINENT HISTORY:  Lumbar fusions x2, recent thoracic compression fx T12  PAIN:  Are you having pain? No    PRECAUTIONS: None  WEIGHT BEARING RESTRICTIONS No  FALLS:  Has patient fallen in last 6 months? Yes. Number of falls 1  LIVING ENVIRONMENT: Lives with: lives with their family and lives with their spouse Lives in: House/apartment Stairs: Yes: External: 5 steps; can reach both Has following equipment at home: None  OCCUPATION: Retired - gardens, Doctor, general practice  PLOF: Independent, husband does cleaning and lifting  PATIENT GOALS Build strength, stability, reduce pain,    OBJECTIVE:   DIAGNOSTIC FINDINGS:  1. Subacute T12 vertebral body compression fracture with approximately 60% height loss and 4 mm of retropulsion of the superior posterior margin of the T12 vertebral body. 2. At L3-4 there is a broad-based disc bulge with a small central disc protrusion. Moderate bilateral facet arthropathy with bilateral facet effusions. Moderate-severe spinal stenosis. Mild bilateral foraminal stenosis. 3. Posterior lumbar interbody fusion and laminectomy at L4-5 without foraminal or central canal stenosis. 4. At L5-S1 there is a mild broad-based disc bulge eccentric towards the left. Mild left foraminal stenosis.  PATIENT SURVEYS:  FOTO 47%  SCREENING  FOR RED FLAGS: Bowel or bladder incontinence: No Spinal tumors: No Cauda equina syndrome: No Compression fracture: Yes: T12 (referring diagnosis Abdominal aneurysm: No  COGNITION:  Overall cognitive status: Within functional limits for tasks assessed     SENSATION: WFL   POSTURE: rounded shoulders and forward head  UE ROM WNL  LUMBAR ROM:   Active  A/PROM  eval  Flexion Limited 15%  Extension Limited 80% P!  Right lateral flexion Limited 15%  Left lateral flexion WNL  Right rotation   Left rotation    (Blank rows = not tested)   LOWER EXTREMITY MMT:    MMT (Tested seated) Right eval Left eval Right 08/16/22 Left  08/16/22  Hip flexion 4+ 4+    Hip extension 4 4    Hip abduction 5 5 Sidelying 4/5 Sidelying 4+/5  Hip adduction      Hip internal rotation      Hip external rotation      Knee flexion 4+ 4+    Knee extension 5 5    Ankle dorsiflexion 5 5    Ankle plantarflexion      Ankle inversion      Ankle eversion       (Blank rows = not tested)  UPPER EXTREMITY MMT:  MMT Right eval Left eval  Shoulder flexion 5 4  Shoulder extension    Shoulder abduction 5 4  Shoulder adduction    Shoulder internal rotation 5 4+  Shoulder external rotation 5 4  Middle trapezius    Lower trapezius    Elbow flexion 5 5  Elbow extension 5 5  Wrist flexion    Wrist extension    Wrist ulnar deviation    Wrist radial deviation    Wrist pronation    Wrist supination    Grip strength (lbs)       FUNCTIONAL TESTS:  5 times sit to stand: 16.1 sec    TODAY'S TREATMENT  09/02/22 Ab march x20  Active HS stretch 10 x 5"  SLR with ab brace 2 x 10 each Bridge 2 x 10 LTR 10 x 5"  Deadbug 2 x 10   Sidelying clamshell GTB x20 each  Squats to front of mat (with red med ball holds) 2  x10  Band rows GTB 2 x 10  Shoulder extension GTB 2 x 10 Power up on 2 inch box x 10 each with 3 sec hold  Single leg stance with vector 3 x5" each     08/29/22 Ab march  SLR  with ab brace 2 x 10 each Bridge 2 x 10 LTR 10 x 5"  SKTC 10 x 5"  Clamshell BTB 2 x 10 each   Squats to front of mat 2  x10  Band rows GTB 2 x 10  Shoulder extension GTB 2 x 10  08/27/22 Supine: Bridge x 15 Bridge with march x 10  Abdominal bracing with SLR x 10 each  Sidelying: Clam GTB 2 x 10 each  Standing: Squat front of mat 2 x 10 Hip vectors 3" hold x 5 each RTB shoulder rows 2 x 10 RTB shoulder extensions 2 x 10 Lumbar extensions over // bars x 10 Heel raises 2 x 10   PATIENT EDUCATION:  Education details: HEP, POC, findings Person educated: Patient Education method: Explanation Education comprehension: verbalized understanding   HOME EXERCISE PROGRAM: Access Code: [email or web address]  09/02/22 - Hooklying Active Hamstring Stretch  - 2 x daily - 7 x weekly - 1 sets - 10 reps - 5 second hold - Dead Bug  - 2 x daily - 7 x weekly - 2 sets - 10 reps  08/29/22 - Shoulder extension with resistance - Neutral  - 2 x daily - 7 x weekly - 2 sets - 10 reps - Supine Lower Trunk Rotation  - 2 x daily - 7 x weekly - 1 sets - 10 reps - 5 second hold - Hooklying Single Knee to Chest Stretch  - 2 x daily - 7 x weekly - 1 sets - 10 reps - 5 second hold  08/21/22: SLS   08/16/22:  seated/standing rows with GTB  Date: 08/02/2022 Exercises - Supine Bridge with Mini Swiss Ball Between Knees  - 2 x daily - 7 x weekly - 1 sets - 10 reps - Bridge with Hip Abduction and Resistance  - 2 x daily - 7 x weekly - 1 sets - 10 reps  Access Code: [email or web address] URL: [email or web address] Date: 07/31/2022 Prepared by: Candie Mile  Exercises - Supine Transversus Abdominis Bracing - Hands on Stomach  - 2 x daily - 7 x weekly - 2 sets - 10 reps - 2-3 sec  hold - Supine March  - 2 x daily - 7 x weekly - 2 sets - 10 reps - Supine Bridge  - 2 x daily - 7 x weekly - 2 sets - 10 reps - Clamshell  - 2 x daily - 7 x weekly - 2 sets - 10 reps    ASSESSMENT:  CLINICAL IMPRESSION: Patient  tolerates strengthening well. Required verbal cues and demo for proper form with added deadbug for core strengthening. Most challenged with balance activity today. Noting some tension in RT hip with power up. Updated and issued HEP handout. Patient will continue to benefit from skilled therapy services to reduce remaining deficits and improve functional ability.    OBJECTIVE IMPAIRMENTS decreased activity tolerance, decreased knowledge of condition, decreased mobility, decreased ROM, decreased strength, increased muscle spasms,  impaired flexibility, improper body mechanics, postural dysfunction, and pain.   ACTIVITY LIMITATIONS carrying, lifting, standing, and cleaning  PARTICIPATION LIMITATIONS: cleaning, community activity, and yard work  PERSONAL FACTORS Age, Fitness, and chronic back pain  are also affecting patient's functional outcome.   REHAB POTENTIAL: Good  CLINICAL DECISION MAKING: Stable/uncomplicated  EVALUATION COMPLEXITY: Low   GOALS: Goals reviewed with patient? Yes  SHORT TERM GOALS: Target date: 08/21/2022  Patient will be independent with initial HEP and self-management strategies to improve functional outcomes Baseline: Initiated  Goal status: IN PROGRESS    LONG TERM GOALS: Target date: 09/11/2022  Patient will be independent with advanced HEP and self-management strategies to improve functional outcomes Baseline: Initiated Goal status: IN PROGRESS  2.  Patient will improve FOTO score to predicted value to indicate improvement in functional outcomes Baseline: 47% Goal status: IN PROGRESS  3.  Patient will report reduction of back pain to <4/10 with activity for improved quality of life and ability to perform ADLs  Baseline: 8/10 with activity Goal status: IN PROGRESS  4. Patient will have equal to or > 4+/5 MMT throughout Bil LE to improve ability to perform functional mobility, stair ambulation and ADLs.  Baseline: See above Goal status: IN  PROGRESS   PLAN: PT FREQUENCY: 2x/week  PT DURATION: 6 weeks  PLANNED INTERVENTIONS: Therapeutic exercises, Therapeutic activity, Neuromuscular re-education, Balance training, Gait training, Patient/Family education, Joint manipulation, Joint mobilization, Stair training, Aquatic Therapy, Dry Needling, Electrical stimulation, Spinal manipulation, Spinal mobilization, Cryotherapy, Moist heat, scar mobilization, Taping, Traction, Ultrasound, Biofeedback, Ionotophoresis '4mg'$ /ml Dexamethasone, and Manual therapy.   PLAN FOR NEXT SESSION: Progressive core and LE strengthening. Continue balance activities as appropriate.  10:35 AM, 09/02/22 Josue Hector PT DPT  Physical Therapist with [facility]  [phone]

## 2022-09-04 DIAGNOSIS — G4700 Insomnia, unspecified: Secondary | ICD-10-CM | POA: Diagnosis not present

## 2022-09-04 DIAGNOSIS — Z0001 Encounter for general adult medical examination with abnormal findings: Secondary | ICD-10-CM | POA: Diagnosis not present

## 2022-09-04 DIAGNOSIS — I10 Essential (primary) hypertension: Secondary | ICD-10-CM | POA: Diagnosis not present

## 2022-09-04 DIAGNOSIS — F419 Anxiety disorder, unspecified: Secondary | ICD-10-CM | POA: Diagnosis not present

## 2022-09-04 DIAGNOSIS — E7801 Familial hypercholesterolemia: Secondary | ICD-10-CM | POA: Diagnosis not present

## 2022-09-05 ENCOUNTER — Ambulatory Visit (HOSPITAL_COMMUNITY): Payer: Medicare Other | Admitting: Physical Therapy

## 2022-09-05 DIAGNOSIS — G894 Chronic pain syndrome: Secondary | ICD-10-CM | POA: Diagnosis not present

## 2022-09-05 DIAGNOSIS — M545 Low back pain, unspecified: Secondary | ICD-10-CM | POA: Diagnosis not present

## 2022-09-05 DIAGNOSIS — G8929 Other chronic pain: Secondary | ICD-10-CM | POA: Diagnosis not present

## 2022-09-05 DIAGNOSIS — M4716 Other spondylosis with myelopathy, lumbar region: Secondary | ICD-10-CM | POA: Diagnosis not present

## 2022-09-09 ENCOUNTER — Ambulatory Visit (HOSPITAL_COMMUNITY): Payer: Medicare Other | Admitting: Physical Therapy

## 2022-09-09 DIAGNOSIS — M546 Pain in thoracic spine: Secondary | ICD-10-CM | POA: Diagnosis not present

## 2022-09-09 NOTE — Therapy (Signed)
OUTPATIENT PHYSICAL THERAPY THORACOLUMBAR TREATMENT  Patient Name: Morgan Rowland MRN: [id number] DOB:July 02, 1951, 71 y.o., female Today's Date: 09/09/2022  PHYSICAL THERAPY DISCHARGE SUMMARY  Visits from Start of Care: 8  Current functional level related to goals / functional outcomes: See below   Remaining deficits: See below   Education / Equipment: See assessment   Patient agrees to discharge. Patient goals were partially met. Patient is being discharged due to being pleased with the current functional level.   PT End of Session - 09/09/22 1028     Visit Number 8    Number of Visits 12    Date for PT Re-Evaluation 09/11/22    Authorization Type BCBS Medicare    PT Start Time 1031    Activity Tolerance Patient tolerated treatment well    Behavior During Therapy [facility] for tasks assessed/performed                 Past Medical History:  Diagnosis Date   Atypical mole 08/14/2010   left shin tx mohs (atypical prolif.)   Atypical nevi 08/28/2010   left heel mild/mod. no tx   Cervical disc disease    Chronic pain syndrome    Constipation 04/21/2017   Diverticulitis    External hemorrhoids    GERD (gastroesophageal reflux disease)    Hemangioma    cervical; MRI 01/2019 Dr. Glenna Fellows   High cholesterol    Hypercholesteremia    IBS (irritable bowel syndrome)    Lumbar spondylosis with myelopathy    Lumbosacral pain, chronic    Numbness and tingling of right arm    Osteoporosis    Parotid tumor    Past Surgical History:  Procedure Laterality Date   APPENDECTOMY     CAROTID ENDARTERECTOMY     CHOLECYSTECTOMY     COLONOSCOPY  2011   Dr Laural Golden in [place]     Left 12/2016, 08/2017   Spinal fusion x 2     TONSILLECTOMY     TOTAL ABDOMINAL HYSTERECTOMY     Patient Active Problem List   Diagnosis Date Noted   Constipation 04/21/2017   Diverticulitis 03/02/2012   High cholesterol 03/02/2012    PCP: [facility]  REFERRING  PROVIDER: Judith Part, MD  No apt scheduled  REFERRING DIAG: S22.009A closed fx of thoracic vertebra, unspecified fx morphology   Rationale for Evaluation and Treatment Rehabilitation  THERAPY DIAG:  Pain in thoracic spine  ONSET DATE: April 7 of 2023  SUBJECTIVE:  SUBJECTIVE STATEMENT: Patient reports she is feeling stronger and feeling more confident. Feels she has had a very good therapy experience this time.   Eval:Patient fell in April of 2023 resulting in a T12 compression fracture. Reports chronic back pain prior to this with hx of lumbar fusion x2.  Symptoms worse with prolonged standing, had some radicular symptoms into BIL LES prior to fall but worse since, symptoms proximal to knee. Symptoms improve with sitting and lying / rest.   PERTINENT HISTORY:  Lumbar fusions x2, recent thoracic compression fx T12  PAIN:  Are you having pain? Yes: NPRS scale: 2/10 Pain location: back Pain description: Dull achy spams Aggravating factors: standing, walking, most upright activities Relieving factors: sitting and lying    PRECAUTIONS: None  WEIGHT BEARING RESTRICTIONS No  FALLS:  Has patient fallen in last 6 months? Yes. Number of falls 1  LIVING ENVIRONMENT: Lives with: lives with their family and lives with their spouse Lives in: House/apartment Stairs: Yes: External: 5 steps; can reach both Has following equipment at home: None  OCCUPATION: Retired - gardens, Doctor, general practice  PLOF: Independent, husband does cleaning and lifting  PATIENT GOALS Build strength, stability, reduce pain,    OBJECTIVE:   DIAGNOSTIC FINDINGS:  1. Subacute T12 vertebral body compression fracture with approximately 60% height loss and 4 mm of retropulsion of the superior posterior margin of the T12  vertebral body. 2. At L3-4 there is a broad-based disc bulge with a small central disc protrusion. Moderate bilateral facet arthropathy with bilateral facet effusions. Moderate-severe spinal stenosis. Mild bilateral foraminal stenosis. 3. Posterior lumbar interbody fusion and laminectomy at L4-5 without foraminal or central canal stenosis. 4. At L5-S1 there is a mild broad-based disc bulge eccentric towards the left. Mild left foraminal stenosis.  PATIENT SURVEYS:  FOTO 53% function (was 47% )  SCREENING FOR RED FLAGS: Bowel or bladder incontinence: No Spinal tumors: No Cauda equina syndrome: No Compression fracture: Yes: T12 (referring diagnosis Abdominal aneurysm: No  COGNITION:  Overall cognitive status: Within functional limits for tasks assessed     SENSATION: WFL   POSTURE: rounded shoulders and forward head  UE ROM WNL  LUMBAR ROM:   Active  A/PROM  eval AROM 09/09/22  Flexion Limited 15% Full  Extension Limited 80% P! 40% limited   Right lateral flexion Limited 15% Full  Left lateral flexion WNL Full  Right rotation    Left rotation     (Blank rows = not tested)   LOWER EXTREMITY MMT:    MMT (Tested seated) Right eval Left eval Right 08/16/22 Left  08/16/22 Right 09/09/22 Left 09/09/22  Hip flexion 4+ 4+   5 5  Hip extension 4 4   4+ 4+  Hip abduction 5 5 Sidelying 4/5 Sidelying 4+/5 5 5   Hip adduction        Hip internal rotation        Hip external rotation        Knee flexion 4+ 4+   5 5  Knee extension 5 5   5 5   Ankle dorsiflexion 5 5      Ankle plantarflexion        Ankle inversion        Ankle eversion         (Blank rows = not tested)  UPPER EXTREMITY MMT:  MMT Right eval Left eval Right 09/09/22 Left 09/09/22  Shoulder flexion 5 4 5 5   Shoulder extension      Shoulder abduction 5  4 5 5+  Shoulder adduction      Shoulder internal rotation 5 4+ 5 5  Shoulder external rotation 5 4 5 5   Middle trapezius      Lower trapezius       Elbow flexion 5 5    Elbow extension 5 5    Wrist flexion      Wrist extension      Wrist ulnar deviation      Wrist radial deviation      Wrist pronation      Wrist supination      Grip strength (lbs)         FUNCTIONAL TESTS:  5 times sit to stand: 16.1 sec  TODAY'S TREATMENT  09/09/22 Reassess AROM MMT FOTO HEP review  Chair squats x10 Deadbugs x 10  09/02/22 Ab march x20 Active HS stretch 10 x 5"  SLR with ab brace 2 x 10 each Bridge 2 x 10 LTR 10 x 5"  Deadbug 2 x 10   Sidelying clamshell GTB x20 each  Squats to front of mat (with red med ball holds) 2  x10  Band rows GTB 2 x 10  Shoulder extension GTB 2 x 10 Power up on 2 inch box x 10 each with 3 sec hold  Single leg stance with vector 3 x5" each    08/29/22 Ab march  SLR with ab brace 2 x 10 each Bridge 2 x 10 LTR 10 x 5"  SKTC 10 x 5"  Clamshell BTB 2 x 10 each   Squats to front of mat 2  x10  Band rows GTB 2 x 10  Shoulder extension GTB 2 x 10   PATIENT EDUCATION:  Education details: HEP, POC, findings Person educated: Patient Education method: Explanation Education comprehension: verbalized understanding   HOME EXERCISE PROGRAM: Access Code: [email or web address]  09/09/22 Access Code: [email or web address] URL: [email or web address] Date: 09/09/2022 Prepared by: Josue Hector  Exercises - Hooklying Active Hamstring Stretch  - 1-2 x daily - 7 x weekly - 1 sets - 10 reps - 5 second hold - Hooklying Single Knee to Chest Stretch  - 1-2 x daily - 7 x weekly - 1 sets - 10 reps - 5 second hold - Supine Lower Trunk Rotation  - 1-2 x daily - 7 x weekly - 1 sets - 10 reps - 5 second hold - Supine Transversus Abdominis Bracing - Hands on Stomach  - 1-2 x daily - 5 x weekly - 1 sets - 10 reps - 2-3 sec  hold - Supine March  - 1-2 x daily - 5 x weekly - 2 sets - 10 reps - Dead Bug  - 1-2 x daily - 5 x weekly - 2 sets - 10 reps - Supine Bridge  - 1-2 x daily - 5 x weekly - 2 sets - 10 reps - Supine  Bridge with Mini Swiss Ball Between Knees  - 1-2 x daily - 5 x weekly - 1 sets - 10 reps - Bridge with Hip Abduction and Resistance  - 1-2 x daily - 5 x weekly - 1 sets - 10 reps - Clamshell with Resistance  - 1-2 x daily - 5 x weekly - 2 sets - 10 reps - Shoulder extension with resistance - Neutral  - 1-2 x daily - 5 x weekly - 2 sets - 10 reps - Standing Row with Anchored Resistance  - 1-2 x daily - 2 x weekly - 2 sets - 10 reps - Squat with  Chair Touch  - 1-2 x daily - 5 x weekly - 2 sets - 10 reps - Single Leg Stance  - 1-2 x daily - 5 x weekly - 1 sets - 3 reps - 30" hold  09/02/22 - Hooklying Active Hamstring Stretch  - 2 x daily - 7 x weekly - 1 sets - 10 reps - 5 second hold - Dead Bug  - 2 x daily - 7 x weekly - 2 sets - 10 reps  08/29/22 - Shoulder extension with resistance - Neutral  - 2 x daily - 7 x weekly - 2 sets - 10 reps - Supine Lower Trunk Rotation  - 2 x daily - 7 x weekly - 1 sets - 10 reps - 5 second hold - Hooklying Single Knee to Chest Stretch  - 2 x daily - 7 x weekly - 1 sets - 10 reps - 5 second hold  08/21/22: SLS   08/16/22:  seated/standing rows with GTB  Date: 08/02/2022 Exercises - Supine Bridge with Mini Swiss Ball Between Knees  - 2 x daily - 7 x weekly - 1 sets - 10 reps - Bridge with Hip Abduction and Resistance  - 2 x daily - 7 x weekly - 1 sets - 10 reps  Access Code: [email or web address] URL: [email or web address] Date: 07/31/2022 Prepared by: Candie Mile  Exercises - Supine Transversus Abdominis Bracing - Hands on Stomach  - 2 x daily - 7 x weekly - 2 sets - 10 reps - 2-3 sec  hold - Supine March  - 2 x daily - 7 x weekly - 2 sets - 10 reps - Supine Bridge  - 2 x daily - 7 x weekly - 2 sets - 10 reps - Clamshell  - 2 x daily - 7 x weekly - 2 sets - 10 reps    ASSESSMENT:  CLINICAL IMPRESSION: Patient has made very good progress to therapy goals. All goals met except FOTO. Patient reports feeling confident to continue with HEP and is  ready for DC today. Performed reassess and reviewed comprehensive HEP. Answered all patient questions. Patient DC today per being pleased with current functional level and therapy goals MET/ partially met. Encouraged patient to follow up with therapy services with any further questions or concerns.    OBJECTIVE IMPAIRMENTS decreased activity tolerance, decreased knowledge of condition, decreased mobility, decreased ROM, decreased strength, increased muscle spasms, impaired flexibility, improper body mechanics, postural dysfunction, and pain.   ACTIVITY LIMITATIONS carrying, lifting, standing, and cleaning  PARTICIPATION LIMITATIONS: cleaning, community activity, and yard work  PERSONAL FACTORS Age, Fitness, and chronic back pain  are also affecting patient's functional outcome.   REHAB POTENTIAL: Good  CLINICAL DECISION MAKING: Stable/uncomplicated  EVALUATION COMPLEXITY: Low   GOALS: Goals reviewed with patient? Yes  SHORT TERM GOALS: Target date: 08/21/2022  Patient will be independent with initial HEP and self-management strategies to improve functional outcomes Baseline: Initiated  Goal status: MET   LONG TERM GOALS: Target date: 09/11/2022  Patient will be independent with advanced HEP and self-management strategies to improve functional outcomes Baseline: Initiated Goal status: MET  2.  Patient will improve FOTO score to predicted value to indicate improvement in functional outcomes Baseline: 53% Goal status: Not MET   3.  Patient will report reduction of back pain to <4/10 with activity for improved quality of life and ability to perform ADLs  Baseline: 3-4/10 with activity Goal status: Partially MET   4. Patient will have equal to or > 4+/5  MMT throughout Bil LE to improve ability to perform functional mobility, stair ambulation and ADLs.  Baseline: See MMT Goal status: MET   PLAN: PT FREQUENCY: 2x/week  PT DURATION: 6 weeks  PLANNED INTERVENTIONS:  Therapeutic exercises, Therapeutic activity, Neuromuscular re-education, Balance training, Gait training, Patient/Family education, Joint manipulation, Joint mobilization, Stair training, Aquatic Therapy, Dry Needling, Electrical stimulation, Spinal manipulation, Spinal mobilization, Cryotherapy, Moist heat, scar mobilization, Taping, Traction, Ultrasound, Biofeedback, Ionotophoresis 39m/ml Dexamethasone, and Manual therapy.   PLAN FOR NEXT SESSION: DC to HEP   10:28 AM, 09/09/22 CJosue HectorPT DPT  [facility] [phone]

## 2022-09-12 ENCOUNTER — Encounter (HOSPITAL_COMMUNITY): Payer: Medicare Other | Admitting: Physical Therapy

## 2022-12-04 DIAGNOSIS — Z79899 Other long term (current) drug therapy: Secondary | ICD-10-CM | POA: Diagnosis not present

## 2022-12-04 DIAGNOSIS — M4716 Other spondylosis with myelopathy, lumbar region: Secondary | ICD-10-CM | POA: Diagnosis not present

## 2022-12-04 DIAGNOSIS — M545 Low back pain, unspecified: Secondary | ICD-10-CM | POA: Diagnosis not present

## 2022-12-04 DIAGNOSIS — R030 Elevated blood-pressure reading, without diagnosis of hypertension: Secondary | ICD-10-CM | POA: Diagnosis not present

## 2022-12-04 DIAGNOSIS — L570 Actinic keratosis: Secondary | ICD-10-CM | POA: Diagnosis not present

## 2022-12-04 DIAGNOSIS — Z6830 Body mass index (BMI) 30.0-30.9, adult: Secondary | ICD-10-CM | POA: Diagnosis not present

## 2022-12-13 DIAGNOSIS — L821 Other seborrheic keratosis: Secondary | ICD-10-CM | POA: Diagnosis not present

## 2022-12-13 DIAGNOSIS — L814 Other melanin hyperpigmentation: Secondary | ICD-10-CM | POA: Diagnosis not present

## 2022-12-13 DIAGNOSIS — L539 Erythematous condition, unspecified: Secondary | ICD-10-CM | POA: Diagnosis not present

## 2022-12-13 DIAGNOSIS — Z1283 Encounter for screening for malignant neoplasm of skin: Secondary | ICD-10-CM | POA: Diagnosis not present

## 2022-12-25 ENCOUNTER — Ambulatory Visit: Payer: Medicare Other | Admitting: Physician Assistant

## 2023-01-03 DIAGNOSIS — Z6831 Body mass index (BMI) 31.0-31.9, adult: Secondary | ICD-10-CM | POA: Diagnosis not present

## 2023-01-03 DIAGNOSIS — G8929 Other chronic pain: Secondary | ICD-10-CM | POA: Diagnosis not present

## 2023-01-03 DIAGNOSIS — M4716 Other spondylosis with myelopathy, lumbar region: Secondary | ICD-10-CM | POA: Diagnosis not present

## 2023-01-03 DIAGNOSIS — Z79899 Other long term (current) drug therapy: Secondary | ICD-10-CM | POA: Diagnosis not present

## 2023-01-03 DIAGNOSIS — R030 Elevated blood-pressure reading, without diagnosis of hypertension: Secondary | ICD-10-CM | POA: Diagnosis not present

## 2023-01-09 DIAGNOSIS — K080 Exfoliation of teeth due to systemic causes: Secondary | ICD-10-CM | POA: Diagnosis not present

## 2023-01-31 DIAGNOSIS — Z79899 Other long term (current) drug therapy: Secondary | ICD-10-CM | POA: Diagnosis not present

## 2023-01-31 DIAGNOSIS — G894 Chronic pain syndrome: Secondary | ICD-10-CM | POA: Diagnosis not present

## 2023-01-31 DIAGNOSIS — Z6831 Body mass index (BMI) 31.0-31.9, adult: Secondary | ICD-10-CM | POA: Diagnosis not present

## 2023-01-31 DIAGNOSIS — M545 Low back pain, unspecified: Secondary | ICD-10-CM | POA: Diagnosis not present

## 2023-02-27 DIAGNOSIS — E785 Hyperlipidemia, unspecified: Secondary | ICD-10-CM | POA: Diagnosis not present

## 2023-02-27 DIAGNOSIS — E039 Hypothyroidism, unspecified: Secondary | ICD-10-CM | POA: Diagnosis not present

## 2023-02-27 DIAGNOSIS — E7849 Other hyperlipidemia: Secondary | ICD-10-CM | POA: Diagnosis not present

## 2023-02-27 DIAGNOSIS — Z0001 Encounter for general adult medical examination with abnormal findings: Secondary | ICD-10-CM | POA: Diagnosis not present

## 2023-02-27 DIAGNOSIS — E7801 Familial hypercholesterolemia: Secondary | ICD-10-CM | POA: Diagnosis not present

## 2023-02-27 DIAGNOSIS — R739 Hyperglycemia, unspecified: Secondary | ICD-10-CM | POA: Diagnosis not present

## 2023-02-27 DIAGNOSIS — E782 Mixed hyperlipidemia: Secondary | ICD-10-CM | POA: Diagnosis not present

## 2023-02-27 DIAGNOSIS — I10 Essential (primary) hypertension: Secondary | ICD-10-CM | POA: Diagnosis not present

## 2023-02-27 DIAGNOSIS — E7800 Pure hypercholesterolemia, unspecified: Secondary | ICD-10-CM | POA: Diagnosis not present

## 2023-03-03 DIAGNOSIS — G8929 Other chronic pain: Secondary | ICD-10-CM | POA: Diagnosis not present

## 2023-03-03 DIAGNOSIS — M4716 Other spondylosis with myelopathy, lumbar region: Secondary | ICD-10-CM | POA: Diagnosis not present

## 2023-03-03 DIAGNOSIS — R3129 Other microscopic hematuria: Secondary | ICD-10-CM | POA: Diagnosis not present

## 2023-03-03 DIAGNOSIS — G894 Chronic pain syndrome: Secondary | ICD-10-CM | POA: Diagnosis not present

## 2023-03-03 DIAGNOSIS — Z6831 Body mass index (BMI) 31.0-31.9, adult: Secondary | ICD-10-CM | POA: Diagnosis not present

## 2023-03-03 DIAGNOSIS — M545 Low back pain, unspecified: Secondary | ICD-10-CM | POA: Diagnosis not present

## 2023-03-03 DIAGNOSIS — R300 Dysuria: Secondary | ICD-10-CM | POA: Diagnosis not present

## 2023-03-06 DIAGNOSIS — G4700 Insomnia, unspecified: Secondary | ICD-10-CM | POA: Diagnosis not present

## 2023-03-06 DIAGNOSIS — F419 Anxiety disorder, unspecified: Secondary | ICD-10-CM | POA: Diagnosis not present

## 2023-03-06 DIAGNOSIS — I10 Essential (primary) hypertension: Secondary | ICD-10-CM | POA: Diagnosis not present

## 2023-03-06 DIAGNOSIS — E7801 Familial hypercholesterolemia: Secondary | ICD-10-CM | POA: Diagnosis not present

## 2023-03-26 DIAGNOSIS — K080 Exfoliation of teeth due to systemic causes: Secondary | ICD-10-CM | POA: Diagnosis not present

## 2023-03-27 DIAGNOSIS — Z1231 Encounter for screening mammogram for malignant neoplasm of breast: Secondary | ICD-10-CM | POA: Diagnosis not present

## 2023-03-31 DIAGNOSIS — K080 Exfoliation of teeth due to systemic causes: Secondary | ICD-10-CM | POA: Diagnosis not present

## 2023-04-02 DIAGNOSIS — Z79899 Other long term (current) drug therapy: Secondary | ICD-10-CM | POA: Diagnosis not present

## 2023-04-02 DIAGNOSIS — M545 Low back pain, unspecified: Secondary | ICD-10-CM | POA: Diagnosis not present

## 2023-04-02 DIAGNOSIS — M4716 Other spondylosis with myelopathy, lumbar region: Secondary | ICD-10-CM | POA: Diagnosis not present

## 2023-04-02 DIAGNOSIS — G894 Chronic pain syndrome: Secondary | ICD-10-CM | POA: Diagnosis not present

## 2023-04-02 DIAGNOSIS — Z6831 Body mass index (BMI) 31.0-31.9, adult: Secondary | ICD-10-CM | POA: Diagnosis not present

## 2023-04-23 DIAGNOSIS — R7989 Other specified abnormal findings of blood chemistry: Secondary | ICD-10-CM | POA: Diagnosis not present

## 2023-04-24 DIAGNOSIS — G894 Chronic pain syndrome: Secondary | ICD-10-CM | POA: Diagnosis not present

## 2023-04-24 DIAGNOSIS — Z79899 Other long term (current) drug therapy: Secondary | ICD-10-CM | POA: Diagnosis not present

## 2023-04-24 DIAGNOSIS — M545 Low back pain, unspecified: Secondary | ICD-10-CM | POA: Diagnosis not present

## 2023-04-24 DIAGNOSIS — R030 Elevated blood-pressure reading, without diagnosis of hypertension: Secondary | ICD-10-CM | POA: Diagnosis not present

## 2023-04-24 DIAGNOSIS — Z6831 Body mass index (BMI) 31.0-31.9, adult: Secondary | ICD-10-CM | POA: Diagnosis not present

## 2023-04-24 DIAGNOSIS — M4716 Other spondylosis with myelopathy, lumbar region: Secondary | ICD-10-CM | POA: Diagnosis not present

## 2023-05-30 DIAGNOSIS — M4716 Other spondylosis with myelopathy, lumbar region: Secondary | ICD-10-CM | POA: Diagnosis not present

## 2023-05-30 DIAGNOSIS — Z6830 Body mass index (BMI) 30.0-30.9, adult: Secondary | ICD-10-CM | POA: Diagnosis not present

## 2023-05-30 DIAGNOSIS — R030 Elevated blood-pressure reading, without diagnosis of hypertension: Secondary | ICD-10-CM | POA: Diagnosis not present

## 2023-05-30 DIAGNOSIS — Z79899 Other long term (current) drug therapy: Secondary | ICD-10-CM | POA: Diagnosis not present

## 2023-05-30 DIAGNOSIS — M545 Low back pain, unspecified: Secondary | ICD-10-CM | POA: Diagnosis not present

## 2023-06-30 DIAGNOSIS — G894 Chronic pain syndrome: Secondary | ICD-10-CM | POA: Diagnosis not present

## 2023-06-30 DIAGNOSIS — R030 Elevated blood-pressure reading, without diagnosis of hypertension: Secondary | ICD-10-CM | POA: Diagnosis not present

## 2023-06-30 DIAGNOSIS — Z6831 Body mass index (BMI) 31.0-31.9, adult: Secondary | ICD-10-CM | POA: Diagnosis not present

## 2023-06-30 DIAGNOSIS — Z79899 Other long term (current) drug therapy: Secondary | ICD-10-CM | POA: Diagnosis not present

## 2023-06-30 DIAGNOSIS — E6609 Other obesity due to excess calories: Secondary | ICD-10-CM | POA: Diagnosis not present

## 2023-06-30 DIAGNOSIS — C439 Malignant melanoma of skin, unspecified: Secondary | ICD-10-CM | POA: Diagnosis not present

## 2023-07-28 DIAGNOSIS — G8929 Other chronic pain: Secondary | ICD-10-CM | POA: Diagnosis not present

## 2023-07-28 DIAGNOSIS — M4716 Other spondylosis with myelopathy, lumbar region: Secondary | ICD-10-CM | POA: Diagnosis not present

## 2023-07-28 DIAGNOSIS — R030 Elevated blood-pressure reading, without diagnosis of hypertension: Secondary | ICD-10-CM | POA: Diagnosis not present

## 2023-07-28 DIAGNOSIS — Z6831 Body mass index (BMI) 31.0-31.9, adult: Secondary | ICD-10-CM | POA: Diagnosis not present

## 2023-07-28 DIAGNOSIS — E6609 Other obesity due to excess calories: Secondary | ICD-10-CM | POA: Diagnosis not present

## 2023-08-12 DIAGNOSIS — F419 Anxiety disorder, unspecified: Secondary | ICD-10-CM | POA: Diagnosis not present

## 2023-08-12 DIAGNOSIS — M8580 Other specified disorders of bone density and structure, unspecified site: Secondary | ICD-10-CM | POA: Diagnosis not present

## 2023-08-13 DIAGNOSIS — H04123 Dry eye syndrome of bilateral lacrimal glands: Secondary | ICD-10-CM | POA: Diagnosis not present

## 2023-08-13 DIAGNOSIS — H1045 Other chronic allergic conjunctivitis: Secondary | ICD-10-CM | POA: Diagnosis not present

## 2023-08-13 DIAGNOSIS — H2513 Age-related nuclear cataract, bilateral: Secondary | ICD-10-CM | POA: Diagnosis not present

## 2023-08-28 DIAGNOSIS — R030 Elevated blood-pressure reading, without diagnosis of hypertension: Secondary | ICD-10-CM | POA: Diagnosis not present

## 2023-08-28 DIAGNOSIS — G8929 Other chronic pain: Secondary | ICD-10-CM | POA: Diagnosis not present

## 2023-08-28 DIAGNOSIS — S22080A Wedge compression fracture of T11-T12 vertebra, initial encounter for closed fracture: Secondary | ICD-10-CM | POA: Diagnosis not present

## 2023-08-28 DIAGNOSIS — Z6831 Body mass index (BMI) 31.0-31.9, adult: Secondary | ICD-10-CM | POA: Diagnosis not present

## 2023-08-28 DIAGNOSIS — Z79899 Other long term (current) drug therapy: Secondary | ICD-10-CM | POA: Diagnosis not present

## 2023-08-28 DIAGNOSIS — M545 Low back pain, unspecified: Secondary | ICD-10-CM | POA: Diagnosis not present

## 2023-08-28 DIAGNOSIS — M4716 Other spondylosis with myelopathy, lumbar region: Secondary | ICD-10-CM | POA: Diagnosis not present

## 2023-08-28 DIAGNOSIS — R0602 Shortness of breath: Secondary | ICD-10-CM | POA: Diagnosis not present

## 2023-08-28 DIAGNOSIS — E559 Vitamin D deficiency, unspecified: Secondary | ICD-10-CM | POA: Diagnosis not present

## 2023-08-28 DIAGNOSIS — E6609 Other obesity due to excess calories: Secondary | ICD-10-CM | POA: Diagnosis not present

## 2023-08-28 DIAGNOSIS — Z23 Encounter for immunization: Secondary | ICD-10-CM | POA: Diagnosis not present

## 2023-09-01 DIAGNOSIS — Z79899 Other long term (current) drug therapy: Secondary | ICD-10-CM | POA: Diagnosis not present

## 2023-09-25 DIAGNOSIS — E039 Hypothyroidism, unspecified: Secondary | ICD-10-CM | POA: Diagnosis not present

## 2023-09-25 DIAGNOSIS — E7849 Other hyperlipidemia: Secondary | ICD-10-CM | POA: Diagnosis not present

## 2023-09-25 DIAGNOSIS — Z6831 Body mass index (BMI) 31.0-31.9, adult: Secondary | ICD-10-CM | POA: Diagnosis not present

## 2023-09-25 DIAGNOSIS — R7989 Other specified abnormal findings of blood chemistry: Secondary | ICD-10-CM | POA: Diagnosis not present

## 2023-09-25 DIAGNOSIS — R739 Hyperglycemia, unspecified: Secondary | ICD-10-CM | POA: Diagnosis not present

## 2023-09-25 DIAGNOSIS — E7801 Familial hypercholesterolemia: Secondary | ICD-10-CM | POA: Diagnosis not present

## 2023-09-25 DIAGNOSIS — M4716 Other spondylosis with myelopathy, lumbar region: Secondary | ICD-10-CM | POA: Diagnosis not present

## 2023-09-25 DIAGNOSIS — R030 Elevated blood-pressure reading, without diagnosis of hypertension: Secondary | ICD-10-CM | POA: Diagnosis not present

## 2023-09-25 DIAGNOSIS — I10 Essential (primary) hypertension: Secondary | ICD-10-CM | POA: Diagnosis not present

## 2023-09-25 DIAGNOSIS — G8929 Other chronic pain: Secondary | ICD-10-CM | POA: Diagnosis not present

## 2023-09-25 DIAGNOSIS — Z79899 Other long term (current) drug therapy: Secondary | ICD-10-CM | POA: Diagnosis not present

## 2023-09-25 DIAGNOSIS — E1165 Type 2 diabetes mellitus with hyperglycemia: Secondary | ICD-10-CM | POA: Diagnosis not present

## 2023-09-25 DIAGNOSIS — J449 Chronic obstructive pulmonary disease, unspecified: Secondary | ICD-10-CM | POA: Diagnosis not present

## 2023-09-25 DIAGNOSIS — E6609 Other obesity due to excess calories: Secondary | ICD-10-CM | POA: Diagnosis not present

## 2023-09-25 DIAGNOSIS — M545 Low back pain, unspecified: Secondary | ICD-10-CM | POA: Diagnosis not present

## 2023-10-01 DIAGNOSIS — Z23 Encounter for immunization: Secondary | ICD-10-CM | POA: Diagnosis not present

## 2023-10-01 DIAGNOSIS — E7801 Familial hypercholesterolemia: Secondary | ICD-10-CM | POA: Diagnosis not present

## 2023-10-01 DIAGNOSIS — I10 Essential (primary) hypertension: Secondary | ICD-10-CM | POA: Diagnosis not present

## 2023-10-01 DIAGNOSIS — K589 Irritable bowel syndrome without diarrhea: Secondary | ICD-10-CM | POA: Diagnosis not present

## 2023-10-01 DIAGNOSIS — Z1389 Encounter for screening for other disorder: Secondary | ICD-10-CM | POA: Diagnosis not present

## 2023-10-01 DIAGNOSIS — F419 Anxiety disorder, unspecified: Secondary | ICD-10-CM | POA: Diagnosis not present

## 2023-10-01 DIAGNOSIS — Z0001 Encounter for general adult medical examination with abnormal findings: Secondary | ICD-10-CM | POA: Diagnosis not present

## 2023-10-01 DIAGNOSIS — Z1331 Encounter for screening for depression: Secondary | ICD-10-CM | POA: Diagnosis not present

## 2023-10-15 DIAGNOSIS — Z23 Encounter for immunization: Secondary | ICD-10-CM | POA: Diagnosis not present

## 2023-10-27 DIAGNOSIS — Z79899 Other long term (current) drug therapy: Secondary | ICD-10-CM | POA: Diagnosis not present

## 2023-10-27 DIAGNOSIS — E6609 Other obesity due to excess calories: Secondary | ICD-10-CM | POA: Diagnosis not present

## 2023-10-27 DIAGNOSIS — G8929 Other chronic pain: Secondary | ICD-10-CM | POA: Diagnosis not present

## 2023-10-27 DIAGNOSIS — M4716 Other spondylosis with myelopathy, lumbar region: Secondary | ICD-10-CM | POA: Diagnosis not present

## 2023-10-27 DIAGNOSIS — M545 Low back pain, unspecified: Secondary | ICD-10-CM | POA: Diagnosis not present

## 2023-11-03 DIAGNOSIS — Z79899 Other long term (current) drug therapy: Secondary | ICD-10-CM | POA: Diagnosis not present

## 2023-11-05 DIAGNOSIS — M5416 Radiculopathy, lumbar region: Secondary | ICD-10-CM | POA: Diagnosis not present

## 2023-11-05 DIAGNOSIS — S22009A Unspecified fracture of unspecified thoracic vertebra, initial encounter for closed fracture: Secondary | ICD-10-CM | POA: Diagnosis not present

## 2023-11-21 ENCOUNTER — Other Ambulatory Visit: Payer: Self-pay

## 2023-11-21 ENCOUNTER — Ambulatory Visit (HOSPITAL_COMMUNITY): Payer: Medicare Other | Attending: Neurosurgery | Admitting: Physical Therapy

## 2023-11-21 DIAGNOSIS — R030 Elevated blood-pressure reading, without diagnosis of hypertension: Secondary | ICD-10-CM | POA: Diagnosis not present

## 2023-11-21 DIAGNOSIS — M545 Low back pain, unspecified: Secondary | ICD-10-CM | POA: Diagnosis not present

## 2023-11-21 DIAGNOSIS — M546 Pain in thoracic spine: Secondary | ICD-10-CM | POA: Insufficient documentation

## 2023-11-21 DIAGNOSIS — Z6832 Body mass index (BMI) 32.0-32.9, adult: Secondary | ICD-10-CM | POA: Diagnosis not present

## 2023-11-21 DIAGNOSIS — M5416 Radiculopathy, lumbar region: Secondary | ICD-10-CM | POA: Diagnosis not present

## 2023-11-21 DIAGNOSIS — M6281 Muscle weakness (generalized): Secondary | ICD-10-CM | POA: Insufficient documentation

## 2023-11-21 DIAGNOSIS — M4716 Other spondylosis with myelopathy, lumbar region: Secondary | ICD-10-CM | POA: Diagnosis not present

## 2023-11-21 DIAGNOSIS — E6609 Other obesity due to excess calories: Secondary | ICD-10-CM | POA: Diagnosis not present

## 2023-11-21 DIAGNOSIS — Z79899 Other long term (current) drug therapy: Secondary | ICD-10-CM | POA: Diagnosis not present

## 2023-11-21 DIAGNOSIS — G8929 Other chronic pain: Secondary | ICD-10-CM | POA: Diagnosis not present

## 2023-11-21 NOTE — Therapy (Signed)
OUTPATIENT PHYSICAL THERAPY THORACOLUMBAR EVALUATION   Patient Name: Morgan Rowland MRN: [id number] DOB:14-Mar-1951, 72 y.o., female Today's Date: 11/21/2023  END OF SESSION:  PT End of Session - 11/21/23 1341     Visit Number 1    Number of Visits 12    Date for PT Re-Evaluation 01/16/24   pt unable to come back for two weeks   Authorization Type [employer] medicare    Progress Note Due on Visit 10    PT Start Time [phone]   pt late   PT Stop Time 1020    PT Time Calculation (min) 44 min    Activity Tolerance Patient limited by pain    Behavior During Therapy [facility] for tasks assessed/performed             Past Medical History:  Diagnosis Date   Atypical mole 08/14/2010   left shin tx mohs (atypical prolif.)   Atypical nevi 08/28/2010   left heel mild/mod. no tx   Cervical disc disease    Chronic pain syndrome    Constipation 04/21/2017   Diverticulitis    External hemorrhoids    GERD (gastroesophageal reflux disease)    Hemangioma    cervical; MRI 01/2019 Dr. Trey Sailors   High cholesterol    Hypercholesteremia    IBS (irritable bowel syndrome)    Lumbar spondylosis with myelopathy    Lumbosacral pain, chronic    Numbness and tingling of right arm    Osteoporosis    Parotid tumor    Past Surgical History:  Procedure Laterality Date   APPENDECTOMY     CAROTID ENDARTERECTOMY     CHOLECYSTECTOMY     COLONOSCOPY  2011   Dr Karilyn Cota in [place]   KNEE SURGERY     Left 12/2016, 08/2017   Spinal fusion x 2     TONSILLECTOMY     TOTAL ABDOMINAL HYSTERECTOMY     Patient Active Problem List   Diagnosis Date Noted   Constipation 04/21/2017   Diverticulitis 03/02/2012   High cholesterol 03/02/2012    PCP: [facility]  REFERRING PROVIDER: Bedelia Person, MD  REFERRING DIAG: lumbar back pain  with radiculopathy affecting left lower extremity  Rationale for Evaluation and Treatment: Rehabilitation  THERAPY DIAG:  Lumbar radiculopathy Muscle weakness   ONSET  DATE: chronic   SUBJECTIVE:                                                                                                                                                                                           SUBJECTIVE STATEMENT: Pt states that she quit doing the exercises that she was  given last year.  She was doing well until the summer she noted that she had increased pain.  The pain really increased in October, she has had two bouts of steroids without relief and has not really been able to do much of anything.   Pt can sit for 30 minutes, Standing 10 minutes, walk for 15 minutes. Pt is not sleeping she wakes up in pain 3 x a night and then can not get back to sleep.  Pt states that she uses her walker to go to the bathroom at night as she is so unsteady,  During the day she does not use the walker.  Pt has an MRI scheduled.   PERTINENT HISTORY:  Patient fell in April of 2023 resulting in a T12 compression fracture. Reports chronic back pain prior to this with hx of lumbar fusion x2.  Symptoms worse with prolonged standing, had some radicular symptoms into BIL LES prior to fall but worse since, had PT which helped temporarily but now she is having more pain down her left leg.  PAIN:  Are you having pain? Yes: NPRS scale: 3/10; Worst pain 9/10, Best 0/10 Pain location: Back and leg pain;  Rt radiculopathy to mid calf, Lt  radiculopathy to right above ankle Pain description: grabbing  Aggravating factors: unknown Relieving factors: prop pillow under her legs   PRECAUTIONS: None  RED FLAGS: None   WEIGHT BEARING RESTRICTIONS: No  FALLS:  Has patient fallen in last 6 months? No  LIVING ENVIRONMENT: Lives with: lives with their spouse Lives in: House/apartment Stairs: Yes: External: 4 steps; on right going up can not go up reciprocally  Has following equipment at home: Walker - 2 wheeled  OCCUPATION: retired   PLOF: Independent  PATIENT GOALS: less pain to be able to do  more   NEXT MD VISIT: not scheduled until the MRI   OBJECTIVE:  Note: Objective measures were completed at Evaluation unless otherwise noted.  DIAGNOSTIC FINDINGS: IMPRESSION: 1. Subacute T12 vertebral body compression fracture with approximately 60% height loss and 4 mm of retropulsion of the superior posterior margin of the T12 vertebral body. 2. At L3-4 there is a broad-based disc bulge with a small central disc protrusion. Moderate bilateral facet arthropathy with bilateral facet effusions. Moderate-severe spinal stenosis. Mild bilateral foraminal stenosis. 3. Posterior lumbar interbody fusion and laminectomy at L4-5 without foraminal or central canal stenosis. 4. At L5-S1 there is a mild broad-based disc bulge eccentric towards the left. Mild left foraminal stenosis.     Electronically Signed   By: Elige Ko M.D.   On: 06/13/2022 09:48 A comperassin MRI is Scheduled Jan 16   PATIENT SURVEYS:  FOTO 37  COGNITION: Overall cognitive status: Within functional limits for tasks assessed    POSTURE: rounded shoulders, forward head, decreased lumbar lordosis, and increased thoracic kyphosis  PALPATION: Tight paravertebral mm  LUMBAR ROM:   AROM eval  Flexion   Extension 17  Right lateral flexion   Left lateral flexion   Right rotation   Left rotation    (Blank rows = not tested)   LOWER EXTREMITY MMT:    MMT Right eval Left eval  Hip flexion 4 4  Hip extension 2 2  Hip abduction 4+ 3  Hip adduction    Hip internal rotation    Hip external rotation    Knee flexion 3 3  Knee extension 4+ 5  Ankle dorsiflexion 3+ 4-  Ankle plantarflexion    Ankle inversion  Ankle eversion     (Blank rows = not tested)   FUNCTIONAL TESTS:  30 seconds chair stand test:  7 x Poor for age and sex  2 minute walk test: NT Single leg stance:NT  TREATMENT DATE:  11/21/23: eval Standing extension x 5 Sitting: Good posture x 10 Scapular retraction x 10 Ab set x 10                                                                                                                                 PATIENT EDUCATION:  Education details: posture, HEP Person educated: Patient Education method: Explanation and Handouts Education comprehension: verbalized understanding  HOME EXERCISE PROGRAM: Access Code: [email or web address] URL: [email or web address] Date: 11/21/2023 Prepared by: Virgina Organ  Exercises - Correct Seated Posture  - 3 x daily - 7 x weekly - 1 sets - 5 reps - 3-5" hold - Seated Scapular Retraction  - 2 x daily - 7 x weekly - 1 sets - 10 reps - 3-5" hold - Seated Transversus Abdominis Bracing  - 5 x daily - 7 x weekly - 1 sets - 10 reps - 5" hold - Standing Lumbar Extension  - 3 x daily - 7 x weekly - 1 sets - 5-10 reps - 3" hold  ASSESSMENT:  CLINICAL IMPRESSION: Patient is a 72 y.o. female who was seen today for physical therapy evaluation and treatment for lumbar radiculopathy.  Evaluation demonstrates decreased activity tolerance, decreased strength, increased pain and postural dysfunction.  Ms. Ramsburg will benefit from skilled PT to address these deficits and improve her functional ability.     OBJECTIVE IMPAIRMENTS: Abnormal gait, decreased activity tolerance, decreased mobility, difficulty walking, decreased ROM, decreased strength, and pain.   ACTIVITY LIMITATIONS: carrying, lifting, bending, sitting, standing, squatting, sleeping, stairs, transfers, bathing, dressing, and locomotion level  PARTICIPATION LIMITATIONS: meal prep, cleaning, laundry, driving, shopping, and community activity  PERSONAL FACTORS: Age, Fitness, Time since onset of injury/illness/exacerbation, and 3+ comorbidities: OA, osteoporosis, 3 back surgeries.   are also affecting patient's functional outcome.   REHAB POTENTIAL: Good  CLINICAL DECISION MAKING: Evolving/moderate complexity  EVALUATION COMPLEXITY: Moderate   GOALS: Goals reviewed with  patient? No  SHORT TERM GOALS: Target date: 12/19/23  PT to be I in HEP to decrease her back pain to no greater than a 6/10 to be waking only 2x a night Baseline: Goal status: INITIAL  2.  PT to be able to sit for 45 minutes without increased pain to eat a meal without increased  pain  Baseline:  Goal status: INITIAL  3.  Pt to be able to stand for 15 minutes in order to make a quick meal ie sandwich without increased pain  Baseline:  Goal status: INITIAL  4.  Pt to be able to walk for 30 minutes without increasing her pain for shopping tasks.  Baseline:  Goal status: INITIAL  5.  Radicular sx to be above knee level  Baseline:  Goal status: INITIAL    LONG TERM GOALS: Target date: 01/16/24  PT to be I in HEP to decrease her back pain to no greater than a 3/10 to be waking only 2x a night Baseline:  Goal status: INITIAL  2.  PT to be able to sit for 60 minutes without increased pain to eat a meal without increased  pain Baseline:  Goal status: INITIAL  3.   Pt to be able to stand for 30 minutes in order to make  meal  Baseline:  Goal status: INITIAL  4.  Pt to be able to walk for 45 minutes without increasing her pain for shopping tasks.  Baseline:  Goal status: INITIAL  5.  PT to increase her core and LE strength to be able to go up and down 4 steps in a reciprocal manner.  Baseline:  Goal status: INITIAL   PLAN:  PT FREQUENCY: 2x/week  PT DURATION: 6 weeks  PLANNED INTERVENTIONS: 97110-Therapeutic exercises, 97530- Therapeutic activity, [id number]- Neuromuscular re-education, 97535- Self Care, 82956- Manual therapy, and Patient/Family education.  PLAN FOR NEXT SESSION: completed single leg stance, 2 MWT, begin decompression and stabilization exercises.  Virgina Organ, PT CLT [phone]  11/21/2023, 2:56 PM  Valinda Hoar [facility] Authorization Request  Visit Dx Codes: M54.16; M62.81  Functional Tool Score: foto 37  For all possible CPT codes,  reference the Planned Interventions line above.     Check all conditions that are expected to impact treatment: {Conditions expected to impact treatment:Musculoskeletal disorders

## 2023-11-28 ENCOUNTER — Ambulatory Visit (HOSPITAL_COMMUNITY): Payer: Medicare Other

## 2023-11-28 ENCOUNTER — Encounter (HOSPITAL_COMMUNITY): Payer: Self-pay

## 2023-11-28 DIAGNOSIS — M6281 Muscle weakness (generalized): Secondary | ICD-10-CM

## 2023-11-28 DIAGNOSIS — M546 Pain in thoracic spine: Secondary | ICD-10-CM

## 2023-11-28 DIAGNOSIS — Z79899 Other long term (current) drug therapy: Secondary | ICD-10-CM | POA: Diagnosis not present

## 2023-11-28 DIAGNOSIS — M5416 Radiculopathy, lumbar region: Secondary | ICD-10-CM | POA: Diagnosis not present

## 2023-11-28 NOTE — Therapy (Signed)
OUTPATIENT PHYSICAL THERAPY THORACOLUMBAR TREATMENT   Patient Name: Morgan Rowland MRN: [id number] DOB:[birth year], 72 y.o., female Today's Date: 11/28/2023  END OF SESSION:  PT End of Session - 11/28/23 1150     Visit Number 2    Number of Visits 12    Date for PT Re-Evaluation 01/16/24    Authorization Type [employer] medicare    Progress Note Due on Visit 10    PT Start Time 1148    PT Stop Time 1227    PT Time Calculation (min) 39 min    Activity Tolerance Patient tolerated treatment well;Patient limited by pain    Behavior During Therapy [facility] for tasks assessed/performed             Past Medical History:  Diagnosis Date   Atypical mole 08/14/2010   left shin tx mohs (atypical prolif.)   Atypical nevi 08/28/2010   left heel mild/mod. no tx   Cervical disc disease    Chronic pain syndrome    Constipation 04/21/2017   Diverticulitis    External hemorrhoids    GERD (gastroesophageal reflux disease)    Hemangioma    cervical; MRI 01/2019 Dr. Trey Sailors   High cholesterol    Hypercholesteremia    IBS (irritable bowel syndrome)    Lumbar spondylosis with myelopathy    Lumbosacral pain, chronic    Numbness and tingling of right arm    Osteoporosis    Parotid tumor    Past Surgical History:  Procedure Laterality Date   APPENDECTOMY     CAROTID ENDARTERECTOMY     CHOLECYSTECTOMY     COLONOSCOPY  2011   Dr Karilyn Cota in [place]   KNEE SURGERY     Left 12/2016, 08/2017   Spinal fusion x 2     TONSILLECTOMY     TOTAL ABDOMINAL HYSTERECTOMY     Patient Active Problem List   Diagnosis Date Noted   Constipation 04/21/2017   Diverticulitis 03/02/2012   High cholesterol 03/02/2012    PCP: [facility]  REFERRING PROVIDER: Bedelia Person, MD  REFERRING DIAG: lumbar back pain  with radiculopathy affecting left lower extremity  Rationale for Evaluation and Treatment: Rehabilitation  THERAPY DIAG:  Lumbar radiculopathy Muscle weakness   ONSET DATE: chronic    SUBJECTIVE:                                                                                                                                                                                           SUBJECTIVE STATEMENT: 11/28/23:  Pt reports BLE Lt>Rt soreness today.  Has began HEP without questions.  No reports of pain currently.  Feels her Lt LE is contributes to back pain due to weakness.  Reports Lt LE pain woke her up at 4 this morning and took 2 hours to get back to sleep, required pain medication.  Eval:  Pt states that she quit doing the exercises that she was given last year.  She was doing well until the summer she noted that she had increased pain.  The pain really increased in October, she has had two bouts of steroids without relief and has not really been able to do much of anything.   Pt can sit for 30 minutes, Standing 10 minutes, walk for 15 minutes. Pt is not sleeping she wakes up in pain 3 x a night and then can not get back to sleep.  Pt states that she uses her walker to go to the bathroom at night as she is so unsteady,  During the day she does not use the walker.  Pt has an MRI scheduled.   PERTINENT HISTORY:  Patient fell in April of 2023 resulting in a T12 compression fracture. Reports chronic back pain prior to this with hx of lumbar fusion x2.  Symptoms worse with prolonged standing, had some radicular symptoms into BIL LES prior to fall but worse since, had PT which helped temporarily but now she is having more pain down her left leg.  PAIN:  Are you having pain? Yes: NPRS scale: 3/10; Worst pain 9/10, Best 0/10 Pain location: Back and leg pain;  Rt radiculopathy to mid calf, Lt  radiculopathy to right above ankle Pain description: grabbing  Aggravating factors: unknown Relieving factors: prop pillow under her legs   PRECAUTIONS: None  RED FLAGS: None   WEIGHT BEARING RESTRICTIONS: No  FALLS:  Has patient fallen in last 6 months? No  LIVING  ENVIRONMENT: Lives with: lives with their spouse Lives in: House/apartment Stairs: Yes: External: 4 steps; on right going up can not go up reciprocally  Has following equipment at home: Walker - 2 wheeled  OCCUPATION: retired   PLOF: Independent  PATIENT GOALS: less pain to be able to do more   NEXT MD VISIT: not scheduled until the MRI   OBJECTIVE:  Note: Objective measures were completed at Evaluation unless otherwise noted.  DIAGNOSTIC FINDINGS: IMPRESSION: 1. Subacute T12 vertebral body compression fracture with approximately 60% height loss and 4 mm of retropulsion of the superior posterior margin of the T12 vertebral body. 2. At L3-4 there is a broad-based disc bulge with a small central disc protrusion. Moderate bilateral facet arthropathy with bilateral facet effusions. Moderate-severe spinal stenosis. Mild bilateral foraminal stenosis. 3. Posterior lumbar interbody fusion and laminectomy at L4-5 without foraminal or central canal stenosis. 4. At L5-S1 there is a mild broad-based disc bulge eccentric towards the left. Mild left foraminal stenosis.     Electronically Signed   By: Elige Ko M.D.   On: 06/13/2022 09:48 A comperassin MRI is Scheduled Jan 16   PATIENT SURVEYS:  FOTO 37  COGNITION: Overall cognitive status: Within functional limits for tasks assessed    POSTURE: rounded shoulders, forward head, decreased lumbar lordosis, and increased thoracic kyphosis  PALPATION: Tight paravertebral mm  LUMBAR ROM:   AROM eval  Flexion   Extension 17  Right lateral flexion   Left lateral flexion   Right rotation   Left rotation    (Blank rows = not tested)   LOWER EXTREMITY MMT:    MMT Right eval Left eval  Hip flexion 4 4  Hip  extension 2 2  Hip abduction 4+ 3  Hip adduction    Hip internal rotation    Hip external rotation    Knee flexion 3 3  Knee extension 4+ 5  Ankle dorsiflexion 3+ 4-  Ankle plantarflexion    Ankle inversion     Ankle eversion     (Blank rows = not tested)   FUNCTIONAL TESTS:  30 seconds chair stand test:  7 x Poor for age and sex  2 minute walk test: 11/28/23:  437ft no AD Single leg stance: 11/28/23:  Rt 25" max, Lt 29" max of 3  TREATMENT DATE:  11/28/23: Reviewed goals Educated benefits with HEP  458ft no AD Single leg stance: 11/28/23:  Rt 25" max, Lt 29" max of 3  Supine: Log rolling Decompression exercises 2-5 5x 5" holds Deep breathing with 1 hand on chest/1 on stomach x 1' TrA activation paired with exhale x 1' Glut sets 10x 5" Bridges 3x limited by pain and partial range due to pain   11/21/23: eval Standing extension x 5 Sitting: Good posture x 10 Scapular retraction x 10 Ab set x 10                                                                                                                                PATIENT EDUCATION:  Education details: posture, HEP Person educated: Patient Education method: Explanation and Handouts Education comprehension: verbalized understanding  HOME EXERCISE PROGRAM: Access Code: [email or web address] URL: [email or web address] Date: 11/21/2023 Prepared by: Virgina Organ  Exercises - Correct Seated Posture  - 3 x daily - 7 x weekly - 1 sets - 5 reps - 3-5" hold - Seated Scapular Retraction  - 2 x daily - 7 x weekly - 1 sets - 10 reps - 3-5" hold - Seated Transversus Abdominis Bracing  - 5 x daily - 7 x weekly - 1 sets - 10 reps - 5" hold - Standing Lumbar Extension  - 3 x daily - 7 x weekly - 1 sets - 5-10 reps - 3" hold  11/28/23: Access Code: [email or web address] URL: [email or web address] Date: 11/28/2023 Prepared by: Becky Sax  Exercises - Supine Transversus Abdominis Bracing - Hands on Stomach  - 2 x daily - 7 x weekly - 2 sets - 10 reps - 5" hold - Hooklying Gluteal Sets  - 2 x daily - 7 x weekly - 2 sets - 10 reps - 5" hold - Supine Bridge  - 2 x daily - 7 x weekly - 2 sets - 10 reps - 5"  hold  ASSESSMENT:  CLINICAL IMPRESSION: 11/28/23:  Reviewed goals and educated importance of HEP compliance for maximal benefits with therapy.  Objective testing complete including and SLS (see above).  Session foucs with core and proximal strengthening as well as postural strengthening.  Decompression exercises complete with good tolerance and good mechanics, pt presents with extreme weakness with Lt LE compared to Rt.  Given additional exercises to add to HEP with verbalized understanding.  Pt plans to go to [place] on Monday for 2 weeks, encouraged to stand and walk while on long flights and to continues exercises on vacation.  Eval: Patient is a 72 y.o. female who was seen today for physical therapy evaluation and treatment for lumbar radiculopathy.  Evaluation demonstrates decreased activity tolerance, decreased strength, increased pain and postural dysfunction.  Ms. [place] will benefit from skilled PT to address these deficits and improve her functional ability.     OBJECTIVE IMPAIRMENTS: Abnormal gait, decreased activity tolerance, decreased mobility, difficulty walking, decreased ROM, decreased strength, and pain.   ACTIVITY LIMITATIONS: carrying, lifting, bending, sitting, standing, squatting, sleeping, stairs, transfers, bathing, dressing, and locomotion level  PARTICIPATION LIMITATIONS: meal prep, cleaning, laundry, driving, shopping, and community activity  PERSONAL FACTORS: Age, Fitness, Time since onset of injury/illness/exacerbation, and 3+ comorbidities: OA, osteoporosis, 3 back surgeries.   are also affecting patient's functional outcome.   REHAB POTENTIAL: Good  CLINICAL DECISION MAKING: Evolving/moderate complexity  EVALUATION COMPLEXITY: Moderate   GOALS: Goals reviewed with patient? No  SHORT TERM GOALS: Target date: 12/19/23  PT to be I in HEP to decrease her back pain to no greater than a 6/10 to be waking only 2x a night Baseline: Goal status:  INITIAL  2.  PT to be able to sit for 45 minutes without increased pain to eat a meal without increased  pain  Baseline:  Goal status: INITIAL  3.  Pt to be able to stand for 15 minutes in order to make a quick meal ie sandwich without increased pain  Baseline:  Goal status: INITIAL  4.  Pt to be able to walk for 30 minutes without increasing her pain for shopping tasks.  Baseline:  Goal status: INITIAL  5.  Radicular sx to be above knee level  Baseline:  Goal status: INITIAL    LONG TERM GOALS: Target date: 01/16/24  PT to be I in HEP to decrease her back pain to no greater than a 3/10 to be waking only 2x a night Baseline:  Goal status: INITIAL  2.  PT to be able to sit for 60 minutes without increased pain to eat a meal without increased  pain Baseline:  Goal status: INITIAL  3.   Pt to be able to stand for 30 minutes in order to make  meal  Baseline:  Goal status: INITIAL  4.  Pt to be able to walk for 45 minutes without increasing her pain for shopping tasks.  Baseline:  Goal status: INITIAL  5.  PT to increase her core and LE strength to be able to go up and down 4 steps in a reciprocal manner.  Baseline:  Goal status: INITIAL   PLAN:  PT FREQUENCY: 2x/week  PT DURATION: 6 weeks  PLANNED INTERVENTIONS: 97110-Therapeutic exercises, 97530- Therapeutic activity, [id number]- Neuromuscular re-education, 97535- Self Care, 16109- Manual therapy, and Patient/Family education.  PLAN FOR NEXT SESSION: f/u compliance with HEP; add theraband with decompression and stabilization exercises.   Becky Sax, LPTA/CLT; CBIS [phone]  Juel Burrow, PTA 11/28/2023, 1:13 PM  11/28/2023, 1:04 PM

## 2023-12-17 ENCOUNTER — Ambulatory Visit (HOSPITAL_COMMUNITY): Payer: Medicare Other | Attending: Neurosurgery | Admitting: Physical Therapy

## 2023-12-17 DIAGNOSIS — M6281 Muscle weakness (generalized): Secondary | ICD-10-CM | POA: Diagnosis not present

## 2023-12-17 DIAGNOSIS — M546 Pain in thoracic spine: Secondary | ICD-10-CM | POA: Insufficient documentation

## 2023-12-17 DIAGNOSIS — M5416 Radiculopathy, lumbar region: Secondary | ICD-10-CM | POA: Diagnosis not present

## 2023-12-17 DIAGNOSIS — K080 Exfoliation of teeth due to systemic causes: Secondary | ICD-10-CM | POA: Diagnosis not present

## 2023-12-17 NOTE — Therapy (Signed)
 OUTPATIENT PHYSICAL THERAPY THORACOLUMBAR TREATMENT   Patient Name: Morgan Rowland MRN: [id number] DOB:05/21/1951, 73 y.o., female Today's Date: 12/17/2023  END OF SESSION:  PT End of Session - 12/17/23 1342     Visit Number 3    Number of Visits 12    Date for PT Re-Evaluation 01/16/24    Authorization Type BCBS medicare    Progress Note Due on Visit 10    PT Start Time 1302    PT Stop Time 1342    PT Time Calculation (min) 40 min    Activity Tolerance Patient tolerated treatment well;Patient limited by pain    Behavior During Therapy [facility] for tasks assessed/performed              Past Medical History:  Diagnosis Date   Atypical mole 08/14/2010   left shin tx mohs (atypical prolif.)   Atypical nevi 08/28/2010   left heel mild/mod. no tx   Cervical disc disease    Chronic pain syndrome    Constipation 04/21/2017   Diverticulitis    External hemorrhoids    GERD (gastroesophageal reflux disease)    Hemangioma    cervical; MRI 01/2019 Dr. Tawana Fast   High cholesterol    Hypercholesteremia    IBS (irritable bowel syndrome)    Lumbar spondylosis with myelopathy    Lumbosacral pain, chronic    Numbness and tingling of right arm    Osteoporosis    Parotid tumor    Past Surgical History:  Procedure Laterality Date   APPENDECTOMY     CAROTID ENDARTERECTOMY     CHOLECYSTECTOMY     COLONOSCOPY  2011   Dr Homero Luster in [place]   KNEE SURGERY     Left 12/2016, 08/2017   Spinal fusion x 2     TONSILLECTOMY     TOTAL ABDOMINAL HYSTERECTOMY     Patient Active Problem List   Diagnosis Date Noted   Constipation 04/21/2017   Diverticulitis 03/02/2012   High cholesterol 03/02/2012    PCP: [facility]  REFERRING PROVIDER: Van Gelinas, MD  REFERRING DIAG: lumbar back pain  with radiculopathy affecting left lower extremity  Rationale for Evaluation and Treatment: Rehabilitation  THERAPY DIAG:  Lumbar radiculopathy Muscle weakness   ONSET DATE: chronic    SUBJECTIVE:                                                                                                                                                                                           SUBJECTIVE STATEMENT: Pt is compliant with HEP.  PT states that she is having an MRI tomorrow.  The pain is  worst at night.   Pt is back from her trip to [place].  PERTINENT HISTORY:  Patient fell in April of 2023 resulting in a T12 compression fracture. Reports chronic back pain prior to this with hx of lumbar fusion x2.  Symptoms worse with prolonged standing, had some radicular symptoms into BIL LES prior to fall but worse since, had PT which helped temporarily but now she is having more pain down her left leg.  PAIN:  Are you having pain? Yes: NPRS scale: 3/10; Worst pain 9/10, Best 0/10 Pain location: Back and leg pain;  Rt radiculopathy to mid calf, Lt  radiculopathy to right above ankle Pain description: grabbing  Aggravating factors: unknown Relieving factors: prop pillow under her legs   PRECAUTIONS: None  RED FLAGS: None   WEIGHT BEARING RESTRICTIONS: No  FALLS:  Has patient fallen in last 6 months? No  LIVING ENVIRONMENT: Lives with: lives with their spouse Lives in: House/apartment Stairs: Yes: External: 4 steps; on right going up can not go up reciprocally  Has following equipment at home: Walker - 2 wheeled  OCCUPATION: retired   PLOF: Independent  PATIENT GOALS: less pain to be able to do more   NEXT MD VISIT: not scheduled until the MRI   OBJECTIVE:  Note: Objective measures were completed at Evaluation unless otherwise noted.  DIAGNOSTIC FINDINGS: IMPRESSION: 1. Subacute T12 vertebral body compression fracture with approximately 60% height loss and 4 mm of retropulsion of the superior posterior margin of the T12 vertebral body. 2. At L3-4 there is a broad-based disc bulge with a small central disc protrusion. Moderate bilateral facet arthropathy with  bilateral facet effusions. Moderate-severe spinal stenosis. Mild bilateral foraminal stenosis. 3. Posterior lumbar interbody fusion and laminectomy at L4-5 without foraminal or central canal stenosis. 4. At L5-S1 there is a mild broad-based disc bulge eccentric towards the left. Mild left foraminal stenosis.     Electronically Signed   By: Onnie Bilis M.D.   On: 06/13/2022 09:48 A comperassin MRI is Scheduled Jan 16   PATIENT SURVEYS:  FOTO 37  COGNITION: Overall cognitive status: Within functional limits for tasks assessed    POSTURE: rounded shoulders, forward head, decreased lumbar lordosis, and increased thoracic kyphosis  PALPATION: Tight paravertebral mm  LUMBAR ROM:   AROM eval  Flexion   Extension 17  Right lateral flexion   Left lateral flexion   Right rotation   Left rotation    (Blank rows = not tested)   LOWER EXTREMITY MMT:    MMT Right eval Left eval  Hip flexion 4 4  Hip extension 2 2  Hip abduction 4+ 3  Hip adduction    Hip internal rotation    Hip external rotation    Knee flexion 3 3  Knee extension 4+ 5  Ankle dorsiflexion 3+ 4-  Ankle plantarflexion    Ankle inversion    Ankle eversion     (Blank rows = not tested)   FUNCTIONAL TESTS:  30 seconds chair stand test:  7 x Poor for age and sex  2 minute walk test: 11/28/23:  439ft no AD Single leg stance: 11/28/23:  Rt 25" max, Lt 29" max of 3  TREATMENT DATE:  12/17/23 Wall arch x 10 Sitting  Sit to stand at higher level  x 10 W-back Thoracic excursions  Supine:   Knee to chest x 5 B  Bent knee raise x 10  Bridge x 10  Thera band decompression exercises all 4 (pull  over, chest, sash, ER)  x 5 each  Side lying: Hip abduction with abdominal contraction x 5 each   11/28/23: Reviewed goals Educated benefits with HEP  464ft no AD Single leg stance: 11/28/23:  Rt 25" max, Lt 29" max of 3  Supine: Log rolling Decompression exercises 2-5 5x 5" holds Deep breathing  with 1 hand on chest/1 on stomach x 1' TrA activation paired with exhale x 1' Glut sets 10x 5" Bridges 3x limited by pain and partial range due to pain   11/21/23: eval Standing extension x 5 Sitting: Good posture x 10 Scapular retraction x 10 Ab set x 10                                                                                                                                PATIENT EDUCATION:  Education details: posture, HEP Person educated: Patient Education method: Explanation and Handouts Education comprehension: verbalized understanding  HOME EXERCISE PROGRAM:   Access Code: [email or web address] URL: [email or web address] Date: 11/21/2023 Prepared by: Leodis Rainwater  Exercises - Correct Seated Posture  - 3 x daily - 7 x weekly - 1 sets - 5 reps - 3-5" hold - Seated Scapular Retraction  - 2 x daily - 7 x weekly - 1 sets - 10 reps - 3-5" hold - Seated Transversus Abdominis Bracing  - 5 x daily - 7 x weekly - 1 sets - 10 reps - 5" hold - Standing Lumbar Extension  - 3 x daily - 7 x weekly - 1 sets - 5-10 reps - 3" hold  11/28/23: Access Code: [email or web address] URL: [email or web address] Date: 11/28/2023 Prepared by: Minor Amble  Exercises - Supine Transversus Abdominis Bracing - Hands on Stomach  - 2 x daily - 7 x weekly - 2 sets - 10 reps - 5" hold - Hooklying Gluteal Sets  - 2 x daily - 7 x weekly - 2 sets - 10 reps - 5" hold - Supine Bridge  - 2 x daily - 7 x weekly - 2 sets - 10 reps - 5" hold 12/17/23 Decompression with yellow t-band  ASSESSMENT:  CLINICAL IMPRESSION: PT progressed with decompression exercises and stretching.  PT educated pt on proper sit to stand for improved function.   Given additional exercises to add to HEP with verbalized understanding.  PT continues to have decreased ROM, decreased strength, and decreased activity tolerance and will continue to benefit from skilled PT>  Eval: Patient is a 73 y.o. female who was seen today  for physical therapy evaluation and treatment for lumbar radiculopathy.  Evaluation demonstrates decreased activity tolerance, decreased strength, increased pain and postural dysfunction.  Ms. Hartlaub will benefit from skilled PT to address these deficits and improve her functional ability.     OBJECTIVE IMPAIRMENTS: Abnormal gait, decreased activity tolerance, decreased mobility, difficulty walking, decreased ROM, decreased strength, and pain.   ACTIVITY LIMITATIONS: carrying, lifting, bending, sitting, standing, squatting, sleeping, stairs, transfers, bathing, dressing,  and locomotion level  PARTICIPATION LIMITATIONS: meal prep, cleaning, laundry, driving, shopping, and community activity  PERSONAL FACTORS: Age, Fitness, Time since onset of injury/illness/exacerbation, and 3+ comorbidities: OA, osteoporosis, 3 back surgeries.   are also affecting patient's functional outcome.   REHAB POTENTIAL: Good  CLINICAL DECISION MAKING: Evolving/moderate complexity  EVALUATION COMPLEXITY: Moderate   GOALS: Goals reviewed with patient? No  SHORT TERM GOALS: Target date: 12/19/23  PT to be I in HEP to decrease her back pain to no greater than a 6/10 to be waking only 2x a night Baseline: Goal status: on-going   2.  PT to be able to sit for 45 minutes without increased pain to eat a meal without increased  pain  Baseline:  Goal status: on-going   3.  Pt to be able to stand for 15 minutes in order to make a quick meal ie sandwich without increased pain  Baseline:  Goal status: on-going   4.  Pt to be able to walk for 30 minutes without increasing her pain for shopping tasks.  Baseline:  Goal status: on-going   5.  Radicular sx to be above knee level  Baseline:  Goal status: on-going     LONG TERM GOALS: Target date: 01/16/24  PT to be I in HEP to decrease her back pain to no greater than a 3/10 to be waking only 2x a night Baseline:  Goal status: on-going   2.  PT to be able to sit  for 60 minutes without increased pain to eat a meal without increased  pain Baseline:  Goal status: on-going   3.   Pt to be able to stand for 30 minutes in order to make  meal  Baseline:  Goal status:on-going   4.  Pt to be able to walk for 45 minutes without increasing her pain for shopping tasks.  Baseline:  Goal status: on-going   5.  PT to increase her core and LE strength to be able to go up and down 4 steps in a reciprocal manner.  Baseline:  Goal status: on-going    PLAN:  PT FREQUENCY: 2x/week  PT DURATION: 6 weeks  PLANNED INTERVENTIONS: 97110-Therapeutic exercises, 97530- Therapeutic activity, [id number]- Neuromuscular re-education, 97535- Self Care, 91478- Manual therapy, and Patient/Family education.  PLAN FOR NEXT SESSION: f/u compliance with HEP; add theraband with decompression and stabilization exercises.  Leodis Rainwater, PT CLT [phone] 12/17/2023, 1:43 PM  12/17/2023, 1:43 PM

## 2023-12-18 DIAGNOSIS — M5126 Other intervertebral disc displacement, lumbar region: Secondary | ICD-10-CM | POA: Diagnosis not present

## 2023-12-18 DIAGNOSIS — M48061 Spinal stenosis, lumbar region without neurogenic claudication: Secondary | ICD-10-CM | POA: Diagnosis not present

## 2023-12-18 DIAGNOSIS — M4807 Spinal stenosis, lumbosacral region: Secondary | ICD-10-CM | POA: Diagnosis not present

## 2023-12-18 DIAGNOSIS — M5416 Radiculopathy, lumbar region: Secondary | ICD-10-CM | POA: Diagnosis not present

## 2023-12-19 ENCOUNTER — Ambulatory Visit (HOSPITAL_COMMUNITY): Payer: Medicare Other

## 2023-12-19 DIAGNOSIS — M4716 Other spondylosis with myelopathy, lumbar region: Secondary | ICD-10-CM | POA: Diagnosis not present

## 2023-12-19 DIAGNOSIS — Z6832 Body mass index (BMI) 32.0-32.9, adult: Secondary | ICD-10-CM | POA: Diagnosis not present

## 2023-12-19 DIAGNOSIS — Z79899 Other long term (current) drug therapy: Secondary | ICD-10-CM | POA: Diagnosis not present

## 2023-12-19 DIAGNOSIS — E6609 Other obesity due to excess calories: Secondary | ICD-10-CM | POA: Diagnosis not present

## 2023-12-19 DIAGNOSIS — M545 Low back pain, unspecified: Secondary | ICD-10-CM | POA: Diagnosis not present

## 2023-12-19 DIAGNOSIS — M5416 Radiculopathy, lumbar region: Secondary | ICD-10-CM | POA: Diagnosis not present

## 2023-12-19 DIAGNOSIS — G8929 Other chronic pain: Secondary | ICD-10-CM | POA: Diagnosis not present

## 2023-12-19 DIAGNOSIS — Z0000 Encounter for general adult medical examination without abnormal findings: Secondary | ICD-10-CM | POA: Diagnosis not present

## 2023-12-19 DIAGNOSIS — M546 Pain in thoracic spine: Secondary | ICD-10-CM | POA: Diagnosis not present

## 2023-12-19 DIAGNOSIS — M6281 Muscle weakness (generalized): Secondary | ICD-10-CM | POA: Diagnosis not present

## 2023-12-19 NOTE — Therapy (Signed)
OUTPATIENT PHYSICAL THERAPY THORACOLUMBAR TREATMENT   Patient Name: Morgan Rowland MRN: [id number] DOB:September 26, 1951, 73 y.o., female Today's Date: 12/19/2023  END OF SESSION:  PT End of Session - 12/19/23 1310     Visit Number 4    Number of Visits 12    Date for PT Re-Evaluation 01/16/24    Authorization Type BCBS medicare    PT Start Time 1300    PT Stop Time 1340    PT Time Calculation (min) 40 min    Activity Tolerance Patient tolerated treatment well    Behavior During Therapy [facility] for tasks assessed/performed            Past Medical History:  Diagnosis Date   Atypical mole 08/14/2010   left shin tx mohs (atypical prolif.)   Atypical nevi 08/28/2010   left heel mild/mod. no tx   Cervical disc disease    Chronic pain syndrome    Constipation 04/21/2017   Diverticulitis    External hemorrhoids    GERD (gastroesophageal reflux disease)    Hemangioma    cervical; MRI 01/2019 Dr. Trey Sailors   High cholesterol    Hypercholesteremia    IBS (irritable bowel syndrome)    Lumbar spondylosis with myelopathy    Lumbosacral pain, chronic    Numbness and tingling of right arm    Osteoporosis    Parotid tumor    Past Surgical History:  Procedure Laterality Date   APPENDECTOMY     CAROTID ENDARTERECTOMY     CHOLECYSTECTOMY     COLONOSCOPY  2011   Dr Karilyn Cota in [place]    KNEE SURGERY     Left 12/2016, 08/2017   Spinal fusion x 2     TONSILLECTOMY     TOTAL ABDOMINAL HYSTERECTOMY     Patient Active Problem List   Diagnosis Date Noted   Constipation 04/21/2017   Diverticulitis 03/02/2012   High cholesterol 03/02/2012    PCP: [facility]  REFERRING PROVIDER: Bedelia Person, MD  REFERRING DIAG: lumbar back pain  with radiculopathy affecting left lower extremity  Rationale for Evaluation and Treatment: Rehabilitation  THERAPY DIAG:  Lumbar radiculopathy Muscle weakness   ONSET DATE: chronic   SUBJECTIVE:                                                                                                                                                                                            SUBJECTIVE STATEMENT: Current low back pain  = 3/10. Patient has been doing her HEP without any issues.  PERTINENT HISTORY:  Patient fell in April of 2023 resulting in a T12 compression fracture. Reports  chronic back pain prior to this with hx of lumbar fusion x2.  Symptoms worse with prolonged standing, had some radicular symptoms into BIL LES prior to fall but worse since, had PT which helped temporarily but now she is having more pain down her left leg.  PAIN:  Are you having pain? Yes: NPRS scale: 3/10; Worst pain 9/10, Best 0/10 Pain location: Back and leg pain;  Rt radiculopathy to mid calf, Lt  radiculopathy to right above ankle Pain description: grabbing  Aggravating factors: unknown Relieving factors: prop pillow under her legs   PRECAUTIONS: None  RED FLAGS: None   WEIGHT BEARING RESTRICTIONS: No  FALLS:  Has patient fallen in last 6 months? No  LIVING ENVIRONMENT: Lives with: lives with their spouse Lives in: House/apartment Stairs: Yes: External: 4 steps; on right going up can not go up reciprocally  Has following equipment at home: Walker - 2 wheeled  OCCUPATION: retired   PLOF: Independent  PATIENT GOALS: less pain to be able to do more   NEXT MD VISIT: not scheduled until the MRI   OBJECTIVE:  Note: Objective measures were completed at Evaluation unless otherwise noted.  DIAGNOSTIC FINDINGS: IMPRESSION: 1. Subacute T12 vertebral body compression fracture with approximately 60% height loss and 4 mm of retropulsion of the superior posterior margin of the T12 vertebral body. 2. At L3-4 there is a broad-based disc bulge with a small central disc protrusion. Moderate bilateral facet arthropathy with bilateral facet effusions. Moderate-severe spinal stenosis. Mild bilateral foraminal stenosis. 3. Posterior lumbar interbody  fusion and laminectomy at L4-5 without foraminal or central canal stenosis. 4. At L5-S1 there is a mild broad-based disc bulge eccentric towards the left. Mild left foraminal stenosis.     Electronically Signed   By: Elige Ko M.D.   On: 06/13/2022 09:48 A comperassin MRI is Scheduled Jan 16   PATIENT SURVEYS:  FOTO 37  COGNITION: Overall cognitive status: Within functional limits for tasks assessed    POSTURE: rounded shoulders, forward head, decreased lumbar lordosis, and increased thoracic kyphosis  PALPATION: Tight paravertebral mm  LUMBAR ROM:   AROM eval  Flexion   Extension 17  Right lateral flexion   Left lateral flexion   Right rotation   Left rotation    (Blank rows = not tested)   LOWER EXTREMITY MMT:    MMT Right eval Left eval  Hip flexion 4 4  Hip extension 2 2  Hip abduction 4+ 3  Hip adduction    Hip internal rotation    Hip external rotation    Knee flexion 3 3  Knee extension 4+ 5  Ankle dorsiflexion 3+ 4-  Ankle plantarflexion    Ankle inversion    Ankle eversion     (Blank rows = not tested)   FUNCTIONAL TESTS:  30 seconds chair stand test:  7 x Poor for age and sex  2 minute walk test: 11/28/23:  4105ft no AD Single leg stance: 11/28/23:  Rt 25" max, Lt 29" max of 3  TREATMENT DATE:  12/19/23 Recumbent bike, level 1, seat 2, 5' Seated hamstring stretch x 30" x 3 Hip vectors, YTB x 10 x 2 Standing heel/toe raises x 10 x 2 Seated hip flexor stretch x 30" x 2 Seated marches, neutral spine x 10 x 2 Seated abdominal press with physio ball, neutral spine, x 3" x 10 x 2  12/17/23 Wall arch x 10 Sitting  Sit to stand at higher level  x 10 W-back Thoracic  excursions  Supine:   Knee to chest x 5 B  Bent knee raise x 10  Bridge x 10  Thera band decompression exercises all 4 (pull over, chest, sash, ER)  x 5 each  Side lying: Hip abduction with abdominal contraction x 5 each   11/28/23: Reviewed goals Educated benefits with  HEP  473ft no AD Single leg stance: 11/28/23:  Rt 25" max, Lt 29" max of 3  Supine: Log rolling Decompression exercises 2-5 5x 5" holds Deep breathing with 1 hand on chest/1 on stomach x 1' TrA activation paired with exhale x 1' Glut sets 10x 5" Bridges 3x limited by pain and partial range due to pain   11/21/23: eval Standing extension x 5 Sitting: Good posture x 10 Scapular retraction x 10 Ab set x 10                                                                                                                                PATIENT EDUCATION:  Education details: posture, HEP Person educated: Patient Education method: Explanation and Handouts Education comprehension: verbalized understanding  HOME EXERCISE PROGRAM:   Access Code: [email or web address] URL: [email or web address] Date: 11/21/2023 Prepared by: Virgina Organ  Exercises - Correct Seated Posture  - 3 x daily - 7 x weekly - 1 sets - 5 reps - 3-5" hold - Seated Scapular Retraction  - 2 x daily - 7 x weekly - 1 sets - 10 reps - 3-5" hold - Seated Transversus Abdominis Bracing  - 5 x daily - 7 x weekly - 1 sets - 10 reps - 5" hold - Standing Lumbar Extension  - 3 x daily - 7 x weekly - 1 sets - 5-10 reps - 3" hold   Access Code: [email or web address] URL: [email or web address] 12/19/23 - Seated Hip Flexor Stretch  - 2 x daily - 7 x weekly - 3 reps - 30 hold - Seated March  - 2 x daily - 7 x weekly - 2 sets - 10 reps - Seated Abdominal Press into [employer]  - 2 x daily - 7 x weekly - 2 sets - 10 reps - 3 hold  Date: 11/28/2023 Prepared by: Becky Sax  Exercises - Supine Transversus Abdominis Bracing - Hands on Stomach  - 2 x daily - 7 x weekly - 2 sets - 10 reps - 5" hold - Hooklying Gluteal Sets  - 2 x daily - 7 x weekly - 2 sets - 10 reps - 5" hold - Supine Bridge  - 2 x daily - 7 x weekly - 2 sets - 10 reps - 5" hold 12/17/23 Decompression with yellow t-band  ASSESSMENT:  CLINICAL  IMPRESSION: Interventions today were geared towards LE flexibility, core and LE strengthening. Tolerated all activities without worsening of symptoms. However patient had a cramp on her R groin after the toe raises. Patient states that the hip flexor stretch helped with the cramp. Demonstrated appropriate levels  of fatigue. Provided slight amount of cueing to ensure correct execution of activity with good carry-over. To date, skilled PT is required to address the impairments and improve function.  Eval: Patient is a 73 y.o. female who was seen today for physical therapy evaluation and treatment for lumbar radiculopathy.  Evaluation demonstrates decreased activity tolerance, decreased strength, increased pain and postural dysfunction.  Ms. Zhou will benefit from skilled PT to address these deficits and improve her functional ability.     OBJECTIVE IMPAIRMENTS: Abnormal gait, decreased activity tolerance, decreased mobility, difficulty walking, decreased ROM, decreased strength, and pain.   ACTIVITY LIMITATIONS: carrying, lifting, bending, sitting, standing, squatting, sleeping, stairs, transfers, bathing, dressing, and locomotion level  PARTICIPATION LIMITATIONS: meal prep, cleaning, laundry, driving, shopping, and community activity  PERSONAL FACTORS: Age, Fitness, Time since onset of injury/illness/exacerbation, and 3+ comorbidities: OA, osteoporosis, 3 back surgeries.   are also affecting patient's functional outcome.   REHAB POTENTIAL: Good  CLINICAL DECISION MAKING: Evolving/moderate complexity  EVALUATION COMPLEXITY: Moderate   GOALS: Goals reviewed with patient? No  SHORT TERM GOALS: Target date: 12/19/23  PT to be I in HEP to decrease her back pain to no greater than a 6/10 to be waking only 2x a night Baseline: Goal status: on-going   2.  PT to be able to sit for 45 minutes without increased pain to eat a meal without increased  pain  Baseline:  Goal status: on-going   3.   Pt to be able to stand for 15 minutes in order to make a quick meal ie sandwich without increased pain  Baseline:  Goal status: on-going   4.  Pt to be able to walk for 30 minutes without increasing her pain for shopping tasks.  Baseline:  Goal status: on-going   5.  Radicular sx to be above knee level  Baseline:  Goal status: on-going     LONG TERM GOALS: Target date: 01/16/24  PT to be I in HEP to decrease her back pain to no greater than a 3/10 to be waking only 2x a night Baseline:  Goal status: on-going   2.  PT to be able to sit for 60 minutes without increased pain to eat a meal without increased  pain Baseline:  Goal status: on-going   3.   Pt to be able to stand for 30 minutes in order to make  meal  Baseline:  Goal status:on-going   4.  Pt to be able to walk for 45 minutes without increasing her pain for shopping tasks.  Baseline:  Goal status: on-going   5.  PT to increase her core and LE strength to be able to go up and down 4 steps in a reciprocal manner.  Baseline:  Goal status: on-going    PLAN:  PT FREQUENCY: 2x/week  PT DURATION: 6 weeks  PLANNED INTERVENTIONS: 97110-Therapeutic exercises, 97530- Therapeutic activity, [id number]- Neuromuscular re-education, 97535- Self Care, 69629- Manual therapy, and Patient/Family education.  PLAN FOR NEXT SESSION: f/u compliance with HEP; add theraband with decompression and stabilization exercises.  Tish Frederickson. Lugene Beougher, PT, DPT, OCS Board-Certified Clinical Specialist in Orthopedic PT PT Compact Privilege # ([place]): [id number] T  [phone]  12/19/2023, 1:10 PM  12/19/2023, 1:10 PM

## 2023-12-24 ENCOUNTER — Ambulatory Visit (HOSPITAL_COMMUNITY): Payer: Medicare Other | Admitting: Physical Therapy

## 2023-12-24 DIAGNOSIS — M5416 Radiculopathy, lumbar region: Secondary | ICD-10-CM

## 2023-12-24 DIAGNOSIS — M6281 Muscle weakness (generalized): Secondary | ICD-10-CM

## 2023-12-24 DIAGNOSIS — M546 Pain in thoracic spine: Secondary | ICD-10-CM | POA: Diagnosis not present

## 2023-12-24 DIAGNOSIS — Z79899 Other long term (current) drug therapy: Secondary | ICD-10-CM | POA: Diagnosis not present

## 2023-12-24 NOTE — Therapy (Signed)
OUTPATIENT PHYSICAL THERAPY THORACOLUMBAR TREATMENT   Patient Name: Morgan Rowland MRN: [id number] DOB:10-Aug-1951, 73 y.o., female Today's Date: 12/24/2023  END OF SESSION:  PT End of Session - 12/24/23 1342     Visit Number 5    Number of Visits 12    Date for PT Re-Evaluation 01/16/24    Authorization Type BCBS medicare    Progress Note Due on Visit 10    PT Start Time 1303    PT Stop Time 1342    PT Time Calculation (min) 39 min    Activity Tolerance Patient tolerated treatment well    Behavior During Therapy [facility] for tasks assessed/performed             Past Medical History:  Diagnosis Date   Atypical mole 08/14/2010   left shin tx mohs (atypical prolif.)   Atypical nevi 08/28/2010   left heel mild/mod. no tx   Cervical disc disease    Chronic pain syndrome    Constipation 04/21/2017   Diverticulitis    External hemorrhoids    GERD (gastroesophageal reflux disease)    Hemangioma    cervical; MRI 01/2019 Dr. Trey Sailors   High cholesterol    Hypercholesteremia    IBS (irritable bowel syndrome)    Lumbar spondylosis with myelopathy    Lumbosacral pain, chronic    Numbness and tingling of right arm    Osteoporosis    Parotid tumor    Past Surgical History:  Procedure Laterality Date   APPENDECTOMY     CAROTID ENDARTERECTOMY     CHOLECYSTECTOMY     COLONOSCOPY  2011   Dr Karilyn Cota in [place]   KNEE SURGERY     Left 12/2016, 08/2017   Spinal fusion x 2     TONSILLECTOMY     TOTAL ABDOMINAL HYSTERECTOMY     Patient Active Problem List   Diagnosis Date Noted   Constipation 04/21/2017   Diverticulitis 03/02/2012   High cholesterol 03/02/2012    PCP: [facility]  REFERRING PROVIDER: Bedelia Person, MD  REFERRING DIAG: lumbar back pain  with radiculopathy affecting left lower extremity  Rationale for Evaluation and Treatment: Rehabilitation  THERAPY DIAG:  Lumbar radiculopathy Muscle weakness   ONSET DATE: chronic   SUBJECTIVE:                                                                                                                                                                                            SUBJECTIVE STATEMENT: Pt states that she has had to be on her feet a lot as her mother's pipes froze.   She is having  pain down both her legs at this time mainly on the left to lateral thigh to the knee.  Her pain has been high all day today she believes it is due to her sleeping on her left side but she can not change what she does in her sleep  PERTINENT HISTORY:  Patient fell in April of 2023 resulting in a T12 compression fracture. Reports chronic back pain prior to this with hx of lumbar fusion x2.  Symptoms worse with prolonged standing, had some radicular symptoms into BIL LES prior to fall but worse since, had PT which helped temporarily but now she is having more pain down her left leg.  PAIN:  Are you having pain? Yes: NPRS scale: 4/10; Worst pain 9/10, Best 0/10 Pain location: Back and leg pain;  Rt radiculopathy to mid calf, Lt  radiculopathy to right above ankle Pain description: grabbing  Aggravating factors: unknown Relieving factors: prop pillow under her legs   PRECAUTIONS: None  RED FLAGS: None   WEIGHT BEARING RESTRICTIONS: No  FALLS:  Has patient fallen in last 6 months? No  LIVING ENVIRONMENT: Lives with: lives with their spouse Lives in: House/apartment Stairs: Yes: External: 4 steps; on right going up can not go up reciprocally  Has following equipment at home: Walker - 2 wheeled  OCCUPATION: retired   PLOF: Independent  PATIENT GOALS: less pain to be able to do more   NEXT MD VISIT: not scheduled until the MRI   OBJECTIVE:  Note: Objective measures were completed at Evaluation unless otherwise noted.  DIAGNOSTIC FINDINGS: IMPRESSION: 1. Subacute T12 vertebral body compression fracture with approximately 60% height loss and 4 mm of retropulsion of the superior posterior margin of  the T12 vertebral body. 2. At L3-4 there is a broad-based disc bulge with a small central disc protrusion. Moderate bilateral facet arthropathy with bilateral facet effusions. Moderate-severe spinal stenosis. Mild bilateral foraminal stenosis. 3. Posterior lumbar interbody fusion and laminectomy at L4-5 without foraminal or central canal stenosis. 4. At L5-S1 there is a mild broad-based disc bulge eccentric towards the left. Mild left foraminal stenosis.     Electronically Signed   By: Elige Ko M.D.   On: 06/13/2022 09:48 A comperassin MRI is Scheduled Jan 16   PATIENT SURVEYS:  FOTO 37  COGNITION: Overall cognitive status: Within functional limits for tasks assessed    POSTURE: rounded shoulders, forward head, decreased lumbar lordosis, and increased thoracic kyphosis  PALPATION: Tight paravertebral mm  LUMBAR ROM:   AROM eval  Flexion   Extension 17  Right lateral flexion   Left lateral flexion   Right rotation   Left rotation    (Blank rows = not tested)   LOWER EXTREMITY MMT:    MMT Right eval Left eval  Hip flexion 4 4  Hip extension 2 2  Hip abduction 4+ 3  Hip adduction    Hip internal rotation    Hip external rotation    Knee flexion 3 3  Knee extension 4+ 5  Ankle dorsiflexion 3+ 4-  Ankle plantarflexion    Ankle inversion    Ankle eversion     (Blank rows = not tested)   FUNCTIONAL TESTS:  30 seconds chair stand test:  7 x Poor for age and sex  2 minute walk test: 11/28/23:  429ft no AD Single leg stance: 11/28/23:  Rt 25" max, Lt 29" max of 3  TREATMENT DATE:  12/24/23 supine:  HMP with exercises to decrease pain Diaphragm breathing  with instructions on how this affects the parasympathetic nervous sx.   Combination glut set and ab set x 10 Scapular retraction x 10  Knee to chest with  towel 30"x 3 B  Active hamstring stretch x 3  Dead bug x 10 Bridge with hip adduction x 10  Standing: Mini squat x 5 Side stepping x 2 RT Heel  raise x 10  12/19/23 Recumbent bike, level 1, seat 2, 5' Seated hamstring stretch x 30" x 3 Hip vectors, YTB x 10 x 2 Standing heel/toe raises x 10 x 2 Seated hip flexor stretch x 30" x 2 Seated marches, neutral spine x 10 x 2 Seated abdominal press with physio ball, neutral spine, x 3" x 10 x 2  12/17/23 Wall arch x 10 Sitting  Sit to stand at higher level  x 10 W-back Thoracic excursions  Supine:   Knee to chest x 5 B  Bent knee raise x 10  Bridge x 10  Thera band decompression exercises all 4 (pull over, chest, sash, ER)  x 5 each  Side lying: Hip abduction with abdominal contraction x 5 each   11/28/23: Reviewed goals Educated benefits with HEP  41ft no AD Single leg stance: 11/28/23:  Rt 25" max, Lt 29" max of 3  Supine: Log rolling Decompression exercises 2-5 5x 5" holds Deep breathing with 1 hand on chest/1 on stomach x 1' TrA activation paired with exhale x 1' Glut sets 10x 5" Bridges 3x limited by pain and partial range due to pain    PATIENT EDUCATION:  Education details: posture, HEP Person educated: Patient Education method: Explanation and Handouts Education comprehension: verbalized understanding  HOME EXERCISE PROGRAM:   Access Code: [email or web address] URL: [email or web address] Date: 11/21/2023 Prepared by: Virgina Organ  Exercises - Correct Seated Posture  - 3 x daily - 7 x weekly - 1 sets - 5 reps - 3-5" hold - Seated Scapular Retraction  - 2 x daily - 7 x weekly - 1 sets - 10 reps - 3-5" hold - Seated Transversus Abdominis Bracing  - 5 x daily - 7 x weekly - 1 sets - 10 reps - 5" hold - Standing Lumbar Extension  - 3 x daily - 7 x weekly - 1 sets - 5-10 reps - 3" hold   Access Code: [email or web address] URL: [email or web address] 12/19/23 - Seated Hip Flexor Stretch  - 2 x daily - 7 x weekly - 3 reps - 30 hold - Seated March  - 2 x daily - 7 x weekly - 2 sets - 10 reps - Seated Abdominal Press into [employer]  - 2 x daily  - 7 x weekly - 2 sets - 10 reps - 3 hold  Date: 11/28/2023 Prepared by: Becky Sax  Exercises - Supine Transversus Abdominis Bracing - Hands on Stomach  - 2 x daily - 7 x weekly - 2 sets - 10 reps - 5" hold - Hooklying Gluteal Sets  - 2 x daily - 7 x weekly - 2 sets - 10 reps - 5" hold - Supine Bridge  - 2 x daily - 7 x weekly - 2 sets - 10 reps - 5" hold 12/17/23 Decompression with yellow t-band  ASSESSMENT:  CLINICAL IMPRESSION: Pt with significant increased pain today.  Therapist educated on diaphragm breathing to reduce tension and pain.  Therapy session geared to decreasing pain today.  Pt continues to have decreased ROM, strength and activity tolerance as well as increased pain.   To date,  skilled PT is required to address the impairments and improve function.  Eval: Patient is a 73 y.o. female who was seen today for physical therapy evaluation and treatment for lumbar radiculopathy.  Evaluation demonstrates decreased activity tolerance, decreased strength, increased pain and postural dysfunction.  Ms. Ayoub will benefit from skilled PT to address these deficits and improve her functional ability.     OBJECTIVE IMPAIRMENTS: Abnormal gait, decreased activity tolerance, decreased mobility, difficulty walking, decreased ROM, decreased strength, and pain.   ACTIVITY LIMITATIONS: carrying, lifting, bending, sitting, standing, squatting, sleeping, stairs, transfers, bathing, dressing, and locomotion level  PARTICIPATION LIMITATIONS: meal prep, cleaning, laundry, driving, shopping, and community activity  PERSONAL FACTORS: Age, Fitness, Time since onset of injury/illness/exacerbation, and 3+ comorbidities: OA, osteoporosis, 3 back surgeries.   are also affecting patient's functional outcome.   REHAB POTENTIAL: Good  CLINICAL DECISION MAKING: Evolving/moderate complexity  EVALUATION COMPLEXITY: Moderate   GOALS: Goals reviewed with patient? No  SHORT TERM GOALS: Target date:  12/19/23  PT to be I in HEP to decrease her back pain to no greater than a 6/10 to be waking only 2x a night Baseline: Goal status: on-going   2.  PT to be able to sit for 45 minutes without increased pain to eat a meal without increased  pain  Baseline:  Goal status: on-going   3.  Pt to be able to stand for 15 minutes in order to make a quick meal ie sandwich without increased pain  Baseline:  Goal status: on-going   4.  Pt to be able to walk for 30 minutes without increasing her pain for shopping tasks.  Baseline:  Goal status: on-going   5.  Radicular sx to be above knee level  Baseline:  Goal status: on-going     LONG TERM GOALS: Target date: 01/16/24  PT to be I in HEP to decrease her back pain to no greater than a 3/10 to be waking only 2x a night Baseline:  Goal status: on-going   2.  PT to be able to sit for 60 minutes without increased pain to eat a meal without increased  pain Baseline:  Goal status: on-going   3.   Pt to be able to stand for 30 minutes in order to make  meal  Baseline:  Goal status:on-going   4.  Pt to be able to walk for 45 minutes without increasing her pain for shopping tasks.  Baseline:  Goal status: on-going   5.  PT to increase her core and LE strength to be able to go up and down 4 steps in a reciprocal manner.  Baseline:  Goal status: on-going    PLAN:  PT FREQUENCY: 2x/week  PT DURATION: 6 weeks  PLANNED INTERVENTIONS: 97110-Therapeutic exercises, 97530- Therapeutic activity, [id number]- Neuromuscular re-education, 97535- Self Care, 16109- Manual therapy, and Patient/Family education.  PLAN FOR NEXT SESSION: f/u compliance with HEP; add theraband with decompression and stabilization exercises.  Virgina Organ, PT CLT [phone]  12/24/2023, 1:43 PM  12/24/2023, 1:43 PM

## 2024-01-01 ENCOUNTER — Ambulatory Visit (HOSPITAL_COMMUNITY): Payer: Medicare Other | Admitting: Physical Therapy

## 2024-01-01 DIAGNOSIS — M6281 Muscle weakness (generalized): Secondary | ICD-10-CM

## 2024-01-01 DIAGNOSIS — M546 Pain in thoracic spine: Secondary | ICD-10-CM | POA: Diagnosis not present

## 2024-01-01 DIAGNOSIS — M5416 Radiculopathy, lumbar region: Secondary | ICD-10-CM

## 2024-01-01 NOTE — Therapy (Signed)
OUTPATIENT PHYSICAL THERAPY THORACOLUMBAR TREATMENT   Patient Name: Morgan Rowland MRN: [id number] DOB:[birth year], 73 y.o., female Today's Date: 01/01/2024  END OF SESSION:  PT End of Session - 01/01/24 1229     Visit Number 6    Number of Visits 12    Date for PT Re-Evaluation 01/16/24    Authorization Type BCBS medicare    Progress Note Due on Visit 10    PT Start Time 1150    PT Stop Time 1230    PT Time Calculation (min) 40 min    Activity Tolerance Patient tolerated treatment well    Behavior During Therapy [facility] for tasks assessed/performed              Past Medical History:  Diagnosis Date   Atypical mole 08/14/2010   left shin tx mohs (atypical prolif.)   Atypical nevi 08/28/2010   left heel mild/mod. no tx   Cervical disc disease    Chronic pain syndrome    Constipation 04/21/2017   Diverticulitis    External hemorrhoids    GERD (gastroesophageal reflux disease)    Hemangioma    cervical; MRI 01/2019 Dr. Trey Sailors   High cholesterol    Hypercholesteremia    IBS (irritable bowel syndrome)    Lumbar spondylosis with myelopathy    Lumbosacral pain, chronic    Numbness and tingling of right arm    Osteoporosis    Parotid tumor    Past Surgical History:  Procedure Laterality Date   APPENDECTOMY     CAROTID ENDARTERECTOMY     CHOLECYSTECTOMY     COLONOSCOPY  2011   Dr Karilyn Cota in [place]   KNEE SURGERY     Left 12/2016, 08/2017   Spinal fusion x 2     TONSILLECTOMY     TOTAL ABDOMINAL HYSTERECTOMY     Patient Active Problem List   Diagnosis Date Noted   Constipation 04/21/2017   Diverticulitis 03/02/2012   High cholesterol 03/02/2012    [facility]  REFERRING PROVIDER: Bedelia Person, MD  REFERRING DIAG: lumbar back pain  with radiculopathy affecting left lower extremity  Rationale for Evaluation and Treatment: Rehabilitation  THERAPY DIAG:  Lumbar radiculopathy Muscle weakness   ONSET DATE: chronic   SUBJECTIVE:                                                                                                                                                                                            SUBJECTIVE STATEMENT: PT states that her back is felling better her legs are bothering her; Lt greater than right.  Nerve is shooting down  her leg.  She sees the neurosurgeon tomorrow.   PERTINENT HISTORY:  Patient fell in April of 2023 resulting in a T12 compression fracture. Reports chronic back pain prior to this with hx of lumbar fusion x2.  Symptoms worse with prolonged standing, had some radicular symptoms into BIL LES prior to fall but worse since, had PT which helped temporarily but now she is having more pain down her left leg.  PAIN:  Are you having pain? Yes: NPRS scale: 4/10; Worst pain 9/10, Best 0/10 Pain location: Back and leg pain;  Rt radiculopathy to mid calf, Lt  radiculopathy to right above ankle Pain description: grabbing  Aggravating factors: unknown Relieving factors: prop pillow under her legs   PRECAUTIONS: None  RED FLAGS: None   WEIGHT BEARING RESTRICTIONS: No  FALLS:  Has patient fallen in last 6 months? No  LIVING ENVIRONMENT: Lives with: lives with their spouse Lives in: House/apartment Stairs: Yes: External: 4 steps; on right going up can not go up reciprocally  Has following equipment at home: Walker - 2 wheeled  OCCUPATION: retired   PLOF: Independent  PATIENT GOALS: less pain to be able to do more   NEXT MD VISIT: not scheduled until the MRI   OBJECTIVE:  Note: Objective measures were completed at Evaluation unless otherwise noted.  DIAGNOSTIC FINDINGS: IMPRESSION: 1. Subacute T12 vertebral body compression fracture with approximately 60% height loss and 4 mm of retropulsion of the superior posterior margin of the T12 vertebral body. 2. At L3-4 there is a broad-based disc bulge with a small central disc protrusion. Moderate bilateral facet arthropathy with  bilateral facet effusions. Moderate-severe spinal stenosis. Mild bilateral foraminal stenosis. 3. Posterior lumbar interbody fusion and laminectomy at L4-5 without foraminal or central canal stenosis. 4. At L5-S1 there is a mild broad-based disc bulge eccentric towards the left. Mild left foraminal stenosis.     Electronically Signed   By: Elige Ko M.D.   On: 06/13/2022 09:48 A comperassin MRI is Scheduled Jan 16   PATIENT SURVEYS:  FOTO 37  COGNITION: Overall cognitive status: Within functional limits for tasks assessed    POSTURE: rounded shoulders, forward head, decreased lumbar lordosis, and increased thoracic kyphosis  PALPATION: Tight paravertebral mm  LUMBAR ROM:   AROM eval  Flexion   Extension 17  Right lateral flexion   Left lateral flexion   Right rotation   Left rotation    (Blank rows = not tested)   LOWER EXTREMITY MMT:    MMT Right eval Left eval  Hip flexion 4 4  Hip extension 2 2  Hip abduction 4+ 3  Hip adduction    Hip internal rotation    Hip external rotation    Knee flexion 3 3  Knee extension 4+ 5  Ankle dorsiflexion 3+ 4-  Ankle plantarflexion    Ankle inversion    Ankle eversion     (Blank rows = not tested)   FUNCTIONAL TESTS:  30 seconds chair stand test:  7 x Poor for age and sex  2 minute walk test: 11/28/23:  484ft no AD Single leg stance: 11/28/23:  Rt 25" max, Lt 29" max of 3  TREATMENT DATE:  01/01/24 Wall arch x 10 Knee to chest x 30" x 3  Hamstring stretch with nerve floss x 3  Ab/glut set combination x 10  Clam with resistance x 10 Bridge with resistance x 10  Bent knee raise with resistance x 10  Side step with resistance x 10  Mini squat with resistance x 10  Heel raise x 10  Nustep level 2 x 5:00  12/24/23 supine:  HMP with exercises to decrease pain Diaphragm breathing with instructions on how this affects the parasympathetic nervous sx.   Combination glut set and ab set x 10 Scapular retraction x  10  Knee to chest with  towel 30"x 3 B  Active hamstring stretch x 3  Dead bug x 10 Bridge with hip adduction x 10  Standing: Mini squat x 5 Side stepping x 2 RT Heel raise x 10     PATIENT EDUCATION:  Education details: posture, HEP Person educated: Patient Education method: Explanation and Handouts Education comprehension: verbalized understanding  HOME EXERCISE PROGRAM:   Access Code: [email or web address] URL: [email or web address] Date: 11/21/2023 Prepared by: Virgina Organ  Exercises - Correct Seated Posture  - 3 x daily - 7 x weekly - 1 sets - 5 reps - 3-5" hold - Seated Scapular Retraction  - 2 x daily - 7 x weekly - 1 sets - 10 reps - 3-5" hold - Seated Transversus Abdominis Bracing  - 5 x daily - 7 x weekly - 1 sets - 10 reps - 5" hold - Standing Lumbar Extension  - 3 x daily - 7 x weekly - 1 sets - 5-10 reps - 3" hold   Access Code: [email or web address] URL: [email or web address] 12/19/23 - Seated Hip Flexor Stretch  - 2 x daily - 7 x weekly - 3 reps - 30 hold - Seated March  - 2 x daily - 7 x weekly - 2 sets - 10 reps - Seated Abdominal Press into [employer]  - 2 x daily - 7 x weekly - 2 sets - 10 reps - 3 hold  Date: 11/28/2023 Prepared by: Becky Sax  Exercises - Supine Transversus Abdominis Bracing - Hands on Stomach  - 2 x daily - 7 x weekly - 2 sets - 10 reps - 5" hold - Hooklying Gluteal Sets  - 2 x daily - 7 x weekly - 2 sets - 10 reps - 5" hold - Supine Bridge  - 2 x daily - 7 x weekly - 2 sets - 10 reps - 5" hold 12/17/23 Decompression with yellow t-band  12/22/23 - Supine Single Knee to Chest Stretch  - 2 x daily - 7 x weekly - 1 sets - 3 reps - 30" hold - Hooklying Active Hamstring Stretch  - 2 x daily - 7 x weekly - 1 sets - 3 reps - 30" hold - Hooklying Clamshell with Resistance  - 2 x daily - 7 x weekly - 1 sets - 10 reps - 3-5" hold - Supine Bridge with Resistance Band  - 2 x daily - 7 x weekly - 1 sets - 10 reps - 3-5"  hold ASSESSMENT:  CLINICAL IMPRESSION: Advanced Pt HEP to include resisted bridges, clams and marches. Pt able to demonstrate good stability with verbal cuingl  Pt able to lift buttock with bridge today.    Pt continues to have decreased ROM, strength and activity tolerance as well as increased pain.   To date, skilled PT is required to address the impairments and improve function.  Eval: Patient is a 73 y.o. female who was seen today for physical therapy evaluation and treatment for lumbar radiculopathy.  Evaluation demonstrates decreased activity tolerance, decreased strength, increased pain and postural dysfunction.  Ms. Echevarria will benefit from skilled PT to address these deficits and improve her functional ability.  OBJECTIVE IMPAIRMENTS: Abnormal gait, decreased activity tolerance, decreased mobility, difficulty walking, decreased ROM, decreased strength, and pain.   ACTIVITY LIMITATIONS: carrying, lifting, bending, sitting, standing, squatting, sleeping, stairs, transfers, bathing, dressing, and locomotion level  PARTICIPATION LIMITATIONS: meal prep, cleaning, laundry, driving, shopping, and community activity  PERSONAL FACTORS: Age, Fitness, Time since onset of injury/illness/exacerbation, and 3+ comorbidities: OA, osteoporosis, 3 back surgeries.   are also affecting patient's functional outcome.   REHAB POTENTIAL: Good  CLINICAL DECISION MAKING: Evolving/moderate complexity  EVALUATION COMPLEXITY: Moderate   GOALS: Goals reviewed with patient? No  SHORT TERM GOALS: Target date: 12/19/23  PT to be I in HEP to decrease her back pain to no greater than a 6/10 to be waking only 2x a night Baseline: Goal status: on-going   2.  PT to be able to sit for 45 minutes without increased pain to eat a meal without increased  pain  Baseline:  Goal status: on-going   3.  Pt to be able to stand for 15 minutes in order to make a quick meal ie sandwich without increased pain  Baseline:   Goal status: on-going   4.  Pt to be able to walk for 30 minutes without increasing her pain for shopping tasks.  Baseline:  Goal status: on-going   5.  Radicular sx to be above knee level  Baseline:  Goal status: on-going     LONG TERM GOALS: Target date: 01/16/24  PT to be I in HEP to decrease her back pain to no greater than a 3/10 to be waking only 2x a night Baseline:  Goal status: on-going   2.  PT to be able to sit for 60 minutes without increased pain to eat a meal without increased  pain Baseline:  Goal status: on-going   3.   Pt to be able to stand for 30 minutes in order to make  meal  Baseline:  Goal status:on-going   4.  Pt to be able to walk for 45 minutes without increasing her pain for shopping tasks.  Baseline:  Goal status: on-going   5.  PT to increase her core and LE strength to be able to go up and down 4 steps in a reciprocal manner.  Baseline:  Goal status: on-going    PLAN:  PT FREQUENCY: 2x/week  PT DURATION: 6 weeks  PLANNED INTERVENTIONS: 97110-Therapeutic exercises, 97530- Therapeutic activity, [id number]- Neuromuscular re-education, 97535- Self Care, [id number]- Manual therapy, and Patient/Family education.  PLAN FOR NEXT SESSION:continue to progress core strengthening exercises  Virgina Organ, PT [place] [phone]  01/01/2024, 12:29 PM

## 2024-01-02 DIAGNOSIS — M5416 Radiculopathy, lumbar region: Secondary | ICD-10-CM | POA: Diagnosis not present

## 2024-01-02 DIAGNOSIS — Z6831 Body mass index (BMI) 31.0-31.9, adult: Secondary | ICD-10-CM | POA: Diagnosis not present

## 2024-01-07 ENCOUNTER — Encounter (HOSPITAL_COMMUNITY): Payer: Medicare Other | Admitting: Physical Therapy

## 2024-01-07 DIAGNOSIS — M810 Age-related osteoporosis without current pathological fracture: Secondary | ICD-10-CM | POA: Diagnosis not present

## 2024-01-07 DIAGNOSIS — Z780 Asymptomatic menopausal state: Secondary | ICD-10-CM | POA: Diagnosis not present

## 2024-01-07 DIAGNOSIS — M8588 Other specified disorders of bone density and structure, other site: Secondary | ICD-10-CM | POA: Diagnosis not present

## 2024-01-08 DIAGNOSIS — Z01818 Encounter for other preprocedural examination: Secondary | ICD-10-CM | POA: Diagnosis not present

## 2024-01-08 DIAGNOSIS — M545 Low back pain, unspecified: Secondary | ICD-10-CM | POA: Diagnosis not present

## 2024-01-08 DIAGNOSIS — Z6828 Body mass index (BMI) 28.0-28.9, adult: Secondary | ICD-10-CM | POA: Diagnosis not present

## 2024-01-09 ENCOUNTER — Ambulatory Visit (HOSPITAL_COMMUNITY): Payer: Medicare Other | Attending: Neurosurgery

## 2024-01-09 DIAGNOSIS — M546 Pain in thoracic spine: Secondary | ICD-10-CM

## 2024-01-09 DIAGNOSIS — M6281 Muscle weakness (generalized): Secondary | ICD-10-CM

## 2024-01-09 DIAGNOSIS — M5416 Radiculopathy, lumbar region: Secondary | ICD-10-CM | POA: Diagnosis not present

## 2024-01-09 NOTE — Therapy (Addendum)
 OUTPATIENT PHYSICAL THERAPY THORACOLUMBAR TREATMENT   Patient Name: Morgan Rowland MRN: [id number] DOB:December 20, 1950, 73 y.o., female Today's Date: 01/09/2024  END OF SESSION: END OF SESSION:   PT End of Session - 01/09/24 1149     Visit Number 7    Number of Visits 12    Date for PT Re-Evaluation 01/16/24    Authorization Type BCBS medicare    PT Start Time 1145    PT Stop Time 1225    PT Time Calculation (min) 40 min    Activity Tolerance Patient tolerated treatment well    Behavior During Therapy [facility] for tasks assessed/performed                Past Medical History:  Diagnosis Date   Atypical mole 08/14/2010   left shin tx mohs (atypical prolif.)   Atypical nevi 08/28/2010   left heel mild/mod. no tx   Cervical disc disease    Chronic pain syndrome    Constipation 04/21/2017   Diverticulitis    External hemorrhoids    GERD (gastroesophageal reflux disease)    Hemangioma    cervical; MRI 01/2019 Dr. Oneil Carwin   High cholesterol    Hypercholesteremia    IBS (irritable bowel syndrome)    Lumbar spondylosis with myelopathy    Lumbosacral pain, chronic    Numbness and tingling of right arm    Osteoporosis    Parotid tumor    Past Surgical History:  Procedure Laterality Date   APPENDECTOMY     CAROTID ENDARTERECTOMY     CHOLECYSTECTOMY     COLONOSCOPY  2011   Dr Golda in [place]    KNEE SURGERY     Left 12/2016, 08/2017   Spinal fusion x 2     TONSILLECTOMY     TOTAL ABDOMINAL HYSTERECTOMY     Patient Active Problem List   Diagnosis Date Noted   Constipation 04/21/2017   Diverticulitis 03/02/2012   High cholesterol 03/02/2012    PCP: [facility]  REFERRING PROVIDER: Debby Dorn MATSU, MD  REFERRING DIAG: lumbar back pain  with radiculopathy affecting left lower extremity  Rationale for Evaluation and Treatment: Rehabilitation  THERAPY DIAG:  Lumbar radiculopathy Muscle weakness   ONSET DATE: chronic   SUBJECTIVE:                                                                                                                                                                                            SUBJECTIVE STATEMENT: Currently reports of pain = 4/10 from the L hip, in front of the thigh and front of the knee. Patient went to her MD  last Friday and was told that she has a herniated disc affecting her L3.  PERTINENT HISTORY:  Patient fell in April of 2023 resulting in a T12 compression fracture. Reports chronic back pain prior to this with hx of lumbar fusion x2.  Symptoms worse with prolonged standing, had some radicular symptoms into BIL LES prior to fall but worse since, had PT which helped temporarily but now she is having more pain down her left leg.  PAIN:  Are you having pain? Yes: NPRS scale: 4/10; Worst pain 9/10, Best 0/10 Pain location: Back and leg pain;  Rt radiculopathy to mid calf, Lt  radiculopathy to right above ankle Pain description: grabbing  Aggravating factors: unknown Relieving factors: prop pillow under her legs   PRECAUTIONS: None  RED FLAGS: None   WEIGHT BEARING RESTRICTIONS: No  FALLS:  Has patient fallen in last 6 months? No  LIVING ENVIRONMENT: Lives with: lives with their spouse Lives in: House/apartment Stairs: Yes: External: 4 steps; on right going up can not go up reciprocally  Has following equipment at home: Walker - 2 wheeled  OCCUPATION: retired   PLOF: Independent  PATIENT GOALS: less pain to be able to do more   NEXT MD VISIT: not scheduled until the MRI   OBJECTIVE:  Note: Objective measures were completed at Evaluation unless otherwise noted.  DIAGNOSTIC FINDINGS: IMPRESSION: 1. Subacute T12 vertebral body compression fracture with approximately 60% height loss and 4 mm of retropulsion of the superior posterior margin of the T12 vertebral body. 2. At L3-4 there is a broad-based disc bulge with a small central disc protrusion. Moderate bilateral facet arthropathy  with bilateral facet effusions. Moderate-severe spinal stenosis. Mild bilateral foraminal stenosis. 3. Posterior lumbar interbody fusion and laminectomy at L4-5 without foraminal or central canal stenosis. 4. At L5-S1 there is a mild broad-based disc bulge eccentric towards the left. Mild left foraminal stenosis.     Electronically Signed   By: Julaine Blanch M.D.   On: 06/13/2022 09:48 A comperassin MRI is Scheduled Jan 16   PATIENT SURVEYS:  FOTO 37  COGNITION: Overall cognitive status: Within functional limits for tasks assessed    POSTURE: rounded shoulders, forward head, decreased lumbar lordosis, and increased thoracic kyphosis  PALPATION: Tight paravertebral mm  LUMBAR ROM:   AROM eval  Flexion   Extension 17  Right lateral flexion   Left lateral flexion   Right rotation   Left rotation    (Blank rows = not tested)   LOWER EXTREMITY MMT:    MMT Right eval Left eval  Hip flexion 4 4  Hip extension 2 2  Hip abduction 4+ 3  Hip adduction    Hip internal rotation    Hip external rotation    Knee flexion 3 3  Knee extension 4+ 5  Ankle dorsiflexion 3+ 4-  Ankle plantarflexion    Ankle inversion    Ankle eversion     (Blank rows = not tested)   FUNCTIONAL TESTS:  30 seconds chair stand test:  7 x Poor for age and sex  2 minute walk test: 11/28/23:  439ft no AD Single leg stance: 11/28/23:  Rt 25 max, Lt 29 max of 3  TREATMENT DATE:  01/09/24 L femoral nerve floss in prone x 1' POE x 5 x 10 x 2 Seated alt knee ext, neutral spine x 10 x 2 Seated abdominal press with physioball, neutral spine x 3 x 10 x 2 Seated alt UE lifts, neutral spine x 10  x 2 Gait training with SPC, CGA-SBA ~ 50 ft using modified 2-point/2-point pattern Education on proper cane height and adjustment - patient gave excellent verbal understanding  01/01/24 Wall arch x 10 Knee to chest x 30 x 3  Hamstring stretch with nerve floss x 3  Ab/glut set combination x 10  Clam  with resistance x 10 Bridge with resistance x 10  Bent knee raise with resistance x 10  Side step with resistance x 10 Mini squat with resistance x 10  Heel raise x 10  Nustep level 2 x 5:00  12/24/23 supine:  HMP with exercises to decrease pain Diaphragm breathing with instructions on how this affects the parasympathetic nervous sx.   Combination glut set and ab set x 10 Scapular retraction x 10  Knee to chest with  towel 30x 3 B  Active hamstring stretch x 3  Dead bug x 10 Bridge with hip adduction x 10  Standing: Mini squat x 5 Side stepping x 2 RT Heel raise x 10     PATIENT EDUCATION:  Education details: posture, HEP Person educated: Patient Education method: Explanation and Handouts Education comprehension: verbalized understanding  HOME EXERCISE PROGRAM:   Access Code: [email or web address] URL: [email or web address] 01/09/24 - Prone Femoral Nerve Mobilization  - 1-2 x daily - 7 x weekly - Static Prone on Elbows  - 1-2 x daily - 7 x weekly - 2 sets - 10 reps - 5 hold - Seated Abdominal Press into [employer]  - 1-2 x daily - 7 x weekly - 2 sets - 10 reps - 3 hold  Date: 11/21/2023 Prepared by: Montie Metro  Exercises - Correct Seated Posture  - 3 x daily - 7 x weekly - 1 sets - 5 reps - 3-5 hold - Seated Scapular Retraction  - 2 x daily - 7 x weekly - 1 sets - 10 reps - 3-5 hold - Seated Transversus Abdominis Bracing  - 5 x daily - 7 x weekly - 1 sets - 10 reps - 5 hold - Standing Lumbar Extension  - 3 x daily - 7 x weekly - 1 sets - 5-10 reps - 3 hold   Access Code: [email or web address] URL: [email or web address] 12/19/23 - Seated Hip Flexor Stretch  - 2 x daily - 7 x weekly - 3 reps - 30 hold - Seated March  - 2 x daily - 7 x weekly - 2 sets - 10 reps - Seated Abdominal Press into [employer]  - 2 x daily - 7 x weekly - 2 sets - 10 reps - 3 hold  Date: 11/28/2023 Prepared by: Augustin Mclean  Exercises - Supine Transversus Abdominis Bracing -  Hands on Stomach  - 2 x daily - 7 x weekly - 2 sets - 10 reps - 5 hold - Hooklying Gluteal Sets  - 2 x daily - 7 x weekly - 2 sets - 10 reps - 5 hold - Supine Bridge  - 2 x daily - 7 x weekly - 2 sets - 10 reps - 5 hold 12/17/23 Decompression with yellow t-band  12/22/23 - Supine Single Knee to Chest Stretch  - 2 x daily - 7 x weekly - 1 sets - 3 reps - 30 hold - Hooklying Active Hamstring Stretch  - 2 x daily - 7 x weekly - 1 sets - 3 reps - 30 hold - Hooklying Clamshell with Resistance  - 2 x daily - 7 x weekly - 1 sets - 10 reps - 3-5 hold -  Supine Bridge with Resistance Band  - 2 x daily - 7 x weekly - 1 sets - 10 reps - 3-5 hold ASSESSMENT:  CLINICAL IMPRESSION: Interventions today were geared towards pain centralization and core strengthening. Tolerated all activities without worsening of symptoms. Reported complete relief on radicular symptoms on prone exercises and femoral nerve glide. However, radicular symptoms came back when patient sat up and when patient stood up. Practiced walking with [facility] which provided relief from radicular symptoms. Reported of no pain on the knee and front of the thigh when walking with the cane. Demonstrated appropriate levels of fatigue. Pacing of activities was slow. Provided slight amount of cueing to ensure correct execution of activity with good carry-over. To date, skilled PT is required to address the impairments and improve function.   Eval: Patient is a 73 y.o. female who was seen today for physical therapy evaluation and treatment for lumbar radiculopathy.  Evaluation demonstrates decreased activity tolerance, decreased strength, increased pain and postural dysfunction.  Ms. Koenen will benefit from skilled PT to address these deficits and improve her functional ability.     OBJECTIVE IMPAIRMENTS: Abnormal gait, decreased activity tolerance, decreased mobility, difficulty walking, decreased ROM, decreased strength, and pain.   ACTIVITY  LIMITATIONS: carrying, lifting, bending, sitting, standing, squatting, sleeping, stairs, transfers, bathing, dressing, and locomotion level  PARTICIPATION LIMITATIONS: meal prep, cleaning, laundry, driving, shopping, and community activity  PERSONAL FACTORS: Age, Fitness, Time since onset of injury/illness/exacerbation, and 3+ comorbidities: OA, osteoporosis, 3 back surgeries.   are also affecting patient's functional outcome.   REHAB POTENTIAL: Good  CLINICAL DECISION MAKING: Evolving/moderate complexity  EVALUATION COMPLEXITY: Moderate   GOALS: Goals reviewed with patient? No  SHORT TERM GOALS: Target date: 12/19/23  PT to be I in HEP to decrease her back pain to no greater than a 6/10 to be waking only 2x a night Baseline: Goal status: on-going   2.  PT to be able to sit for 45 minutes without increased pain to eat a meal without increased  pain  Baseline:  Goal status: on-going   3.  Pt to be able to stand for 15 minutes in order to make a quick meal ie sandwich without increased pain  Baseline:  Goal status: on-going   4.  Pt to be able to walk for 30 minutes without increasing her pain for shopping tasks.  Baseline:  Goal status: on-going   5.  Radicular sx to be above knee level  Baseline:  Goal status: on-going     LONG TERM GOALS: Target date: 01/16/24  PT to be I in HEP to decrease her back pain to no greater than a 3/10 to be waking only 2x a night Baseline:  Goal status: on-going   2.  PT to be able to sit for 60 minutes without increased pain to eat a meal without increased  pain Baseline:  Goal status: on-going   3.   Pt to be able to stand for 30 minutes in order to make  meal  Baseline:  Goal status:on-going   4.  Pt to be able to walk for 45 minutes without increasing her pain for shopping tasks.  Baseline:  Goal status: on-going   5.  PT to increase her core and LE strength to be able to go up and down 4 steps in a reciprocal manner.   Baseline:  Goal status: on-going    PLAN:  PT FREQUENCY: 2x/week  PT DURATION: 6 weeks  PLANNED INTERVENTIONS: 97110-Therapeutic exercises, 97530-  Therapeutic activity, [id number]- Neuromuscular re-education, [phone]- Self Care, 02859- Manual therapy, and Patient/Family education.  PLAN FOR NEXT SESSION:continue to progress core strengthening exercises   Vinie L. Izek Corvino, PT, DPT, OCS Board-Certified Clinical Specialist in Orthopedic PT PT Compact Privilege # ([place]): [id number] T 01/09/2024, 12:29 PM

## 2024-01-14 ENCOUNTER — Encounter (HOSPITAL_COMMUNITY): Payer: Self-pay

## 2024-01-14 ENCOUNTER — Ambulatory Visit (HOSPITAL_COMMUNITY): Payer: Medicare Other

## 2024-01-14 DIAGNOSIS — M5416 Radiculopathy, lumbar region: Secondary | ICD-10-CM

## 2024-01-14 DIAGNOSIS — M546 Pain in thoracic spine: Secondary | ICD-10-CM | POA: Diagnosis not present

## 2024-01-14 DIAGNOSIS — M6281 Muscle weakness (generalized): Secondary | ICD-10-CM | POA: Diagnosis not present

## 2024-01-14 NOTE — Therapy (Signed)
OUTPATIENT PHYSICAL THERAPY THORACOLUMBAR TREATMENT   Patient Name: Morgan Rowland MRN: [id number] DOB:1951-09-24, 73 y.o., female Today's Date: 01/14/2024  END OF SESSION: END OF SESSION:   PT End of Session - 01/14/24 1253     Visit Number 8    Number of Visits 12    Date for PT Re-Evaluation 01/16/24    Authorization Type BCBS medicare    Progress Note Due on Visit 10    PT Start Time 1304    PT Stop Time 1343    PT Time Calculation (min) 39 min    Activity Tolerance Patient tolerated treatment well    Behavior During Therapy [facility] for tasks assessed/performed                Past Medical History:  Diagnosis Date   Atypical mole 08/14/2010   left shin tx mohs (atypical prolif.)   Atypical nevi 08/28/2010   left heel mild/mod. no tx   Cervical disc disease    Chronic pain syndrome    Constipation 04/21/2017   Diverticulitis    External hemorrhoids    GERD (gastroesophageal reflux disease)    Hemangioma    cervical; MRI 01/2019 Dr. Trey Sailors   High cholesterol    Hypercholesteremia    IBS (irritable bowel syndrome)    Lumbar spondylosis with myelopathy    Lumbosacral pain, chronic    Numbness and tingling of right arm    Osteoporosis    Parotid tumor    Past Surgical History:  Procedure Laterality Date   APPENDECTOMY     CAROTID ENDARTERECTOMY     CHOLECYSTECTOMY     COLONOSCOPY  2011   Dr Karilyn Cota in [place]   KNEE SURGERY     Left 12/2016, 08/2017   Spinal fusion x 2     TONSILLECTOMY     TOTAL ABDOMINAL HYSTERECTOMY     Patient Active Problem List   Diagnosis Date Noted   Constipation 04/21/2017   Diverticulitis 03/02/2012   High cholesterol 03/02/2012    PCP: [facility]  REFERRING PROVIDER: Bedelia Person, MD  REFERRING DIAG: lumbar back pain  with radiculopathy affecting left lower extremity  Rationale for Evaluation and Treatment: Rehabilitation  THERAPY DIAG:  Lumbar radiculopathy Muscle weakness   ONSET DATE: chronic    SUBJECTIVE:                                                                                                                                                                                           SUBJECTIVE STATEMENT:  Pt stated she can tell improvements, pain is low.  Able to stand for 15  minutes with pain upon sitting and radicular symptoms coming around front of thigh ending at knee.  Exercises are going well at home.  Reports she has been using SPC some that helps  PERTINENT HISTORY:  Patient fell in April of 2023 resulting in a T12 compression fracture. Reports chronic back pain prior to this with hx of lumbar fusion x2.  Symptoms worse with prolonged standing, had some radicular symptoms into BIL LES prior to fall but worse since, had PT which helped temporarily but now she is having more pain down her left leg.  PAIN:  Are you having pain? Yes: NPRS scale: 4/10; Worst pain 9/10, Best 0/10 Pain location: Back and leg pain;  Rt radiculopathy to mid calf, Lt  radiculopathy to right above ankle Pain description: grabbing  Aggravating factors: unknown Relieving factors: prop pillow under her legs   PRECAUTIONS: None  RED FLAGS: None   WEIGHT BEARING RESTRICTIONS: No  FALLS:  Has patient fallen in last 6 months? No  LIVING ENVIRONMENT: Lives with: lives with their spouse Lives in: House/apartment Stairs: Yes: External: 4 steps; on right going up can not go up reciprocally  Has following equipment at home: Walker - 2 wheeled  OCCUPATION: retired   PLOF: Independent  PATIENT GOALS: less pain to be able to do more   NEXT MD VISIT: not scheduled until the MRI   OBJECTIVE:  Note: Objective measures were completed at Evaluation unless otherwise noted.  DIAGNOSTIC FINDINGS: IMPRESSION: 1. Subacute T12 vertebral body compression fracture with approximately 60% height loss and 4 mm of retropulsion of the superior posterior margin of the T12 vertebral body. 2. At L3-4  there is a broad-based disc bulge with a small central disc protrusion. Moderate bilateral facet arthropathy with bilateral facet effusions. Moderate-severe spinal stenosis. Mild bilateral foraminal stenosis. 3. Posterior lumbar interbody fusion and laminectomy at L4-5 without foraminal or central canal stenosis. 4. At L5-S1 there is a mild broad-based disc bulge eccentric towards the left. Mild left foraminal stenosis.     Electronically Signed   By: Elige Ko M.D.   On: 06/13/2022 09:48 A comperassin MRI is Scheduled Jan 16   PATIENT SURVEYS:  FOTO 37  COGNITION: Overall cognitive status: Within functional limits for tasks assessed    POSTURE: rounded shoulders, forward head, decreased lumbar lordosis, and increased thoracic kyphosis  PALPATION: Tight paravertebral mm  LUMBAR ROM:   AROM eval  Flexion   Extension 17  Right lateral flexion   Left lateral flexion   Right rotation   Left rotation    (Blank rows = not tested)   LOWER EXTREMITY MMT:    MMT Right eval Left eval  Hip flexion 4 4  Hip extension 2 2  Hip abduction 4+ 3  Hip adduction    Hip internal rotation    Hip external rotation    Knee flexion 3 3  Knee extension 4+ 5  Ankle dorsiflexion 3+ 4-  Ankle plantarflexion    Ankle inversion    Ankle eversion     (Blank rows = not tested)   FUNCTIONAL TESTS:  30 seconds chair stand test:  7 x Poor for age and sex  2 minute walk test: 11/28/23:  432ft no AD Single leg stance: 11/28/23:  Rt 25" max, Lt 29" max of 3  TREATMENT DATE:  01/14/24   Minisquat 10 Marching 10x  Sidestep GTB 2RT down hallway Tandem stance 2x 30" Vector stance 3x 5" with HHA Heel raise 10x 3"  Sidelying: Abduction 10x  Supine: dead bug 10x with ab set  Prone: SLR 2 sets 4 reps POE 2   Seated:  Hamstring stretch 2x 30" Piriformis stretch 2x 30"  01/09/24 L femoral nerve floss in prone x 1' POE x 5" x 10 x 2 Seated alt knee ext, neutral spine x 10 x  2 Seated abdominal press with physioball, neutral spine x 3" x 10 x 2 Seated alt UE lifts, neutral spine x 10 x 2 Gait training with SPC, CGA-SBA ~ 50 ft using modified 2-point/2-point pattern Education on proper cane height and adjustment - patient gave excellent verbal understanding  01/01/24 Wall arch x 10 Knee to chest x 30" x 3  Hamstring stretch with nerve floss x 3  Ab/glut set combination x 10  Clam with resistance x 10 Bridge with resistance x 10  Bent knee raise with resistance x 10  Side step with resistance x 10 Mini squat with resistance x 10  Heel raise x 10  Nustep level 2 x 5:00  12/24/23 supine:  HMP with exercises to decrease pain Diaphragm breathing with instructions on how this affects the parasympathetic nervous sx.   Combination glut set and ab set x 10 Scapular retraction x 10  Knee to chest with  towel 30"x 3 B  Active hamstring stretch x 3  Dead bug x 10 Bridge with hip adduction x 10  Standing: Mini squat x 5 Side stepping x 2 RT Heel raise x 10     PATIENT EDUCATION:  Education details: posture, HEP Person educated: Patient Education method: Explanation and Handouts Education comprehension: verbalized understanding  HOME EXERCISE PROGRAM:   Access Code: [email or web address] URL: [email or web address] 01/09/24 - Prone Femoral Nerve Mobilization  - 1-2 x daily - 7 x weekly - Static Prone on Elbows  - 1-2 x daily - 7 x weekly - 2 sets - 10 reps - 5 hold - Seated Abdominal Press into [employer]  - 1-2 x daily - 7 x weekly - 2 sets - 10 reps - 3 hold  Date: 11/21/2023 Prepared by: Virgina Organ  Exercises - Correct Seated Posture  - 3 x daily - 7 x weekly - 1 sets - 5 reps - 3-5" hold - Seated Scapular Retraction  - 2 x daily - 7 x weekly - 1 sets - 10 reps - 3-5" hold - Seated Transversus Abdominis Bracing  - 5 x daily - 7 x weekly - 1 sets - 10 reps - 5" hold - Standing Lumbar Extension  - 3 x daily - 7 x weekly - 1 sets - 5-10 reps -  3" hold   Access Code: [email or web address] URL: [email or web address] 12/19/23 - Seated Hip Flexor Stretch  - 2 x daily - 7 x weekly - 3 reps - 30 hold - Seated March  - 2 x daily - 7 x weekly - 2 sets - 10 reps - Seated Abdominal Press into [employer]  - 2 x daily - 7 x weekly - 2 sets - 10 reps - 3 hold  Date: 11/28/2023 Prepared by: Becky Sax  Exercises - Supine Transversus Abdominis Bracing - Hands on Stomach  - 2 x daily - 7 x weekly - 2 sets - 10 reps - 5" hold - Hooklying Gluteal Sets  - 2 x daily - 7 x weekly - 2 sets - 10 reps - 5" hold - Supine Bridge  - 2 x daily - 7 x weekly - 2 sets - 10 reps -  5" hold 12/17/23 Decompression with yellow t-band  12/22/23 - Supine Single Knee to Chest Stretch  - 2 x daily - 7 x weekly - 1 sets - 3 reps - 30" hold - Hooklying Active Hamstring Stretch  - 2 x daily - 7 x weekly - 1 sets - 3 reps - 30" hold - Hooklying Clamshell with Resistance  - 2 x daily - 7 x weekly - 1 sets - 10 reps - 3-5" hold - Supine Bridge with Resistance Band  - 2 x daily - 7 x weekly - 1 sets - 10 reps - 3-5" hold ASSESSMENT:  CLINICAL IMPRESSION: Session focus with core stability and proximal strengthening.  Added prone and sidelying SLR for gluteal strengthening, presents with weak mm but good form and tolerance.  Pt with initial radicular symptoms when initially sitting with no further c/o of radicular symptoms through session.  Minimal amount of cueing required this session for form with good follow thru.  Pt presents with good posture through session.    Eval: Patient is a 73 y.o. female who was seen today for physical therapy evaluation and treatment for lumbar radiculopathy.  Evaluation demonstrates decreased activity tolerance, decreased strength, increased pain and postural dysfunction.  Ms. Lave will benefit from skilled PT to address these deficits and improve her functional ability.     OBJECTIVE IMPAIRMENTS: Abnormal gait, decreased activity  tolerance, decreased mobility, difficulty walking, decreased ROM, decreased strength, and pain.   ACTIVITY LIMITATIONS: carrying, lifting, bending, sitting, standing, squatting, sleeping, stairs, transfers, bathing, dressing, and locomotion level  PARTICIPATION LIMITATIONS: meal prep, cleaning, laundry, driving, shopping, and community activity  PERSONAL FACTORS: Age, Fitness, Time since onset of injury/illness/exacerbation, and 3+ comorbidities: OA, osteoporosis, 3 back surgeries.   are also affecting patient's functional outcome.   REHAB POTENTIAL: Good  CLINICAL DECISION MAKING: Evolving/moderate complexity  EVALUATION COMPLEXITY: Moderate   GOALS: Goals reviewed with patient? No  SHORT TERM GOALS: Target date: 12/19/23  PT to be I in HEP to decrease her back pain to no greater than a 6/10 to be waking only 2x a night Baseline: Goal status: on-going   2.  PT to be able to sit for 45 minutes without increased pain to eat a meal without increased  pain  Baseline:  Goal status: on-going   3.  Pt to be able to stand for 15 minutes in order to make a quick meal ie sandwich without increased pain  Baseline:  Goal status: on-going   4.  Pt to be able to walk for 30 minutes without increasing her pain for shopping tasks.  Baseline:  Goal status: on-going   5.  Radicular sx to be above knee level  Baseline:  Goal status: on-going     LONG TERM GOALS: Target date: 01/16/24  PT to be I in HEP to decrease her back pain to no greater than a 3/10 to be waking only 2x a night Baseline:  Goal status: on-going   2.  PT to be able to sit for 60 minutes without increased pain to eat a meal without increased  pain Baseline:  Goal status: on-going   3.   Pt to be able to stand for 30 minutes in order to make  meal  Baseline:  Goal status:on-going   4.  Pt to be able to walk for 45 minutes without increasing her pain for shopping tasks.  Baseline:  Goal status: on-going   5.   PT to increase her core and LE  strength to be able to go up and down 4 steps in a reciprocal manner.  Baseline:  Goal status: on-going    PLAN:  PT FREQUENCY: 2x/week  PT DURATION: 6 weeks  PLANNED INTERVENTIONS: 97110-Therapeutic exercises, 97530- Therapeutic activity, [id number]- Neuromuscular re-education, 97535- Self Care, 16109- Manual therapy, and Patient/Family education.  PLAN FOR NEXT SESSION:continue to progress core strengthening exercises   Becky Sax, LPTA/CLT; [facility] [phone]  01/14/2024

## 2024-01-16 ENCOUNTER — Ambulatory Visit (HOSPITAL_COMMUNITY): Payer: Medicare Other

## 2024-01-16 DIAGNOSIS — M5416 Radiculopathy, lumbar region: Secondary | ICD-10-CM

## 2024-01-16 DIAGNOSIS — M6281 Muscle weakness (generalized): Secondary | ICD-10-CM

## 2024-01-16 DIAGNOSIS — M546 Pain in thoracic spine: Secondary | ICD-10-CM

## 2024-01-16 NOTE — Therapy (Signed)
OUTPATIENT PHYSICAL THERAPY THORACOLUMBAR PROGRESS/DISCHARGE NOTE   Patient Name: Morgan Rowland MRN: [id number] DOB:06/04/1951, 73 y.o., female Today's Date: 01/16/2024  END OF SESSION:   PT End of Session - 01/16/24 1232     Visit Number 9    Number of Visits 12    Date for PT Re-Evaluation 01/16/24    Authorization Type [employer] medicare    PT Start Time 1150    PT Stop Time 1230    PT Time Calculation (min) 40 min    Equipment Utilized During Treatment Gait belt    Activity Tolerance Patient tolerated treatment well    Behavior During Therapy WFL for tasks assessed/performed                 Past Medical History:  Diagnosis Date   Atypical mole 08/14/2010   left shin tx mohs (atypical prolif.)   Atypical nevi 08/28/2010   left heel mild/mod. no tx   Cervical disc disease    Chronic pain syndrome    Constipation 04/21/2017   Diverticulitis    External hemorrhoids    GERD (gastroesophageal reflux disease)    Hemangioma    cervical; MRI 01/2019 Dr. Trey Sailors   High cholesterol    Hypercholesteremia    IBS (irritable bowel syndrome)    Lumbar spondylosis with myelopathy    Lumbosacral pain, chronic    Numbness and tingling of right arm    Osteoporosis    Parotid tumor    Past Surgical History:  Procedure Laterality Date   APPENDECTOMY     CAROTID ENDARTERECTOMY     CHOLECYSTECTOMY     COLONOSCOPY  2011   Dr Karilyn Cota in [place]   KNEE SURGERY     Left 12/2016, 08/2017   Spinal fusion x 2     TONSILLECTOMY     TOTAL ABDOMINAL HYSTERECTOMY     Patient Active Problem List   Diagnosis Date Noted   Constipation 04/21/2017   Diverticulitis 03/02/2012   High cholesterol 03/02/2012   Progress Note Reporting Period 11/21/23 to 01/16/24  See note below for Objective Data and Assessment of Progress/Goals.   PCP: [facility]  REFERRING PROVIDER: Bedelia Person, MD  REFERRING DIAG: lumbar back pain  with radiculopathy affecting left lower  extremity  Rationale for Evaluation and Treatment: Rehabilitation  THERAPY DIAG:  Lumbar radiculopathy Muscle weakness   ONSET DATE: chronic   SUBJECTIVE:  SUBJECTIVE STATEMENT:  PROGRESS NOTE 01/16/24: Patient was hurting more after the last session and states that the shooting pain in front of the thigh was worse.However, pain is at a 3/10. Denies shooting pain down to the ankle and the radicular symptom is only up to the knee. Patient is not waking up at night with pain. Patient thinks that PT has helped her so far and she is around 75% better because she can do more now with less pain. Patient is able to sit > 45 minutes (e.g church service) without increased pain. However, patient can only stand and walk fir 20 minutes (with pain = 5/10). Prone exercises are helping with the shooting pain on the leg. Patient thinks that she needs to continue with PT and finish her session.  PERTINENT HISTORY:  Patient fell in April of 2023 resulting in a T12 compression fracture. Reports chronic back pain prior to this with hx of lumbar fusion x2.  Symptoms worse with prolonged standing, had some radicular symptoms into BIL LES prior to fall but worse since, had PT which helped temporarily but now she is having more pain down her left leg.  PAIN:  Are you having pain? Yes: NPRS scale: 4/10; Worst pain 9/10, Best 0/10 Pain location: Back and leg pain;  Rt radiculopathy to mid calf, Lt  radiculopathy to right above ankle Pain description: grabbing  Aggravating factors: unknown Relieving factors: prop pillow under her legs   PRECAUTIONS: None  RED FLAGS: None   WEIGHT BEARING RESTRICTIONS: No  FALLS:  Has patient fallen in last 6 months? No  LIVING ENVIRONMENT: Lives with: lives with their spouse Lives in:  House/apartment Stairs: Yes: External: 4 steps; on right going up can not go up reciprocally  Has following equipment at home: Walker - 2 wheeled  OCCUPATION: retired   PLOF: Independent  PATIENT GOALS: less pain to be able to do more   NEXT MD VISIT: not scheduled until the MRI   OBJECTIVE:  Note: Objective measures were completed at Evaluation unless otherwise noted.  DIAGNOSTIC FINDINGS: IMPRESSION: 1. Subacute T12 vertebral body compression fracture with approximately 60% height loss and 4 mm of retropulsion of the superior posterior margin of the T12 vertebral body. 2. At L3-4 there is a broad-based disc bulge with a small central disc protrusion. Moderate bilateral facet arthropathy with bilateral facet effusions. Moderate-severe spinal stenosis. Mild bilateral foraminal stenosis. 3. Posterior lumbar interbody fusion and laminectomy at L4-5 without foraminal or central canal stenosis. 4. At L5-S1 there is a mild broad-based disc bulge eccentric towards the left. Mild left foraminal stenosis.     Electronically Signed   By: Elige Ko M.D.   On: 06/13/2022 09:48 A comperassin MRI is Scheduled Jan 16   PATIENT SURVEYS:  FOTO 01/16/24: 52.74 from 37  COGNITION: Overall cognitive status: Within functional limits for tasks assessed    POSTURE: rounded shoulders, forward head, decreased lumbar lordosis, and increased thoracic kyphosis  PALPATION: Tight paravertebral mm  LUMBAR ROM:   AROM eval  Flexion   Extension 17  Right lateral flexion   Left lateral flexion   Right rotation   Left rotation    (Blank rows = not tested)   LOWER EXTREMITY MMT:    MMT Right eval Left eval Right 01/16/24 Left 01/16/24  Hip flexion 4 4 4+ 4+  Hip extension 2 2 4+ 4+  Hip abduction 4+ 3 4+ 4+  Hip adduction      Hip internal rotation  Hip external rotation      Knee flexion 3 3 4+ 4+  Knee extension 4+ 5 4+ 4+  Ankle dorsiflexion 3+ 4- 4+ 4+  Ankle  plantarflexion   4+ 4+  Ankle inversion      Ankle eversion       (Blank rows = not tested)   FUNCTIONAL TESTS:  30 seconds chair stand test: 01/16/24 14x from  7 x Poor for age and sex  2 minute walk test: 01/16/24: 452 ft no AD  from 11/28/23: 47ft no AD Single leg stance  Rt 36.6" from 25" max, Lt 52.60" from 29" max of 3 01/16/24: Patient is able to negotiate 4-step stairs (7") with alternating pattern and use of B railings independently  TREATMENT DATE:  01/16/24 Progress note ( , SLS, 30 sec chair stand test, MMT, FOTO)  01/14/24   Minisquat 10 Marching 10x  Sidestep GTB 2RT down hallway Tandem stance 2x 30" Vector stance 3x 5" with HHA Heel raise 10x 3"   Sidelying: Abduction 10x  Supine: dead bug 10x with ab set  Prone: SLR 2 sets 4 reps POE 2   Seated:  Hamstring stretch 2x 30" Piriformis stretch 2x 30"  01/09/24 L femoral nerve floss in prone x 1' POE x 5" x 10 x 2 Seated alt knee ext, neutral spine x 10 x 2 Seated abdominal press with physioball, neutral spine x 3" x 10 x 2 Seated alt UE lifts, neutral spine x 10 x 2 Gait training with SPC, CGA-SBA ~ 50 ft using modified 2-point/2-point pattern Education on proper cane height and adjustment - patient gave excellent verbal understanding  01/01/24 Wall arch x 10 Knee to chest x 30" x 3  Hamstring stretch with nerve floss x 3  Ab/glut set combination x 10  Clam with resistance x 10 Bridge with resistance x 10  Bent knee raise with resistance x 10  Side step with resistance x 10 Mini squat with resistance x 10  Heel raise x 10  Nustep level 2 x 5:00  12/24/23 supine:  HMP with exercises to decrease pain Diaphragm breathing with instructions on how this affects the parasympathetic nervous sx.   Combination glut set and ab set x 10 Scapular retraction x 10  Knee to chest with  towel 30"x 3 B  Active hamstring stretch x 3  Dead bug x 10 Bridge with hip adduction x 10  Standing: Mini squat x 5 Side  stepping x 2 RT Heel raise x 10     PATIENT EDUCATION:  Education details: posture, HEP Person educated: Patient Education method: Explanation and Handouts Education comprehension: verbalized understanding  HOME EXERCISE PROGRAM:   Access Code: [email or web address] URL: [email or web address] 01/09/24 - Prone Femoral Nerve Mobilization  - 1-2 x daily - 7 x weekly - Static Prone on Elbows  - 1-2 x daily - 7 x weekly - 2 sets - 10 reps - 5 hold - Seated Abdominal Press into [employer]  - 1-2 x daily - 7 x weekly - 2 sets - 10 reps - 3 hold  Date: 11/21/2023 Prepared by: Virgina Organ  Exercises - Correct Seated Posture  - 3 x daily - 7 x weekly - 1 sets - 5 reps - 3-5" hold - Seated Scapular Retraction  - 2 x daily - 7 x weekly - 1 sets - 10 reps - 3-5" hold - Seated Transversus Abdominis Bracing  - 5 x daily - 7 x weekly - 1 sets - 10 reps -  5" hold - Standing Lumbar Extension  - 3 x daily - 7 x weekly - 1 sets - 5-10 reps - 3" hold   Access Code: [email or web address] URL: [email or web address] 12/19/23 - Seated Hip Flexor Stretch  - 2 x daily - 7 x weekly - 3 reps - 30 hold - Seated March  - 2 x daily - 7 x weekly - 2 sets - 10 reps - Seated Abdominal Press into [employer]  - 2 x daily - 7 x weekly - 2 sets - 10 reps - 3 hold  Date: 11/28/2023 Prepared by: Becky Sax  Exercises - Supine Transversus Abdominis Bracing - Hands on Stomach  - 2 x daily - 7 x weekly - 2 sets - 10 reps - 5" hold - Hooklying Gluteal Sets  - 2 x daily - 7 x weekly - 2 sets - 10 reps - 5" hold - Supine Bridge  - 2 x daily - 7 x weekly - 2 sets - 10 reps - 5" hold 12/17/23 Decompression with yellow t-band  12/22/23 - Supine Single Knee to Chest Stretch  - 2 x daily - 7 x weekly - 1 sets - 3 reps - 30" hold - Hooklying Active Hamstring Stretch  - 2 x daily - 7 x weekly - 1 sets - 3 reps - 30" hold - Hooklying Clamshell with Resistance  - 2 x daily - 7 x weekly - 1 sets - 10 reps - 3-5"  hold - Supine Bridge with Resistance Band  - 2 x daily - 7 x weekly - 1 sets - 10 reps - 3-5" hold ASSESSMENT:  CLINICAL IMPRESSION:  PROGRESS NOTE 01/16/24: Patient demonstrated continued improvements in function as indicated by positive significant changes in FOTO, 30 sec chair stand test, SLS, and . Strength has also significantly improved and the radicular symptoms have centralized just above the knee. Due to the severity of the condition, being able tolerate prolonged periods of standing and walking > 30-45 minutes maybe unrealistic at this time for the patient. Patient is scheduled to have back surgery. With this, skilled PT is not required at this time and patient is D/C from skilled PT. Patient may just continue her HEP on daily basis to facilitate carry-over of the gains in PT until she is ready for surgery. After careful discussion with the patient she agrees to be d/c because she is also satisfied with her current functional level. Patient gave excellent verbal understanding to continue with her HEP.   Eval: Patient is a 73 y.o. female who was seen today for physical therapy evaluation and treatment for lumbar radiculopathy.  Evaluation demonstrates decreased activity tolerance, decreased strength, increased pain and postural dysfunction.  Ms. Asberry will benefit from skilled PT to address these deficits and improve her functional ability.     OBJECTIVE IMPAIRMENTS: decreased strength and pain.   ACTIVITY LIMITATIONS: carrying, lifting, bending, standing, and squatting  PARTICIPATION LIMITATIONS: meal prep, cleaning, laundry, driving, shopping, and community activity  PERSONAL FACTORS: Age, Fitness, Time since onset of injury/illness/exacerbation, and 3+ comorbidities: OA, osteoporosis, 3 back surgeries.   are also affecting patient's functional outcome.   REHAB POTENTIAL: Fair    GOALS: Goals reviewed with patient? No  SHORT TERM GOALS: Target date: 12/19/23  PT to be I in HEP  to decrease her back pain to no greater than a 6/10 to be waking only 2x a night Baseline: Goal status: MET  2.  PT to be able to sit for 45 minutes  without increased pain to eat a meal without increased  pain  Baseline:  Goal status: MET  3.  Pt to be able to stand for 15 minutes in order to make a quick meal ie sandwich without increased pain  Baseline:  Goal status: MET  4.  Pt to be able to walk for 30 minutes without increasing her pain for shopping tasks.  Baseline:  Goal status: NOT MET  5.  Radicular sx to be above knee level  Baseline:  Goal status: MET     LONG TERM GOALS: Target date: 01/16/24  PT to be I in HEP to decrease her back pain to no greater than a 3/10 to be waking only 2x a night Baseline:  Goal status: MET  2.  PT to be able to sit for 60 minutes without increased pain to eat a meal without increased  pain Baseline:  Goal status: MET  3.   Pt to be able to stand for 30 minutes in order to make  meal  Baseline:  Goal status:NOT MET  4.  Pt to be able to walk for 45 minutes without increasing her pain for shopping tasks.  Baseline:  Goal status: NOT MET  5.  PT to increase her core and LE strength to be able to go up and down 4 steps in a reciprocal manner.  Baseline:  Goal status: MET   PLAN:  PT FREQUENCY:  0  PT DURATION: other: 0  PLANNED INTERVENTIONS:  D/C from skilled PT. D/C to independent HEP .    Tish Frederickson. Cash [place]ow, PT, DPT, OCS Board-Certified Clinical Specialist in Orthopedic PT PT Compact Privilege # ([place]): [id number] T 01/16/2024 1:30 PM  PHYSICAL THERAPY DISCHARGE SUMMARY  Visits from Start of Care: 9  Current functional level related to goals / functional outcomes: See above   Remaining deficits: See above   Education / Equipment: See above   Patient agrees to discharge. Patient goals were partially met. Patient is being discharged due to being pleased with the current functional level.  Tish Frederickson. Lateia Fraser, PT,  DPT, OCS Board-Certified Clinical Specialist in Orthopedic PT PT Compact Privilege # ([place]): [phone] T 01/16/2024 1:30 PM

## 2024-01-19 DIAGNOSIS — E6609 Other obesity due to excess calories: Secondary | ICD-10-CM | POA: Diagnosis not present

## 2024-01-19 DIAGNOSIS — M545 Low back pain, unspecified: Secondary | ICD-10-CM | POA: Diagnosis not present

## 2024-01-19 DIAGNOSIS — Z79899 Other long term (current) drug therapy: Secondary | ICD-10-CM | POA: Diagnosis not present

## 2024-01-19 DIAGNOSIS — G8929 Other chronic pain: Secondary | ICD-10-CM | POA: Diagnosis not present

## 2024-01-19 DIAGNOSIS — M4716 Other spondylosis with myelopathy, lumbar region: Secondary | ICD-10-CM | POA: Diagnosis not present

## 2024-01-19 DIAGNOSIS — Z6832 Body mass index (BMI) 32.0-32.9, adult: Secondary | ICD-10-CM | POA: Diagnosis not present

## 2024-01-20 ENCOUNTER — Other Ambulatory Visit: Payer: Self-pay | Admitting: Neurosurgery

## 2024-01-21 ENCOUNTER — Encounter (HOSPITAL_COMMUNITY): Payer: Medicare Other

## 2024-01-21 DIAGNOSIS — Z79899 Other long term (current) drug therapy: Secondary | ICD-10-CM | POA: Diagnosis not present

## 2024-01-23 ENCOUNTER — Encounter (HOSPITAL_COMMUNITY): Payer: Medicare Other | Admitting: Physical Therapy

## 2024-01-26 ENCOUNTER — Other Ambulatory Visit: Payer: Self-pay | Admitting: Neurosurgery

## 2024-01-27 ENCOUNTER — Encounter (HOSPITAL_COMMUNITY): Payer: Self-pay

## 2024-01-27 ENCOUNTER — Other Ambulatory Visit: Payer: Self-pay | Admitting: Neurosurgery

## 2024-01-27 NOTE — Pre-Procedure Instructions (Signed)
 Surgical Instructions   Your procedure is scheduled on Tuesday, March 4th. Report to [facility] Main Entrance "A" at 08:00 A.M., then check in with the Admitting office. Any questions or running late day of surgery: call [phone]  Questions prior to your surgery date: call [phone], Monday-Friday, 8am-4pm. If you experience any cold or flu symptoms such as cough, fever, chills, shortness of breath, etc. between now and your scheduled surgery, please notify us at the above number.     Remember:  Do not eat or drink after midnight the night before your surgery    Take these medicines the morning of surgery with A SIP OF WATER  atorvastatin (LIPITOR)  baclofen (LIORESAL)  omeprazole (PRILOSEC)  raloxifene (EVISTA)   May take these medicines IF NEEDED: HYDROcodone-acetaminophen (NORCO)  valACYclovir (VALTREX)    One week prior to surgery, STOP taking any Aspirin (unless otherwise instructed by your surgeon) Aleve, Naproxen, Ibuprofen, Motrin, Advil, Goody's, BC's, all herbal medications, fish oil, and non-prescription vitamins.                     Do NOT Smoke (Tobacco/Vaping) for 24 hours prior to your procedure.  If you use a CPAP at night, you may bring your mask/headgear for your overnight stay.   You will be asked to remove any contacts, glasses, piercing's, hearing aid's, dentures/partials prior to surgery. Please bring cases for these items if needed.    Patients discharged the day of surgery will not be allowed to drive home, and someone needs to stay with them for 24 hours.  SURGICAL WAITING ROOM VISITATION Patients may have no more than 2 support people in the waiting area - these visitors may rotate.   Pre-op nurse will coordinate an appropriate time for 1 ADULT support person, who may not rotate, to accompany patient in pre-op.  Children under the age of 30 must have an adult with them who is not the patient and must remain in the main waiting area with an  adult.  If the patient needs to stay at the hospital during part of their recovery, the visitor guidelines for inpatient rooms apply.  Please refer to the [facility] website for the visitor guidelines for any additional information.   If you received a COVID test during your pre-op visit  it is requested that you wear a mask when out in public, stay away from anyone that may not be feeling well and notify your surgeon if you develop symptoms. If you have been in contact with anyone that has tested positive in the last 10 days please notify you surgeon.      Pre-operative 5 CHG Bathing Instructions   You can play a key role in reducing the risk of infection after surgery. Your skin needs to be as free of germs as possible. You can reduce the number of germs on your skin by washing with CHG (chlorhexidine gluconate) soap before surgery. CHG is an antiseptic soap that kills germs and continues to kill germs even after washing.   DO NOT use if you have an allergy to chlorhexidine/CHG or antibacterial soaps. If your skin becomes reddened or irritated, stop using the CHG and notify one of our RNs at [phone].   Please shower with the CHG soap starting 4 days before surgery using the following schedule:     Please keep in mind the following:  DO NOT shave, including legs and underarms, starting the day of your first shower.   You  may shave your face at any point before/day of surgery.  Place clean sheets on your bed the day you start using CHG soap. Use a clean washcloth (not used since being washed) for each shower. DO NOT sleep with pets once you start using the CHG.   CHG Shower Instructions:  Wash your face and private area with normal soap. If you choose to wash your hair, wash first with your normal shampoo.  After you use shampoo/soap, rinse your hair and body thoroughly to remove shampoo/soap residue.  Turn the water OFF and apply about 3 tablespoons (45 ml) of CHG soap to a  CLEAN washcloth.  Apply CHG soap ONLY FROM YOUR NECK DOWN TO YOUR TOES (washing for 3-5 minutes)  DO NOT use CHG soap on face, private areas, open wounds, or sores.  Pay special attention to the area where your surgery is being performed.  If you are having back surgery, having someone wash your back for you may be helpful. Wait 2 minutes after CHG soap is applied, then you may rinse off the CHG soap.  Pat dry with a clean towel  Put on clean clothes/pajamas   If you choose to wear lotion, please use ONLY the CHG-compatible lotions that are listed below.  Additional instructions for the day of surgery: DO NOT APPLY any lotions, deodorants, cologne, or perfumes.   Do not bring valuables to the hospital. [facility] is not responsible for any belongings/valuables. Do not wear nail polish, gel polish, artificial nails, or any other type of covering on natural nails (fingers and toes) Do not wear jewelry or makeup Put on clean/comfortable clothes.  Please brush your teeth.  Ask your nurse before applying any prescription medications to the skin.     CHG Compatible Lotions   Aveeno Moisturizing lotion  Cetaphil Moisturizing Cream  Cetaphil Moisturizing Lotion  Clairol Herbal Essence Moisturizing Lotion, Dry Skin  Clairol Herbal Essence Moisturizing Lotion, Extra Dry Skin  Clairol Herbal Essence Moisturizing Lotion, Normal Skin  Curel Age Defying Therapeutic Moisturizing Lotion with Alpha Hydroxy  Curel Extreme Care Body Lotion  Curel Soothing Hands Moisturizing Hand Lotion  Curel Therapeutic Moisturizing Cream, Fragrance-Free  Curel Therapeutic Moisturizing Lotion, Fragrance-Free  Curel Therapeutic Moisturizing Lotion, Original Formula  Eucerin Daily Replenishing Lotion  Eucerin Dry Skin Therapy Plus Alpha Hydroxy Crme  Eucerin Dry Skin Therapy Plus Alpha Hydroxy Lotion  Eucerin Original Crme  Eucerin Original Lotion  Eucerin Plus Crme Eucerin Plus Lotion  Eucerin TriLipid  Replenishing Lotion  Keri Anti-Bacterial Hand Lotion  Keri Deep Conditioning Original Lotion Dry Skin Formula Softly Scented  Keri Deep Conditioning Original Lotion, Fragrance Free Sensitive Skin Formula  Keri Lotion Fast Absorbing Fragrance Free Sensitive Skin Formula  Keri Lotion Fast Absorbing Softly Scented Dry Skin Formula  Keri Original Lotion  Keri Skin Renewal Lotion Keri Silky Smooth Lotion  Keri Silky Smooth Sensitive Skin Lotion  Nivea Body Creamy Conditioning Oil  Nivea Body Extra Enriched Lotion  Nivea Body Original Lotion  Nivea Body Sheer Moisturizing Lotion Nivea Crme  Nivea Skin Firming Lotion  NutraDerm 30 Skin Lotion  NutraDerm Skin Lotion  NutraDerm Therapeutic Skin Cream  NutraDerm Therapeutic Skin Lotion  ProShield Protective Hand Cream  Provon moisturizing lotion  Please read over the following fact sheets that you were given.

## 2024-01-28 ENCOUNTER — Encounter (HOSPITAL_COMMUNITY): Payer: Medicare Other | Admitting: Physical Therapy

## 2024-01-28 ENCOUNTER — Other Ambulatory Visit: Payer: Self-pay

## 2024-01-28 ENCOUNTER — Encounter (HOSPITAL_COMMUNITY)
Admission: RE | Admit: 2024-01-28 | Discharge: 2024-01-28 | Discharge status: Home / Self Care | Payer: Medicare Other | Source: Ambulatory Visit | Attending: Neurosurgery

## 2024-01-28 ENCOUNTER — Encounter (HOSPITAL_COMMUNITY): Payer: Self-pay

## 2024-01-28 VITALS — BP 133/75 | HR 66 | Temp 98.3°F | Resp 18 | Ht 59.0 in | Wt 165.4 lb

## 2024-01-28 DIAGNOSIS — R9431 Abnormal electrocardiogram [ECG] [EKG]: Secondary | ICD-10-CM | POA: Insufficient documentation

## 2024-01-28 DIAGNOSIS — Z01810 Encounter for preprocedural cardiovascular examination: Secondary | ICD-10-CM | POA: Diagnosis not present

## 2024-01-28 DIAGNOSIS — Z01818 Encounter for other preprocedural examination: Secondary | ICD-10-CM | POA: Insufficient documentation

## 2024-01-28 DIAGNOSIS — Z01812 Encounter for preprocedural laboratory examination: Secondary | ICD-10-CM | POA: Diagnosis not present

## 2024-01-28 HISTORY — DX: Anxiety disorder, unspecified: F41.9

## 2024-01-28 HISTORY — DX: Malignant (primary) neoplasm, unspecified: C80.1

## 2024-01-28 HISTORY — DX: Pneumonia, unspecified organism: J18.9

## 2024-01-28 HISTORY — DX: Herpesviral infection, unspecified: B00.9

## 2024-01-28 HISTORY — DX: Essential (primary) hypertension: I10

## 2024-01-28 LAB — CBC
HCT: 39.4 % (ref 36.0–46.0)
Hemoglobin: 13.5 g/dL (ref 12.0–15.0)
MCH: 31.6 pg (ref 26.0–34.0)
MCHC: 34.3 g/dL (ref 30.0–36.0)
MCV: 92.3 fL (ref 80.0–100.0)
Platelets: 175 10*3/uL (ref 150–400)
RBC: 4.27 MIL/uL (ref 3.87–5.11)
RDW: 13.2 % (ref 11.5–15.5)
WBC: 4.3 10*3/uL (ref 4.0–10.5)
nRBC: 0 % (ref 0.0–0.2)

## 2024-01-28 LAB — TYPE AND SCREEN
ABO/RH(D): O POS
Antibody Screen: NEGATIVE

## 2024-01-28 LAB — BASIC METABOLIC PANEL
Anion gap: 7 (ref 5–15)
BUN: 9 mg/dL (ref 8–23)
CO2: 27 mmol/L (ref 22–32)
Calcium: 9.3 mg/dL (ref 8.9–10.3)
Chloride: 102 mmol/L (ref 98–111)
Creatinine, Ser: 0.71 mg/dL (ref 0.44–1.00)
GFR, Estimated: >60 mL/min (ref >60)
Glucose, Bld: 120 mg/dL — ABNORMAL HIGH (ref 70–99)
Potassium: 4.2 mmol/L (ref 3.5–5.1)
Sodium: 136 mmol/L (ref 135–145)

## 2024-01-28 LAB — SURGICAL PCR SCREEN
MRSA, PCR: NEGATIVE
Staphylococcus aureus: NEGATIVE

## 2024-01-28 NOTE — Progress Notes (Signed)
 PCP - Lorie Phenix, PA  Cardiologist - none  Chest x-ray - 02/01/22 CE EKG - 01/28/24 Stress Test - 04/17/11 ECHO - n/a Cardiac Cath - n/a  ICD Pacemaker/Loop - n/a  Sleep Study -  n/a  Diabetes - n/a  Aspirin and Blood Thinner Instructions:  n/a  NPO   Anesthesia review: No  STOP now taking any Aspirin (unless otherwise instructed by your surgeon), Aleve, Naproxen, Ibuprofen, Motrin, Advil, Goody's, BC's, all herbal medications, fish oil, and all vitamins.   Coronavirus Screening Do you have any of the following symptoms:  Cough yes/no: No Fever (>100.10F)  yes/no: No Runny nose yes/no: No Sore throat yes/no: No Difficulty breathing/shortness of breath  yes/no: No  Have you traveled in the last 14 days and where? yes/no: No  Patient verbalized understanding of instructions that were given to them at the PAT appointment. Patient was also instructed that they will need to review over the PAT instructions again at home before surgery.

## 2024-01-30 ENCOUNTER — Encounter (HOSPITAL_COMMUNITY): Payer: Medicare Other | Admitting: Physical Therapy

## 2024-02-02 NOTE — Anesthesia Preprocedure Evaluation (Signed)
 Anesthesia Evaluation  Patient identified by MRN, date of birth, ID band Patient awake    Reviewed: Allergy & Precautions, NPO status , Patient's Chart, lab work & pertinent test results  History of Anesthesia Complications Negative for: history of anesthetic complications  Airway Mallampati: II  TM Distance: >3 FB Neck ROM: Full    Dental  (+) Dental Advisory Given   Pulmonary neg pulmonary ROS   breath sounds clear to auscultation       Cardiovascular hypertension, Pt. on medications (-) angina  Rhythm:Regular Rate:Normal     Neuro/Psych   Anxiety     Chronic back pain: narcotics    GI/Hepatic Neg liver ROS,GERD  Medicated and Controlled,,  Endo/Other  BMI 33  Renal/GU negative Renal ROS     Musculoskeletal  (+) Arthritis ,    Abdominal   Peds  Hematology Hb 13.5, plt 175k   Anesthesia Other Findings H/o parotid cancer  Reproductive/Obstetrics                             Anesthesia Physical Anesthesia Plan  ASA: 3  Anesthesia Plan: General   Post-op Pain Management: Tylenol PO (pre-op)*   Induction: Intravenous  PONV Risk Score and Plan: 3 and Ondansetron, Dexamethasone and Treatment may vary due to age or medical condition  Airway Management Planned: Oral ETT  Additional Equipment: None  Intra-op Plan:   Post-operative Plan: Extubation in OR  Informed Consent: I have reviewed the patients History and Physical, chart, labs and discussed the procedure including the risks, benefits and alternatives for the proposed anesthesia with the patient or authorized representative who has indicated his/her understanding and acceptance.     Dental advisory given  Plan Discussed with: CRNA and Surgeon  Anesthesia Plan Comments:         Anesthesia Quick Evaluation

## 2024-02-03 ENCOUNTER — Encounter (HOSPITAL_COMMUNITY): Payer: Self-pay | Admitting: *Deleted

## 2024-02-03 ENCOUNTER — Inpatient Hospital Stay (HOSPITAL_COMMUNITY)
Admission: RE | Disposition: A | Discharge status: Home / Self Care | Payer: Self-pay | Source: Home / Self Care | Attending: Neurosurgery

## 2024-02-03 ENCOUNTER — Other Ambulatory Visit: Payer: Self-pay

## 2024-02-03 ENCOUNTER — Inpatient Hospital Stay (HOSPITAL_COMMUNITY): Payer: Self-pay | Admitting: Anesthesiology

## 2024-02-03 ENCOUNTER — Inpatient Hospital Stay (HOSPITAL_COMMUNITY)
Admission: RE | Admit: 2024-02-03 | Discharge: 2024-02-04 | DRG: 402 | Disposition: A | Discharge status: Home / Self Care | Payer: Medicare Other | Attending: Neurosurgery | Admitting: Neurosurgery

## 2024-02-03 ENCOUNTER — Inpatient Hospital Stay (HOSPITAL_COMMUNITY)

## 2024-02-03 DIAGNOSIS — M4316 Spondylolisthesis, lumbar region: Secondary | ICD-10-CM | POA: Diagnosis not present

## 2024-02-03 DIAGNOSIS — Z808 Family history of malignant neoplasm of other organs or systems: Secondary | ICD-10-CM | POA: Diagnosis not present

## 2024-02-03 DIAGNOSIS — I10 Essential (primary) hypertension: Secondary | ICD-10-CM | POA: Diagnosis not present

## 2024-02-03 DIAGNOSIS — Z882 Allergy status to sulfonamides status: Secondary | ICD-10-CM | POA: Diagnosis not present

## 2024-02-03 DIAGNOSIS — K219 Gastro-esophageal reflux disease without esophagitis: Secondary | ICD-10-CM | POA: Diagnosis present

## 2024-02-03 DIAGNOSIS — Z85858 Personal history of malignant neoplasm of other endocrine glands: Secondary | ICD-10-CM

## 2024-02-03 DIAGNOSIS — Z981 Arthrodesis status: Secondary | ICD-10-CM | POA: Diagnosis not present

## 2024-02-03 DIAGNOSIS — Z9049 Acquired absence of other specified parts of digestive tract: Secondary | ICD-10-CM | POA: Diagnosis not present

## 2024-02-03 DIAGNOSIS — M5116 Intervertebral disc disorders with radiculopathy, lumbar region: Principal | ICD-10-CM | POA: Diagnosis present

## 2024-02-03 DIAGNOSIS — Z881 Allergy status to other antibiotic agents status: Secondary | ICD-10-CM

## 2024-02-03 DIAGNOSIS — F419 Anxiety disorder, unspecified: Secondary | ICD-10-CM

## 2024-02-03 DIAGNOSIS — Z472 Encounter for removal of internal fixation device: Secondary | ICD-10-CM | POA: Diagnosis not present

## 2024-02-03 DIAGNOSIS — M5416 Radiculopathy, lumbar region: Secondary | ICD-10-CM | POA: Diagnosis not present

## 2024-02-03 DIAGNOSIS — Z79899 Other long term (current) drug therapy: Secondary | ICD-10-CM | POA: Diagnosis not present

## 2024-02-03 DIAGNOSIS — E7800 Pure hypercholesterolemia, unspecified: Secondary | ICD-10-CM | POA: Diagnosis present

## 2024-02-03 DIAGNOSIS — K5792 Diverticulitis of intestine, part unspecified, without perforation or abscess without bleeding: Secondary | ICD-10-CM | POA: Diagnosis not present

## 2024-02-03 DIAGNOSIS — K5900 Constipation, unspecified: Secondary | ICD-10-CM | POA: Diagnosis not present

## 2024-02-03 DIAGNOSIS — G894 Chronic pain syndrome: Secondary | ICD-10-CM | POA: Diagnosis present

## 2024-02-03 HISTORY — PX: TRANSFORAMINAL LUMBAR INTERBODY FUSION W/ MIS 1 LEVEL: SHX6145

## 2024-02-03 HISTORY — PX: LUMBAR LAMINECTOMY/DECOMPRESSION MICRODISCECTOMY: SHX5026

## 2024-02-03 HISTORY — PX: HARDWARE REMOVAL: SHX979

## 2024-02-03 HISTORY — PX: ANTERIOR LAT LUMBAR FUSION: SHX1168

## 2024-02-03 LAB — ABO/RH: ABO/RH(D): O POS

## 2024-02-03 SURGERY — ANTERIOR LATERAL LUMBAR FUSION 1 LEVEL
Anesthesia: General | Site: Spine Lumbar | Laterality: Left

## 2024-02-03 MED ORDER — SUCCINYLCHOLINE CHLORIDE 200 MG/10ML IV SOSY
PREFILLED_SYRINGE | INTRAVENOUS | Status: AC
  Filled 2024-02-03: qty 10

## 2024-02-03 MED ORDER — KETAMINE HCL 50 MG/5ML IJ SOSY
PREFILLED_SYRINGE | INTRAMUSCULAR | Status: AC
  Filled 2024-02-03: qty 5

## 2024-02-03 MED ORDER — ONDANSETRON HCL 4 MG/2ML IJ SOLN
INTRAMUSCULAR | Status: AC
  Filled 2024-02-03: qty 2

## 2024-02-03 MED ORDER — HYDROMORPHONE HCL 1 MG/ML IJ SOLN: 0.5000 mg | INTRAMUSCULAR | Status: DC | PRN

## 2024-02-03 MED ORDER — LORAZEPAM 0.5 MG PO TABS: 0.5000 mg | ORAL_TABLET | Freq: Every evening | ORAL | Status: DC | PRN

## 2024-02-03 MED ORDER — LISINOPRIL 20 MG PO TABS
20.0000 mg | ORAL_TABLET | Freq: Every day | ORAL | Status: DC
  Administered 2024-02-04: 20 mg via ORAL
  Filled 2024-02-03: qty 1

## 2024-02-03 MED ORDER — THROMBIN 5000 UNITS EX KIT
PACK | CUTANEOUS | Status: AC
  Filled 2024-02-03: qty 1

## 2024-02-03 MED ORDER — OXYCODONE HCL 5 MG PO TABS: 5.0000 mg | ORAL_TABLET | ORAL | Status: DC | PRN

## 2024-02-03 MED ORDER — ONDANSETRON HCL 4 MG PO TABS: 4.0000 mg | ORAL_TABLET | Freq: Four times a day (QID) | ORAL | Status: DC | PRN

## 2024-02-03 MED ORDER — CEFAZOLIN SODIUM-DEXTROSE 2-4 GM/100ML-% IV SOLN
2.0000 g | Freq: Four times a day (QID) | INTRAVENOUS | Status: AC
  Administered 2024-02-03 (×2): 2 g via INTRAVENOUS
  Filled 2024-02-03 (×2): qty 100

## 2024-02-03 MED ORDER — PROPOFOL 10 MG/ML IV BOLUS
INTRAVENOUS | Status: DC | PRN
  Administered 2024-02-03: 150 mg via INTRAVENOUS

## 2024-02-03 MED ORDER — LIDOCAINE 2% (20 MG/ML) 5 ML SYRINGE
INTRAMUSCULAR | Status: AC
  Filled 2024-02-03: qty 5

## 2024-02-03 MED ORDER — POLYETHYLENE GLYCOL 3350 17 G PO PACK: 17.0000 g | PACK | Freq: Every day | ORAL | Status: DC | PRN

## 2024-02-03 MED ORDER — CHLORHEXIDINE GLUCONATE CLOTH 2 % EX PADS: 6.0000 | MEDICATED_PAD | Freq: Once | CUTANEOUS | Status: DC

## 2024-02-03 MED ORDER — BUPIVACAINE HCL (PF) 0.5 % IJ SOLN
INTRAMUSCULAR | Status: DC | PRN
  Administered 2024-02-03: 20 mL

## 2024-02-03 MED ORDER — DEXAMETHASONE SODIUM PHOSPHATE 10 MG/ML IJ SOLN
INTRAMUSCULAR | Status: AC
  Filled 2024-02-03: qty 1

## 2024-02-03 MED ORDER — MIDAZOLAM HCL 2 MG/2ML IJ SOLN: 0.5000 mg | Freq: Once | INTRAMUSCULAR | Status: DC | PRN

## 2024-02-03 MED ORDER — LIDOCAINE-EPINEPHRINE 1 %-1:100000 IJ SOLN
INTRAMUSCULAR | Status: AC
  Filled 2024-02-03: qty 1

## 2024-02-03 MED ORDER — SODIUM CHLORIDE 0.9% FLUSH: 3.0000 mL | INTRAVENOUS | Status: DC | PRN

## 2024-02-03 MED ORDER — HYDROMORPHONE HCL 1 MG/ML IJ SOLN
0.2500 mg | INTRAMUSCULAR | Status: DC | PRN
  Administered 2024-02-03 (×5): 0.5 mg via INTRAVENOUS

## 2024-02-03 MED ORDER — BUPIVACAINE LIPOSOME 1.3 % IJ SUSP
INTRAMUSCULAR | Status: AC
  Filled 2024-02-03: qty 20

## 2024-02-03 MED ORDER — LORAZEPAM 0.5 MG PO TABS
2.0000 mg | ORAL_TABLET | Freq: Every day | ORAL | Status: DC
  Administered 2024-02-03: 2 mg via ORAL
  Filled 2024-02-03: qty 4

## 2024-02-03 MED ORDER — FLEET ENEMA RE ENEM: 1.0000 | ENEMA | Freq: Once | RECTAL | Status: DC | PRN

## 2024-02-03 MED ORDER — EPHEDRINE SULFATE-NACL 50-0.9 MG/10ML-% IV SOSY
PREFILLED_SYRINGE | INTRAVENOUS | Status: DC | PRN
  Administered 2024-02-03: 5 mg via INTRAVENOUS

## 2024-02-03 MED ORDER — BACLOFEN 10 MG PO TABS
20.0000 mg | ORAL_TABLET | Freq: Three times a day (TID) | ORAL | Status: DC
  Administered 2024-02-03 – 2024-02-04 (×4): 20 mg via ORAL
  Filled 2024-02-03 (×4): qty 2

## 2024-02-03 MED ORDER — OXYCODONE HCL 5 MG PO TABS
10.0000 mg | ORAL_TABLET | ORAL | Status: DC | PRN
  Administered 2024-02-03 – 2024-02-04 (×7): 10 mg via ORAL
  Filled 2024-02-03 (×7): qty 2

## 2024-02-03 MED ORDER — HYDROMORPHONE HCL 1 MG/ML IJ SOLN
INTRAMUSCULAR | Status: AC
  Filled 2024-02-03: qty 1

## 2024-02-03 MED ORDER — ACETAMINOPHEN 650 MG RE SUPP: 650.0000 mg | RECTAL | Status: DC | PRN

## 2024-02-03 MED ORDER — RALOXIFENE HCL 60 MG PO TABS
60.0000 mg | ORAL_TABLET | Freq: Every day | ORAL | Status: DC
  Administered 2024-02-04: 60 mg via ORAL
  Filled 2024-02-03: qty 1

## 2024-02-03 MED ORDER — VALACYCLOVIR HCL 500 MG PO TABS: 500.0000 mg | ORAL_TABLET | Freq: Every day | ORAL | Status: DC | PRN

## 2024-02-03 MED ORDER — SUCCINYLCHOLINE CHLORIDE 200 MG/10ML IV SOSY
PREFILLED_SYRINGE | INTRAVENOUS | Status: DC | PRN
  Administered 2024-02-03: 140 mg via INTRAVENOUS

## 2024-02-03 MED ORDER — OXYCODONE HCL 5 MG/5ML PO SOLN: 5.0000 mg | Freq: Once | ORAL | Status: DC | PRN

## 2024-02-03 MED ORDER — 0.9 % SODIUM CHLORIDE (POUR BTL) OPTIME
TOPICAL | Status: DC | PRN
  Administered 2024-02-03: 1000 mL

## 2024-02-03 MED ORDER — DICYCLOMINE HCL 10 MG PO CAPS: 10.0000 mg | ORAL_CAPSULE | Freq: Three times a day (TID) | ORAL | Status: DC | PRN

## 2024-02-03 MED ORDER — ONDANSETRON HCL 4 MG/2ML IJ SOLN: 4.0000 mg | Freq: Four times a day (QID) | INTRAMUSCULAR | Status: DC | PRN

## 2024-02-03 MED ORDER — HYDROMORPHONE HCL 1 MG/ML IJ SOLN
INTRAMUSCULAR | Status: DC | PRN
Start: 2024-02-03 — End: 2024-02-03
  Administered 2024-02-03: .5 mg via INTRAVENOUS

## 2024-02-03 MED ORDER — PANTOPRAZOLE SODIUM 40 MG PO TBEC
80.0000 mg | DELAYED_RELEASE_TABLET | Freq: Every day | ORAL | Status: DC
  Administered 2024-02-03 – 2024-02-04 (×2): 80 mg via ORAL
  Filled 2024-02-03 (×2): qty 2

## 2024-02-03 MED ORDER — EPHEDRINE 5 MG/ML INJ
INTRAVENOUS | Status: AC
  Filled 2024-02-03: qty 5

## 2024-02-03 MED ORDER — ALBUMIN HUMAN 5 % IV SOLN: INTRAVENOUS | Status: DC | PRN

## 2024-02-03 MED ORDER — PHENYLEPHRINE HCL-NACL 20-0.9 MG/250ML-% IV SOLN
INTRAVENOUS | Status: DC | PRN
  Administered 2024-02-03: 50 ug/min via INTRAVENOUS

## 2024-02-03 MED ORDER — HYDROMORPHONE HCL 1 MG/ML IJ SOLN
INTRAMUSCULAR | Status: AC
  Filled 2024-02-03: qty 0.5

## 2024-02-03 MED ORDER — SODIUM CHLORIDE 0.9 % IV SOLN: 250.0000 mL | INTRAVENOUS | Status: AC

## 2024-02-03 MED ORDER — HYDROMORPHONE HCL 1 MG/ML IJ SOLN
0.5000 mg | INTRAMUSCULAR | Status: DC | PRN
  Administered 2024-02-03: 0.5 mg via INTRAVENOUS

## 2024-02-03 MED ORDER — FENTANYL CITRATE (PF) 250 MCG/5ML IJ SOLN
INTRAMUSCULAR | Status: AC
  Filled 2024-02-03: qty 5

## 2024-02-03 MED ORDER — OXYCODONE HCL 5 MG PO TABS: 5.0000 mg | ORAL_TABLET | Freq: Once | ORAL | Status: DC | PRN

## 2024-02-03 MED ORDER — BUPIVACAINE HCL (PF) 0.5 % IJ SOLN
INTRAMUSCULAR | Status: AC
  Filled 2024-02-03: qty 30

## 2024-02-03 MED ORDER — FENTANYL CITRATE (PF) 250 MCG/5ML IJ SOLN
INTRAMUSCULAR | Status: DC | PRN
  Administered 2024-02-03: 250 ug via INTRAVENOUS

## 2024-02-03 MED ORDER — LIDOCAINE-EPINEPHRINE 1 %-1:100000 IJ SOLN
INTRAMUSCULAR | Status: DC | PRN
  Administered 2024-02-03: 17 mL
  Administered 2024-02-03: 6 mL
  Administered 2024-02-03: 7 mL

## 2024-02-03 MED ORDER — ACETAMINOPHEN 325 MG PO TABS
650.0000 mg | ORAL_TABLET | ORAL | Status: DC | PRN
  Administered 2024-02-03: 650 mg via ORAL
  Filled 2024-02-03: qty 2

## 2024-02-03 MED ORDER — KETOROLAC TROMETHAMINE 15 MG/ML IJ SOLN
7.5000 mg | Freq: Four times a day (QID) | INTRAMUSCULAR | Status: AC
  Administered 2024-02-03 – 2024-02-04 (×4): 7.5 mg via INTRAVENOUS
  Filled 2024-02-03 (×4): qty 1

## 2024-02-03 MED ORDER — BUPIVACAINE LIPOSOME 1.3 % IJ SUSP
INTRAMUSCULAR | Status: DC | PRN
  Administered 2024-02-03: 20 mL

## 2024-02-03 MED ORDER — ATORVASTATIN CALCIUM 10 MG PO TABS
10.0000 mg | ORAL_TABLET | Freq: Every day | ORAL | Status: DC
  Administered 2024-02-04: 10 mg via ORAL
  Filled 2024-02-03: qty 1

## 2024-02-03 MED ORDER — THROMBIN 5000 UNITS EX SOLR: OROMUCOSAL | Status: DC | PRN

## 2024-02-03 MED ORDER — MIDAZOLAM HCL 2 MG/2ML IJ SOLN
INTRAMUSCULAR | Status: AC
  Filled 2024-02-03: qty 2

## 2024-02-03 MED ORDER — CEFAZOLIN SODIUM-DEXTROSE 2-4 GM/100ML-% IV SOLN
2.0000 g | INTRAVENOUS | Status: AC
  Administered 2024-02-03: 2 g via INTRAVENOUS
  Filled 2024-02-03: qty 100

## 2024-02-03 MED ORDER — LIDOCAINE 2% (20 MG/ML) 5 ML SYRINGE
INTRAMUSCULAR | Status: DC | PRN
  Administered 2024-02-03: 40 mg via INTRAVENOUS

## 2024-02-03 MED ORDER — SODIUM CHLORIDE 0.9% FLUSH
3.0000 mL | Freq: Two times a day (BID) | INTRAVENOUS | Status: DC
  Administered 2024-02-04: 3 mL via INTRAVENOUS

## 2024-02-03 MED ORDER — PROPOFOL 500 MG/50ML IV EMUL
INTRAVENOUS | Status: DC | PRN
  Administered 2024-02-03 (×2): 75 ug/kg/min via INTRAVENOUS

## 2024-02-03 MED ORDER — LACTATED RINGERS IV SOLN: INTRAVENOUS | Status: DC

## 2024-02-03 MED ORDER — ORAL CARE MOUTH RINSE: 15.0000 mL | Freq: Once | OROMUCOSAL | Status: AC

## 2024-02-03 MED ORDER — ACETAMINOPHEN 500 MG PO TABS
1000.0000 mg | ORAL_TABLET | Freq: Once | ORAL | Status: AC
  Administered 2024-02-03: 500 mg via ORAL
  Filled 2024-02-03: qty 2

## 2024-02-03 MED ORDER — DEXAMETHASONE SODIUM PHOSPHATE 10 MG/ML IJ SOLN
INTRAMUSCULAR | Status: DC | PRN
  Administered 2024-02-03: 5 mg via INTRAVENOUS

## 2024-02-03 MED ORDER — HYDROMORPHONE HCL 1 MG/ML IJ SOLN
INTRAMUSCULAR | Status: AC
Start: 2024-02-03 — End: 2024-02-04
  Filled 2024-02-03: qty 1

## 2024-02-03 MED ORDER — PROPOFOL 1000 MG/100ML IV EMUL
INTRAVENOUS | Status: AC
  Filled 2024-02-03: qty 200

## 2024-02-03 MED ORDER — CHLORHEXIDINE GLUCONATE 0.12 % MT SOLN
15.0000 mL | Freq: Once | OROMUCOSAL | Status: AC
  Administered 2024-02-03: 15 mL via OROMUCOSAL
  Filled 2024-02-03: qty 15

## 2024-02-03 MED ORDER — MIDAZOLAM HCL 2 MG/2ML IJ SOLN
INTRAMUSCULAR | Status: DC | PRN
  Administered 2024-02-03: 2 mg via INTRAVENOUS

## 2024-02-03 MED ORDER — MEPERIDINE HCL 25 MG/ML IJ SOLN: 6.2500 mg | INTRAMUSCULAR | Status: DC | PRN

## 2024-02-03 MED ORDER — MENTHOL 3 MG MT LOZG: 1.0000 | LOZENGE | OROMUCOSAL | Status: DC | PRN

## 2024-02-03 MED ORDER — PHENOL 1.4 % MT LIQD: 1.0000 | OROMUCOSAL | Status: DC | PRN

## 2024-02-03 MED ORDER — DOCUSATE SODIUM 100 MG PO CAPS
100.0000 mg | ORAL_CAPSULE | Freq: Two times a day (BID) | ORAL | Status: DC
  Administered 2024-02-03 – 2024-02-04 (×2): 100 mg via ORAL
  Filled 2024-02-03 (×2): qty 1

## 2024-02-03 MED ORDER — ONDANSETRON HCL 4 MG/2ML IJ SOLN
INTRAMUSCULAR | Status: DC | PRN
  Administered 2024-02-03: 4 mg via INTRAVENOUS

## 2024-02-03 SURGICAL SUPPLY — 89 items
BAG COUNTER SPONGE SURGICOUNT (BAG) ×2 IMPLANT
BAND RUBBER #18 3X1/16 STRL (MISCELLANEOUS) ×4 IMPLANT
BIT DRILL NEURO 2X3.1 SFT TUCH (MISCELLANEOUS) IMPLANT
BLADE CLIPPER SURG (BLADE) IMPLANT
BLADE SURG 11 STRL SS (BLADE) ×2 IMPLANT
BUR MATCHSTICK NEURO 3.0 LAGG (BURR) IMPLANT
BUR SURG IBUR 4X12.5 (BURR) ×2 IMPLANT
BURR SURG IBUR 4X12.5 (BURR) ×2 IMPLANT
CANISTER SUCT 3000ML PPV (MISCELLANEOUS) ×2 IMPLANT
CAP PUSHER PTP (ORTHOPEDIC DISPOSABLE SUPPLIES) IMPLANT
CLIP SPRING STIM LLIF SAFEOP (CLIP) IMPLANT
CNTNR URN SCR LID CUP LEK RST (MISCELLANEOUS) ×2 IMPLANT
COVER BACK TABLE 60X90IN (DRAPES) ×2 IMPLANT
DERMABOND ADVANCED .7 DNX12 (GAUZE/BANDAGES/DRESSINGS) ×4 IMPLANT
DILATOR INSULATED LLIF 8-13-18 (NEUROSURGERY SUPPLIES) IMPLANT
DISSECTOR BLUNT TIP ENDO 5MM (MISCELLANEOUS) IMPLANT
DRAIN JACKSON PRATT 10MM FLAT (MISCELLANEOUS) IMPLANT
DRAPE 3/4 80X56 (DRAPES) ×2 IMPLANT
DRAPE C-ARM 42X72 X-RAY (DRAPES) ×4 IMPLANT
DRAPE C-ARMOR (DRAPES) ×2 IMPLANT
DRAPE LAPAROTOMY 100X72X124 (DRAPES) ×2 IMPLANT
DRAPE MICROSCOPE SLANT 54X150 (MISCELLANEOUS) ×2 IMPLANT
DRAPE SURG ORHT 6 SPLT 77X108 (DRAPES) IMPLANT
DRAPE UTILITY XL STRL (DRAPES) ×2 IMPLANT
DRILL NEURO 2X3.1 SOFT TOUCH (MISCELLANEOUS) ×2 IMPLANT
DRSG OPSITE POSTOP 4X6 (GAUZE/BANDAGES/DRESSINGS) IMPLANT
DRSG OPSITE POSTOP 4X8 (GAUZE/BANDAGES/DRESSINGS) IMPLANT
DURAPREP 26ML APPLICATOR (WOUND CARE) ×2 IMPLANT
ELECT BLADE INSULATED 6.5IN (ELECTROSURGICAL) ×2 IMPLANT
ELECT COATED BLADE 2.86 ST (ELECTRODE) ×2 IMPLANT
ELECT KIT SAFEOP SSEP/SURF (KITS) ×2 IMPLANT
ELECT REM PT RETURN 9FT ADLT (ELECTROSURGICAL) ×2 IMPLANT
ELECTRODE BLDE INSULATED 6.5IN (ELECTROSURGICAL) ×2 IMPLANT
ELECTRODE KT SAFEOP SSEP/SURF (KITS) IMPLANT
ELECTRODE REM PT RTRN 9FT ADLT (ELECTROSURGICAL) ×2 IMPLANT
EVACUATOR SILICONE 100CC (DRAIN) IMPLANT
GAUZE 4X4 16PLY ~~LOC~~+RFID DBL (SPONGE) IMPLANT
GAUZE SPONGE 4X4 12PLY STRL (GAUZE/BANDAGES/DRESSINGS) IMPLANT
GLOVE BIO SURGEON STRL SZ7 (GLOVE) ×4 IMPLANT
GLOVE BIOGEL PI IND STRL 7.5 (GLOVE) ×4 IMPLANT
GLOVE ECLIPSE 7.5 STRL STRAW (GLOVE) ×2 IMPLANT
GLOVE EXAM NITRILE XL STR (GLOVE) IMPLANT
GOWN STRL REUS W/ TWL LRG LVL3 (GOWN DISPOSABLE) ×2 IMPLANT
GOWN STRL REUS W/ TWL XL LVL3 (GOWN DISPOSABLE) ×6 IMPLANT
GOWN STRL REUS W/TWL 2XL LVL3 (GOWN DISPOSABLE) IMPLANT
GUIDEWIRE LLIF TT 310 (WIRE) IMPLANT
HEMOSTAT POWDER KIT SURGIFOAM (HEMOSTASIS) ×2 IMPLANT
KIT BASIN OR (CUSTOM PROCEDURE TRAY) ×2 IMPLANT
KIT INFUSE X SMALL 1.4CC (Orthopedic Implant) IMPLANT
KIT TURNOVER KIT B (KITS) ×2 IMPLANT
MATRIX STRIP NEOCORE 12C (Putty) IMPLANT
MODULE POSITIONING LIF 2.0 (INSTRUMENTS) IMPLANT
NDL BEVEL TWO-PAK W/1PK (NEEDLE) IMPLANT
NDL HYPO 18GX1.5 BLUNT FILL (NEEDLE) IMPLANT
NDL HYPO 21X1.5 SAFETY (NEEDLE) ×2 IMPLANT
NDL HYPO 22X1.5 SAFETY MO (MISCELLANEOUS) ×2 IMPLANT
NDL SPNL 18GX3.5 QUINCKE PK (NEEDLE) IMPLANT
NEEDLE BEVEL TWO-PAK W/1PK (NEEDLE) ×2 IMPLANT
NEEDLE HYPO 18GX1.5 BLUNT FILL (NEEDLE) IMPLANT
NEEDLE HYPO 21X1.5 SAFETY (NEEDLE) ×2 IMPLANT
NEEDLE HYPO 22X1.5 SAFETY MO (MISCELLANEOUS) ×2 IMPLANT
NEEDLE SPNL 18GX3.5 QUINCKE PK (NEEDLE) IMPLANT
NS IRRIG 1000ML POUR BTL (IV SOLUTION) ×2 IMPLANT
PACK LAMINECTOMY NEURO (CUSTOM PROCEDURE TRAY) ×2 IMPLANT
PAD ARMBOARD 7.5X6 YLW CONV (MISCELLANEOUS) ×6 IMPLANT
PROBE BALL TIP LLIF SAFEOP (NEUROSURGERY SUPPLIES) IMPLANT
ROD LORD MIS TI 5.5X40 (Rod) IMPLANT
SCREW CANN FENESTRA 6.5X50 (Screw) IMPLANT
SCREW CANN INV FENS 5.5X45 (Screw) IMPLANT
SET SCREW SPNE (Screw) IMPLANT
SHIM INTRADISCAL PTP WIDE (ORTHOPEDIC DISPOSABLE SUPPLIES) IMPLANT
SPACER IDENTITI  22X50X10 10D (Spacer) IMPLANT
SPIKE FLUID TRANSFER (MISCELLANEOUS) ×2 IMPLANT
SPONGE NEURO XRAY DETECT 1X3 (DISPOSABLE) IMPLANT
SPONGE SURGIFOAM ABS GEL 100 (HEMOSTASIS) IMPLANT
SPONGE T-LAP 4X18 ~~LOC~~+RFID (SPONGE) IMPLANT
SPONGE TONSIL 1 RF SGL (DISPOSABLE) IMPLANT
STAPLER VISISTAT (STAPLE) ×2 IMPLANT
STEM REV 115X14 (Stem) IMPLANT
SUT MNCRL AB 4-0 PS2 18 (SUTURE) ×2 IMPLANT
SUT VIC AB 0 CT1 18XCR BRD8 (SUTURE) ×2 IMPLANT
SUT VIC AB 2-0 CP2 18 (SUTURE) ×2 IMPLANT
SYR 20ML LL LF (SYRINGE) ×2 IMPLANT
SYR 30ML LL (SYRINGE) ×2 IMPLANT
SYS LTP ILLUMINATOR SIGMA ATEC (ORTHOPEDIC DISPOSABLE SUPPLIES) IMPLANT
TOWEL GREEN STERILE (TOWEL DISPOSABLE) ×2 IMPLANT
TOWEL GREEN STERILE FF (TOWEL DISPOSABLE) ×2 IMPLANT
TRAY FOLEY MTR SLVR 16FR STAT (SET/KITS/TRAYS/PACK) ×2 IMPLANT
WATER STERILE IRR 1000ML POUR (IV SOLUTION) ×2 IMPLANT

## 2024-02-03 NOTE — H&P (Signed)
 CC: back and left leg pain  HPI:      73 yo F has developed worsening back pain with leg pain, similar to what she had prior to surgery when she had a synovial cyst. . It affects her left leg, going around front of thigh to the knee. She also has pain in her right calf area, as well as bilateral buttocks. Symptoms are worse with activity, now activity is severely curbed. She is more comfortable sitting and resting, worse with walking or activity. Uses a walker at night. She is seeing Stefani Dama at [facility]. She has been given multiple courses of prednisone which has not helped. First back surgery was in 2009, next in 2011, and then in 2012. She did PT after her compression fracture, from which she feels fully recovered.      Patient Active Problem List   Diagnosis Date Noted   Constipation 04/21/2017   Diverticulitis 03/02/2012   High cholesterol 03/02/2012   Past Medical History:  Diagnosis Date   Anxiety    Atypical mole 08/14/2010   left shin tx mohs (atypical prolif.)   Atypical nevi 08/28/2010   left heel mild/mod. 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   Hypercholesteremia    Hypertension    IBS (irritable bowel syndrome)    Lumbar spondylosis with myelopathy    Lumbosacral pain, chronic    Numbness and tingling of right arm    resolved per patient on 01/28/24   Osteoporosis    Pneumonia    hx x 1    Past Surgical History:  Procedure Laterality Date   APPENDECTOMY     CAROTID ENDARTERECTOMY     pt denies this dx - pt states had parotid gland removed right side-cancer-was contained   CHOLECYSTECTOMY     COLONOSCOPY  2021   x several - last one on 07/23/19   KNEE SURGERY Left     Left 12/2016, 08/2017   Spinal fusion x 2     TONSILLECTOMY     TOTAL ABDOMINAL HYSTERECTOMY     UPPER GI ENDOSCOPY  07/23/2019    Facility-Administered Medications Prior to Admission  Medication Dose Route Frequency Provider Last Rate Last Admin   0.9 %  sodium chloride infusion  500 mL Intravenous Once Armbruster, Willaim Rayas, MD       Medications Prior to Admission  Medication Sig Dispense Refill Last Dose/Taking   atorvastatin (LIPITOR) 10 MG tablet Take 10 mg by mouth daily.   Taking   baclofen (LIORESAL) 20 MG tablet Take 20 mg by mouth 3 (three) times daily.   Taking   calcium carbonate (OSCAL) 1500 (600 Ca) MG TABS tablet Take 600 mg of elemental calcium by mouth 2 (two) times daily with a meal.   Taking   Cholecalciferol (VITAMIN D3) 125 MCG (5000 UT) CAPS Take 5,000 Units by mouth in the morning and at bedtime.   Taking   HYDROcodone-acetaminophen (NORCO) 10-325 MG per tablet Take 1 tablet by mouth every 6 (six) hours as needed for moderate pain.   Taking As Needed   lisinopril (PRINIVIL,ZESTRIL) 20 MG tablet Take 20  mg by mouth daily.   Taking   LORazepam (ATIVAN) 0.5 MG tablet Take 0.5 mg by mouth at bedtime as needed.   Taking As Needed   LORazepam (ATIVAN) 2 MG tablet Take 2 mg by mouth at bedtime.   Taking   Multiple Vitamins-Minerals (MULTIVITAMIN WITH MINERALS) tablet Take 1 tablet by mouth daily.   Taking   NARCAN 4 MG/0.1ML LIQD nasal spray kit Place 0.4 mg into the nose once.   Taking   omeprazole (PRILOSEC) 40 MG capsule Take 40 mg by mouth daily.   Taking   raloxifene (EVISTA) 60 MG tablet Take 60 mg by mouth daily.   Taking   valACYclovir (VALTREX) 500 MG tablet Take 1 tablet by mouth once daily (Patient taking differently: Take 500 mg by mouth daily as needed (outbreak).) 30 tablet 8 Taking Differently   dicyclomine (BENTYL) 10 MG capsule TAKE 1 TO 2 CAPSULES (10-20 MG TOTAL) BY MOUTH EVERY 8 HOURS AS NEEDED FOR SPASMS (Patient not taking: Reported on 01/26/2024) 90  capsule 1 Not Taking   Fluorouracil (TOLAK) 4 % CREA APPLY TO AFFECTED AREA NIGHTLY x 2 weeks. (Patient not taking: Reported on 02/14/2022) 40 g 0    nortriptyline (PAMELOR) 10 MG/5ML solution Take 7.5 mLs (15 mg total) by mouth at bedtime. (Patient not taking: Reported on 02/14/2022) 240 mL 6    omeprazole (PRILOSEC) 20 MG capsule Take 1 capsule (20 mg total) by mouth 2 (two) times daily before a meal. Please schedule an Office Visit for further refills. Thank you (Patient not taking: Reported on 01/26/2024) 60 capsule 1 Not Taking   Peppermint Oil (IBGARD) 90 MG CPCR Take 2 capsules by mouth as needed. Take as directed as needed (Patient not taking: Reported on 02/14/2022)      Allergies  Allergen Reactions   Ciprofloxacin Diarrhea   Sulfa Antibiotics Itching    itch    Social History   Tobacco Use   Smoking status: Never   Smokeless tobacco: Never  Substance Use Topics   Alcohol use: No    Family History  Problem Relation Age of Onset   Throat cancer Brother    Colon cancer Neg Hx    Esophageal cancer Neg Hx    Stomach cancer Neg Hx    Rectal cancer Neg Hx      Review of Systems Pertinent items noted in HPI and remainder of comprehensive ROS otherwise negative.  Objective:   Patient Vitals for the past 8 hrs:  BP Temp Temp src Pulse Resp SpO2 Height Weight  02/03/24 0808 134/82 98.4 F (36.9 C) Oral 83 18 98 % 4\' 11"  (1.499 m) 74.8 kg   No intake/output data recorded. No intake/output data recorded.      General : Alert, cooperative, no distress, appears stated age   Head:  Normocephalic/atraumatic    Eyes: PERRL, conjunctiva/corneas clear, EOM's intact. Fundi could not be visualized Neck: Supple Chest:  Respirations unlabored Chest wall: no tenderness or deformity Heart: Regular rate and rhythm Abdomen: Soft, nontender and nondistended Extremities: warm and well-perfused Skin: normal turgor, color and texture Neurologic:  Alert, oriented x 3.  Eyes open  spontaneously. PERRL, EOMI, VFC, no facial droop. V1-3 intact.  No dysarthria, tongue protrusion symmetric.  CNII-XII intact. Normal strength, sensation and reflexes throughout.  No pronator drift, full strength in legs. + SLR.  Well-healed incision       Data ReviewCBC:  Lab Results  Component Value Date   WBC 4.3 01/28/2024   RBC  4.27 01/28/2024   BMP:  Lab Results  Component Value Date   GLUCOSE 120 (H) 01/28/2024   CO2 27 01/28/2024   BUN 9 01/28/2024   CREATININE 0.71 01/28/2024   CREATININE 0.70 07/20/2014   CALCIUM 9.3 01/28/2024   Radiology review:  See clinic note for detailsd  Assessment:   Active Problems:   * No active hospital problems. 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 She feels her symptoms are worsening and her quality of life is very poor currently. I discussed treatment options with her including additional physical therapy but she declined this as every time she did physical therapy before it made her symptoms worse. I discussed options of injections but she declined this as well. Surgically, she would benefit from extending her fusion to L3-4 and diskectomy. To minimize scarring and given her mild osteopenia, this would best be accomplished with a left L3-4 DLIF, left MIS microdisectomy and posterior percutaneous extension of fusion to L3. I discussed with him the general technique of surgery, as well as risks, benefits, alternatives, and expected convalescence. Risks discussed included, but were not limited to, bleeding, pain, infection, scar, instability, adjacent segment disease, pseudoarthrosis, damage to nearby organs, spinal fluid leak, neurologic deficit, and death. Informed  consent was obtained and the patient wished to proceed with surgery.

## 2024-02-03 NOTE — Anesthesia Procedure Notes (Signed)
 Procedure Name: Intubation Date/Time: 02/03/2024 9:30 AM  Performed by: Susy Manor, CRNAPre-anesthesia Checklist: Patient identified, Emergency Drugs available, Suction available and Patient being monitored Patient Re-evaluated:Patient Re-evaluated prior to induction Oxygen Delivery Method: Circle System Utilized Preoxygenation: Pre-oxygenation with 100% oxygen Induction Type: IV induction Ventilation: Mask ventilation without difficulty Laryngoscope Size: Mac and 4 Grade View: Grade I Tube type: Oral Tube size: 7.5 mm Number of attempts: 1 Airway Equipment and Method: Stylet and Oral airway Placement Confirmation: ETT inserted through vocal cords under direct vision, positive ETCO2 and breath sounds checked- equal and bilateral Secured at: 22 cm Tube secured with: Tape Dental Injury: Teeth and Oropharynx as per pre-operative assessment

## 2024-02-03 NOTE — Op Note (Signed)
 PREOP DIAGNOSIS: Lumbar radiculopathy, adjacent segment disease   POSTOP DIAGNOSIS: lumbar radiculopathy, adjacent segment disease   PROCEDURE: 1. L3-4 anterior/lateral lumbar interbody fusion via left prone transpsoas retroperitoneal approach 2. Posterolateral arthrodesis, L3-4 3. Placement of interbody cage L3-4 4. Removal of previous posterior spinal instrumentation, placement of nonsegmental instrumentation with percutaneously placed pedicle screw and rod construct at L3-4 5. Harvest of local autograft 6. Use of morselized allograft   SURGEON: Dr. Hoyt Koch, MD   ASSISTANT: Patrici Ranks, PA.  Please note no qualified trainees were available to assist with the procedure.  Assistance was required through the entirety of the procedure.   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  I had a long discussion with the patient. Treatment options were discussed and the patient elected to proceed with L3-4 DLIF and posterior percutaneous extension of fusion as well as MIS microdiscectomy. risks, benefits, alternatives, and expected convalescence were discussed with the patient.  Risks discussed included but were not limited to bleeding, pain, infection, scar, pseudoarthrosis, CSF leak, neurologic deficit, paralysis, and death.  The  patient wished to proceed with surgery and informed consent was obtained.   PROCEDURE IN DETAIL: The patient was brought to the operative room.  General anesthesia was induced and patient was intubated by the anesthesia service.  After appropriate lines and monitors were placed, patient was positioned prone on open Redrock table.  All pressure points padded and the eyes were protected.  The patient and the bed was positioned for a true lateral and AP C-arm x-rays at each level.  The flank and lower back was preprepped with alcohol and prepped and draped in sterile fashion.  A timeout was performed.  1% at lidocaine with epinephrine was injected in the planned incision.  Using the C-arm x-ray, a oblique incision was planned over the left flank over the L3-4 disc space.  Incision was made with a 10 blade and the fascia was opened.  The external oblique, internal oblique, and transversalis muscle and transversalis fascia were opened bluntly with spreading instruments.  The retroperitoneal space was dissected and the peritoneal contents were reflected anteriorly.  The quadratus lumborum, transverse process and finally the psoas muscle was dissected out.  A initial dilator was placed on the lateral surface of the psoas muscle over the L3-4 disc space as confirmed by x-ray.  The dilator was then passed through the psoas muscle and docked onto the disc.  Stimulation showed there was no proximity of any lumbar plexus nerves.  The dilator was secured in place with a K wire and subsequent dilators were passed and stimulated and again yielded no neural activity nearby.  Retractor was then passed onto the disc space and secured in place with a bed attachment.  The dilators were removed and the field  on the posterior blade were directly stimulated.  There was no visual evidence or electromyographic evidence of nerves in the field.  The retractor was then opened anteriorly and again the field was stimulated with no evidence of  nerves in the field.  Annulotomy was performed with a retractable blade and discectomy was performed with pituitary rongeurs and box cutters.  The contralateral annulus was released with Cobb and the disc space was prepared with curettes and rasps.  Trial implants were placed and with the aid of fluoroscopy with AP and lateral projections, a size 10 mm graft was placed.  Significant restoration of disc space and foraminal height was achieved.  The retractor was then removed.    There were no changes in the femoral nerve SSEPs throughout the case.  Final x-rays showed good reduction of spondylolisthesis and lateral listhesis, restoration of disc space height and foraminal height, and partial correction of her coronal deformity.  The retroperitoneal space was then irrigated and examined with no evidence of any bleeding.  The fascia was closed with 0 Vicryl stitches.  The dermal layer was closed with 2-0 Vicryl stitches.  Skin was closed with 4-0 Monocryl subcuticular manner followed by Dermabond.   Under fluoroscopy, the previous pedicle screw and rod instrumentation was marked on the skin and the pedicles of L3 were identified and marked out on the skin, and after timeout was conducted, the skin was infiltrated with local anesthetic.  A 10 blade was used to incise previous percutaneous incisions.  Fascia was incised and Wiltse approach taken to the previous instrumentation.  Screw caps, rods and screws were removed.  The right L4 screws was replaced with an upsized 6.5 x 45 mm screw using C-arm guidance.  Bilateral L3-4 facets were decorticated with high speed drill to perform posterior arthrodesis.  Jamshidis were then placed at the lateral aspect of the L3 pedicles as identified AP fluoroscopy and malleted into the body under x-ray guidance.  C-arm was then switched to lateral which confirmed entry into the body.  K wires were then passed through the Jamshidi's and the sheaths were removed.   The pedicles were  tapped over the K wires and size 6.5 x 45 mm pedicle screws were placed on the right under fluoroscopic guidance.  Good purchase was noted.   Superior aspect of previous midline incision was then opened sharply and fascia was incised on the left side.  Successively large dilators were then placed until a 22 mm tube was docked on the L3 lamina using C-arm guidance.  Microscope was introduced into the field to allow for intraoperative microdissection.   Small amount of remaining musculature was dissected from the lamina and the interspace was visualized as well.  The medial facet was also exposed.  High-speed drill was used to perform a laminotomy as well as a leftmedial facetectomy.  The ligamentum flavum was then removed with rongeurs.  The thecal sac and traversing nerve root were identified.  Thecal sac was noted to be decompressed from the restoration of disc space height.  The epidural disc space was dissected with a 4 Penfield.  Upon retracting the nerve root medially, the large disc herniation was encountered.  The disc herniation was incised with a 15 blade and pituitary rongeur was used to remove disc fragments.  Smaller additional fragments were also removed more superiorly behind the L3 body, though some of this disc was calcified.  Following removal of the herniated disc, the nerve root appeared much more relaxed.  Woodson probe was used to ensure good decompression and there was no longer significant's impingement of the nerve roots.  Meticulous hemostasis was obtained.  The wound was irrigated thoroughly.  The tubular retractor was then withdrawn with hemostasis in the muscle obtained with bipolar.  The fascia was closed with 0 Vicryl stitches.  The dermal layer was closed with 2-0 Vicryl stitches in buried interrupted fashion.    Left L3 and L4 pedicle screws were then placed under C-arm guidance.  Rods were then passed through the screw towers bilaterally and secured with screw caps.  X-ray  confirmed good placement.  The screw caps were final tightened and the towers removed.   Wounds were irrigated thoroughly with bacitracin impregnated irrigation.  Allograft mixed with autograft was placed in the right lateral gutter lateral to the screw/rod construct.  Exparel mixed with Marcaine was injected into the paraspinous muscles and subcutaneous tissues bilaterally.  The fascia was closed with 0 Vicryl stitches.  The dermal layer was closed with 2-0 Vicryl stitches in buried interrupted fashion.  The skin incisions were closed with 4-0 Monocryl subcuticular manner followed by Dermabond.  Sterile dressings were placed.  Patient was then flipped supine and extubated by the anesthesia service following commands and all 4 extremities.  All counts were correct at the end of surgery.  No complications were noted.

## 2024-02-03 NOTE — Transfer of Care (Signed)
 Immediate Anesthesia Transfer of Care Note  Patient: Morgan Rowland  Procedure(s) Performed: PRONE LATERAL LUMBAR INTERBODY FUSION LUMBAR THREE-FOUR (Left: Spine Lumbar) PERCUTANEOUS PLACEMENT OF SCREWS AND EXTENSION OF FUSION TO LUMBAR THREE (Left: Spine Lumbar) LUMBAR FUSION HARDWARE REMOVAL (Left: Spine Lumbar) LEFT LUMBAR THREE-FOUR MINIMALLY INVASIVE (MIS) MICRODISCECTOMY (Spine Lumbar)  Patient Location: PACU  Anesthesia Type:General  Level of Consciousness: sedated  Airway & Oxygen Therapy: Patient Spontanous Breathing and Patient connected to nasal cannula oxygen  Post-op Assessment: Report given to RN and Post -op Vital signs reviewed and stable  Post vital signs: Reviewed and stable  Last Vitals:  Vitals Value Taken Time  BP 94/44 02/03/24 1430  Temp 36.4 C 02/03/24 1427  Pulse 80 02/03/24 1432  Resp 12 02/03/24 1432  SpO2 97 % 02/03/24 1432  Vitals shown include unfiled device data.  Last Pain:  Vitals:   02/03/24 0823  TempSrc:   PainSc: 3       Patients Stated Pain Goal: 1 (02/03/24 4098)  Complications: No notable events documented.

## 2024-02-03 NOTE — Progress Notes (Signed)
 Orthopedic Tech Progress Note Patient Details:  Morgan Rowland August 25, 1951 [id number]  Ortho Devices Type of Ortho Device: Lumbar corsett Ortho Device/Splint Location: BACK Ortho Device/Splint Interventions: Ordered, Other (comment)   Post Interventions Patient Tolerated: Well Instructions Provided: Care of device  Donald Pore 02/03/2024, 4:38 PM

## 2024-02-03 NOTE — Anesthesia Postprocedure Evaluation (Signed)
 Anesthesia Post Note  Patient: Morgan Rowland  Procedure(s) Performed: PRONE LATERAL LUMBAR INTERBODY FUSION LUMBAR THREE-FOUR (Left: Spine Lumbar) PERCUTANEOUS PLACEMENT OF SCREWS AND EXTENSION OF FUSION TO LUMBAR THREE (Left: Spine Lumbar) LUMBAR FUSION HARDWARE REMOVAL (Left: Spine Lumbar) LEFT LUMBAR THREE-FOUR MINIMALLY INVASIVE (MIS) MICRODISCECTOMY (Spine Lumbar)     Patient location during evaluation: PACU Anesthesia Type: General Level of consciousness: awake and alert, oriented and patient cooperative Pain management: pain level controlled (pt much more comfortable, sitting in chair) Vital Signs Assessment: post-procedure vital signs reviewed and stable Respiratory status: spontaneous breathing, nonlabored ventilation and respiratory function stable Cardiovascular status: blood pressure returned to baseline and stable Postop Assessment: no apparent nausea or vomiting Anesthetic complications: no   No notable events documented.  Last Vitals:  Vitals:   02/03/24 1515 02/03/24 1530  BP: (!) 144/78 139/69  Pulse: (!) 110 99  Resp: 17 12  Temp:  36.7 C  SpO2: 97% 96%    Last Pain:  Vitals:   02/03/24 1515  TempSrc:   PainSc: 8                  Tavarious Freel,E. Orphia Mctigue

## 2024-02-03 NOTE — Discharge Summary (Incomplete)
  Patient ID: Morgan Rowland MRN: [id number] DOB/AGE: 1951/05/03 73 y.o.  Admit date: 02/03/2024 Discharge date: 02/03/2024  Admission Diagnoses: Lumbar radiculopathy [M54.16]   Discharge Diagnoses: Same   Discharged Condition: Stable  Hospital Course:  Morgan Rowland is a 73 y.o. female who was admitted following an uncomplicated . They were recovered in PACU and transferred to the floor. Hospital course was uncomplicated. Pt stable for discharge today. Pt to f/u in office for routine post op visit. Pt is in agreement w/ plan.    Discharge Exam: Blood pressure (!) 144/78, pulse (!) 110, temperature 97.6 F (36.4 C), resp. rate 17, height 4\' 11"  (1.499 m), weight 74.8 kg, SpO2 97%. A&O Speech fluent, appropriate Strength/sensation grossly intact BUE/BLE.  Dressings c/d/I.   Disposition:   Discharge Instructions     Incentive spirometry RT   Complete by: As directed       Allergies as of 02/03/2024       Reactions   Ciprofloxacin Diarrhea   Sulfa Antibiotics Itching   itch     Med Rec must be completed prior to using this SMART[employer]***        Signed: Sylena Lotter CAYLIN Morgan Rowland 02/03/2024, 3:17 PM

## 2024-02-04 ENCOUNTER — Encounter (HOSPITAL_COMMUNITY): Payer: Self-pay | Admitting: Neurosurgery

## 2024-02-04 MED ORDER — DOCUSATE SODIUM 100 MG PO CAPS: 100.0000 mg | ORAL_CAPSULE | Freq: Two times a day (BID) | ORAL | 0 refills | Status: AC

## 2024-02-04 MED ORDER — OXYCODONE HCL 10 MG PO TABS: 10.0000 mg | ORAL_TABLET | ORAL | 0 refills | Status: AC | PRN

## 2024-02-04 MED FILL — Thrombin For Soln 5000 Unit: CUTANEOUS | Qty: 5000 | Status: AC

## 2024-02-04 NOTE — Evaluation (Signed)
 Occupational Therapy Evaluation Patient Details Name: Morgan Rowland MRN: [id number] DOB: Apr 14, 1951 Today's Date: 02/04/2024   History of Present Illness   Pt admitted 3/4 for L3-4 anterior/lateral lumbar interbody fusion via left prone transpsoas retroperitoneal approach.PMH:  anxiety, Cervical disc dz, previous Lumbar surgery, IBS, HTN     Clinical Impressions Pt admitted based on above, and was seen based on problem list below. PTA pt was independent with ADLs and IADLs. Today pt is requiring set up to min assist for ADLs. Bed mobility and functional transfers are mod I with RW. Education provided on precautions, don/doff brace, and compensatory stratgeis for ADLs. No follow up OT recommended, pt should progress well at home. OT will continue to follow acutely to maximize functional independence.      If plan is discharge home, recommend the following:   A little help with walking and/or transfers;A little help with bathing/dressing/bathroom;Assistance with cooking/housework     Functional Status Assessment   Patient has had a recent decline in their functional status and demonstrates the ability to make significant improvements in function in a reasonable and predictable amount of time.     Equipment Recommendations   BSC/3in1     Recommendations for Other Services         Precautions/Restrictions   Precautions Precautions: Back Precaution Booklet Issued: Yes (comment) Recall of Precautions/Restrictions: Intact Precaution/Restrictions Comments: No bending, lifting, or twisting Required Braces or Orthoses: Spinal Brace Spinal Brace: Applied in sitting position;Lumbar corset Restrictions Weight Bearing Restrictions Per Provider Order: No     Mobility Bed Mobility Overal bed mobility: Independent   Rolling: Independent         General bed mobility comments: no cueing needed for log rolling    Transfers Overall transfer level: Independent Equipment  used: Rolling walker (2 wheels)                      Balance Overall balance assessment: Needs assistance Sitting-balance support: No upper extremity supported, Feet supported Sitting balance-Leahy Scale: Fair     Standing balance support: During functional activity, Bilateral upper extremity supported Standing balance-Leahy Scale: Fair                             ADL either performed or assessed with clinical judgement   ADL Overall ADL's : Needs assistance/impaired Eating/Feeding: Independent;Sitting   Grooming: Wash/dry hands;Oral care;Independent;Cueing for safety;Standing           Upper Body Dressing : Set up;Cueing for sequencing;Sitting Upper Body Dressing Details (indicate cue type and reason): Set up for donning brace Lower Body Dressing: Minimal assistance;Sit to/from stand Lower Body Dressing Details (indicate cue type and reason): Pt unable to cross legs to reach socks Toilet Transfer: Modified Independent;Rolling walker (2 wheels) Toilet Transfer Details (indicate cue type and reason): Simulated in room         Functional mobility during ADLs: Modified independent;Rolling walker (2 wheels) General ADL Comments: Cueing for brace     Vision Baseline Vision/History: 0 No visual deficits Vision Assessment?: No apparent visual deficits     Perception         Praxis         Pertinent Vitals/Pain Pain Assessment Pain Assessment: Faces Faces Pain Scale: Hurts even more Pain Location: back Pain Descriptors / Indicators: Aching, Discomfort, Grimacing, Guarding Pain Intervention(s): Limited activity within patient's tolerance, Repositioned     Extremity/Trunk Assessment Upper Extremity Assessment Upper Extremity  Assessment: Overall WFL for tasks assessed   Lower Extremity Assessment Lower Extremity Assessment: Defer to PT evaluation   Cervical / Trunk Assessment Cervical / Trunk Assessment: Back Surgery   Communication  Communication Communication: No apparent difficulties   Cognition Arousal: Alert Behavior During Therapy: WFL for tasks assessed/performed Cognition: No apparent impairments                               Following commands: Intact       Cueing  General Comments   Cueing Techniques: Verbal cues;Tactile cues  ABle to don and doff brace on her own.   Exercises     Shoulder Instructions      Home Living Family/patient expects to be discharged to:: Private residence Living Arrangements: Spouse/significant other Available Help at Discharge: Family;Available 24 hours/day Type of Home: House Home Access: Level entry     Home Layout: Two level;Able to live on main level with bedroom/bathroom Alternate Level Stairs-Number of Steps: Flight   Bathroom Shower/Tub: Producer, television/film/video: Standard Bathroom Accessibility: Yes How Accessible: Accessible via walker Home Equipment: Agricultural consultant (2 wheels);Cane - single point;Shower seat          Prior Functioning/Environment                      OT Problem List: Decreased range of motion;Decreased strength;Decreased activity tolerance;Impaired balance (sitting and/or standing);Pain   OT Treatment/Interventions: Self-care/ADL training;Therapeutic exercise;Neuromuscular education;Therapeutic activities;Patient/family education;Balance training      OT Goals(Current goals can be found in the care plan section)   Acute Rehab OT Goals Patient Stated Goal: To be in less pain OT Goal Formulation: With patient Time For Goal Achievement: 02/18/24 Potential to Achieve Goals: Good   OT Frequency:  Min 1X/week    Co-evaluation              AM-PAC OT "6 Clicks" Daily Activity     Outcome Measure Help from another person eating meals?: None Help from another person taking care of personal grooming?: None Help from another person toileting, which includes using toliet, bedpan, or urinal?: A  Little Help from another person bathing (including washing, rinsing, drying)?: A Little Help from another person to put on and taking off regular upper body clothing?: A Little Help from another person to put on and taking off regular lower body clothing?: A Little 6 Click Score: 20   End of Session Equipment Utilized During Treatment: Rolling walker (2 wheels);Back brace Nurse Communication: Mobility status;Patient requests pain meds  Activity Tolerance: Patient tolerated treatment well Patient left: in bed;with call bell/phone within reach;with nursing/sitter in room;with family/visitor present  OT Visit Diagnosis: Unsteadiness on feet (R26.81);Other abnormalities of gait and mobility (R26.89);Muscle weakness (generalized) (M62.81)                Time: 1610-9604 OT Time Calculation (min): 19 min Charges:  OT General Charges $OT Visit: 1 Visit OT Evaluation $OT Eval Moderate Complexity: 1 Mod  Ivor Messier, OT  Acute Rehabilitation Services Office [phone] Secure chat preferred   Marilynne Drivers 02/04/2024, 11:09 AM

## 2024-02-04 NOTE — Discharge Summary (Signed)
 Patient ID: Morgan Rowland MRN: [id number] DOB/AGE: [birth year] 75 y.o.  Admit date: 02/03/2024 Discharge date: 02/04/2024  Admission Diagnoses: Lumbar radiculopathy [M54.16]   Discharge Diagnoses: Same   Discharged Condition: Stable  Hospital Course:  Morgan Rowland is a 73 y.o. female who was admitted following an uncomplicated LLIF L3-4/ext PSF. They were recovered in PACU and transferred to the floor. Hospital course was uncomplicated. Pt stable for discharge today. Pt to f/u in office for routine post op visit. Pt is in agreement w/ plan.    Discharge Exam: Blood pressure (!) 145/53, pulse (!) 105, temperature 98.4 F (36.9 C), temperature source Oral, resp. rate 16, height 4\' 11"  (1.499 m), weight 74.8 kg, SpO2 100%.   Disposition: Discharge disposition: 01-Home or Self Care       Discharge Instructions     Incentive spirometry RT   Complete by: As directed       Allergies as of 02/04/2024       Reactions   Ciprofloxacin Diarrhea   Sulfa Antibiotics Itching   itch        Medication List     STOP taking these medications    HYDROcodone-acetaminophen 10-325 MG tablet Commonly known as: NORCO       TAKE these medications    atorvastatin 10 MG tablet Commonly known as: LIPITOR Take 10 mg by mouth daily.   baclofen 20 MG tablet Commonly known as: LIORESAL Take 20 mg by mouth 3 (three) times daily.   calcium carbonate 1500 (600 Ca) MG Tabs tablet Commonly known as: OSCAL Take 600 mg of elemental calcium by mouth 2 (two) times daily with a meal.   dicyclomine 10 MG capsule Commonly known as: BENTYL TAKE 1 TO 2 CAPSULES (10-20 MG TOTAL) BY MOUTH EVERY 8 HOURS AS NEEDED FOR SPASMS   docusate sodium 100 MG capsule Commonly known as: COLACE Take 1 capsule (100 mg total) by mouth 2 (two) times daily.   IBgard 90 MG Cpcr Generic drug: Peppermint Oil Take 2 capsules by mouth as needed. Take as directed as needed   lisinopril 20 MG tablet Commonly  known as: ZESTRIL Take 20 mg by mouth daily.   LORazepam 2 MG tablet Commonly known as: ATIVAN Take 2 mg by mouth at bedtime.   LORazepam 0.5 MG tablet Commonly known as: ATIVAN Take 0.5 mg by mouth at bedtime as needed.   multivitamin with minerals tablet Take 1 tablet by mouth daily.   Narcan 4 MG/0.1ML Liqd nasal spray kit Generic drug: naloxone Place 0.4 mg into the nose once.   nortriptyline 10 MG/5ML solution Commonly known as: PAMELOR Take 7.5 mLs (15 mg total) by mouth at bedtime.   omeprazole 40 MG capsule Commonly known as: PRILOSEC Take 40 mg by mouth daily.   omeprazole 20 MG capsule Commonly known as: PRILOSEC Take 1 capsule (20 mg total) by mouth 2 (two) times daily before a meal. Please schedule an Office Visit for further refills. Thank you   Oxycodone HCl 10 MG Tabs Take 1 tablet (10 mg total) by mouth every 4 (four) hours as needed for severe pain (pain score 7-10).   raloxifene 60 MG tablet Commonly known as: EVISTA Take 60 mg by mouth daily.   Tolak 4 % Crea Generic drug: Fluorouracil APPLY TO AFFECTED AREA NIGHTLY x 2 weeks.   valACYclovir 500 MG tablet Commonly known as: VALTREX Take 1 tablet by mouth once daily What changed:  when to take this reasons to take this  Vitamin D3 125 MCG (5000 UT) Caps Take 5,000 Units by mouth in the morning and at bedtime.         Signed: Clovis Riley 02/04/2024, 3:13 PM

## 2024-02-04 NOTE — Progress Notes (Signed)
Pt and family given D/C instructions with verbal understanding. Rx's were sent to the pharmacy by MD. Pt's incision is clean and dry with no sign of infection. Pt's IV was removed prior to D/C. Pt received 3-n-1 from [facility] per MD order. Pt D/C'd home via wheelchair per MD order. Pt is stable @ D/C and has no other needs at this time. Holli Humbles, RN  ?

## 2024-02-04 NOTE — Progress Notes (Signed)
    Providing Compassionate, Quality Care - Together   NEUROSURGERY PROGRESS NOTE     S: No issues overnight.    O: EXAM:  BP (!) 145/53 (BP Location: Left Arm)   Pulse (!) 105   Temp 98.4 F (36.9 C) (Oral)   Resp 16   Ht 4\' 11"  (1.499 m)   Wt 74.8 kg   SpO2 100%   BMI 33.33 kg/m     Awake, alert, oriented  Speech fluent, appropriate  Strength/sensation grossly intact BUE/BLE Dressings c/d/I Ambulating w/o difficulty   ASSESSMENT:  73 y.o. 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   Patrici Ranks, [facility]

## 2024-02-04 NOTE — Evaluation (Signed)
 Physical Therapy Brief Evaluation and Discharge Note Patient Details Name: Morgan Rowland MRN: [id number] DOB: [birth year] Today's Date: 02/04/2024   History of Present Illness  Pt admitted 3/4 for L3-4 anterior/lateral lumbar interbody fusion via left prone transpsoas retroperitoneal approach.PMH:  anxiety, Cervical disc dz, previous Lumbar surgery, IBS, HTN  Clinical Impression  Pt admitted with above diagnosis. Pt was able to ambulate with RW with good safety.  Can verbalize all precautions and follows precautions.  Gave handout with precautions. Pt doesn't need further PT as she is Modif I and all education completed during session.  Should progress well at home. Will sign off.     PT Assessment Patient does not need any further PT services  Assistance Needed at Discharge  PRN    Equipment Recommendations BSC/3in1 (pt given info to get adaptive equipment as well.)  Recommendations for Other Services       Precautions/Restrictions Precautions Precautions: Back Precaution Booklet Issued: Yes (comment) Required Braces or Orthoses: Spinal Brace Spinal Brace: Applied in sitting position Restrictions Weight Bearing Restrictions Per Provider Order: No        Mobility  Bed Mobility Rolling: Independent Supine/Sidelying to sit: Independent Sit to supine/sidelying: Independent General bed mobility comments: uses correct technique for back precautions.  Transfers Overall transfer level: Independent                      Ambulation/Gait Ambulation/Gait assistance: Modified independent (Device/Increase time) Gait Distance (Feet): 250 Feet Assistive device: Rolling walker (2 wheels) Gait Pattern/deviations: WFL(Within Functional Limits) Gait Speed: Below normal General Gait Details: No LOB with challenges. Good safety with rW. Stopped by bathroom and pt independent with bathroom use.  Home Activity Instructions    Stairs Stairs: Yes Stairs assistance:  Supervision Stair Management: Step to pattern, Two rails, Forwards Number of Stairs: 4 General stair comments: Discussed technique with pt and pt verbalizes understanidng and feels confident.  Modified Rankin (Stroke Patients Only)        Balance Overall balance assessment: Mild deficits observed, not formally tested Sitting-balance support: No upper extremity supported, Feet supported Sitting balance-Leahy Scale: Fair     Standing balance support: No upper extremity supported, During functional activity Standing balance-Leahy Scale: Fair            Pertinent Vitals/Pain PT - Brief Vital Signs All Vital Signs Stable: Yes Pain Assessment Pain Assessment: Faces Faces Pain Scale: Hurts even more Pain Location: back Pain Descriptors / Indicators: Aching, Discomfort, Grimacing, Guarding Pain Intervention(s): Limited activity within patient's tolerance, Monitored during session, Repositioned, Premedicated before session     Home Living Family/patient expects to be discharged to:: Private residence Living Arrangements: Spouse/significant other Available Help at Discharge: Family;Available 24 hours/day Home Environment: Stairs to enter;Rail - right;Rail - left  Stairs-Number of Steps: 4 Home Equipment: Rolling Walker (2 wheels);Cane - single point        Prior Function Level of Independence: Independent      UE/LE Assessment   UE ROM/Strength/Tone/Coordination: WFL    LE ROM/Strength/Tone/Coordination: [facility]      Communication   Communication Communication: No apparent difficulties     Cognition Overall Cognitive Status: Appears within functional limits for tasks assessed/performed       General Comments General comments (skin integrity, edema, etc.): ABle to don and doff brace on her own.    Exercises     Assessment/Plan    PT Problem List         PT Visit Diagnosis Muscle weakness (  generalized) (M62.81);Pain    No Skilled PT All education  completed;Patient is modified independent with all activity/mobility;Patient will have necessary level of assist by caregiver at discharge   Co-evaluation                AMPAC 6 Clicks Help needed turning from your back to your side while in a flat bed without using bedrails?: None Help needed moving from lying on your back to sitting on the side of a flat bed without using bedrails?: None Help needed moving to and from a bed to a chair (including a wheelchair)?: None Help needed standing up from a chair using your arms (e.g., wheelchair or bedside chair)?: None Help needed to walk in hospital room?: A Little Help needed climbing 3-5 steps with a railing? : A Little 6 Click Score: 22      End of Session Equipment Utilized During Treatment: Gait belt Activity Tolerance: Patient tolerated treatment well Patient left: in bed;with call bell/phone within reach Nurse Communication: Mobility status PT Visit Diagnosis: Muscle weakness (generalized) (M62.81);Pain Pain - part of body:  (back)     Time: 7846-9629 PT Time Calculation (min) (ACUTE ONLY): 25 min  Charges:   PT Evaluation $PT Eval Low Complexity: 1 Low PT Treatments $Gait Training: 8-22 mins    [facility] M,PT Acute Rehab Services [phone]   Bevelyn Buckles  02/04/2024, 10:37 AM

## 2024-02-26 DIAGNOSIS — Z79899 Other long term (current) drug therapy: Secondary | ICD-10-CM | POA: Diagnosis not present

## 2024-02-26 DIAGNOSIS — M545 Low back pain, unspecified: Secondary | ICD-10-CM | POA: Diagnosis not present

## 2024-02-26 DIAGNOSIS — M4716 Other spondylosis with myelopathy, lumbar region: Secondary | ICD-10-CM | POA: Diagnosis not present

## 2024-02-26 DIAGNOSIS — I10 Essential (primary) hypertension: Secondary | ICD-10-CM | POA: Diagnosis not present

## 2024-02-26 DIAGNOSIS — G8929 Other chronic pain: Secondary | ICD-10-CM | POA: Diagnosis not present

## 2024-02-26 DIAGNOSIS — E6609 Other obesity due to excess calories: Secondary | ICD-10-CM | POA: Diagnosis not present

## 2024-03-01 DIAGNOSIS — Z79899 Other long term (current) drug therapy: Secondary | ICD-10-CM | POA: Diagnosis not present

## 2024-03-20 DIAGNOSIS — M4716 Other spondylosis with myelopathy, lumbar region: Secondary | ICD-10-CM | POA: Diagnosis not present

## 2024-03-20 DIAGNOSIS — I10 Essential (primary) hypertension: Secondary | ICD-10-CM | POA: Diagnosis not present

## 2024-03-20 DIAGNOSIS — Z9181 History of falling: Secondary | ICD-10-CM | POA: Diagnosis not present

## 2024-03-20 DIAGNOSIS — Z6830 Body mass index (BMI) 30.0-30.9, adult: Secondary | ICD-10-CM | POA: Diagnosis not present

## 2024-03-20 DIAGNOSIS — M62830 Muscle spasm of back: Secondary | ICD-10-CM | POA: Diagnosis not present

## 2024-03-20 DIAGNOSIS — Z79899 Other long term (current) drug therapy: Secondary | ICD-10-CM | POA: Diagnosis not present

## 2024-03-20 DIAGNOSIS — E6609 Other obesity due to excess calories: Secondary | ICD-10-CM | POA: Diagnosis not present

## 2024-03-31 DIAGNOSIS — I10 Essential (primary) hypertension: Secondary | ICD-10-CM | POA: Diagnosis not present

## 2024-03-31 DIAGNOSIS — K219 Gastro-esophageal reflux disease without esophagitis: Secondary | ICD-10-CM | POA: Diagnosis not present

## 2024-03-31 DIAGNOSIS — M461 Sacroiliitis, not elsewhere classified: Secondary | ICD-10-CM | POA: Diagnosis not present

## 2024-03-31 DIAGNOSIS — M5416 Radiculopathy, lumbar region: Secondary | ICD-10-CM | POA: Diagnosis not present

## 2024-03-31 DIAGNOSIS — M545 Low back pain, unspecified: Secondary | ICD-10-CM | POA: Diagnosis not present

## 2024-03-31 DIAGNOSIS — Z6827 Body mass index (BMI) 27.0-27.9, adult: Secondary | ICD-10-CM | POA: Diagnosis not present

## 2024-03-31 DIAGNOSIS — F419 Anxiety disorder, unspecified: Secondary | ICD-10-CM | POA: Diagnosis not present

## 2024-04-07 ENCOUNTER — Encounter (HOSPITAL_COMMUNITY): Payer: Self-pay | Admitting: Neurosurgery

## 2024-04-08 DIAGNOSIS — Z1231 Encounter for screening mammogram for malignant neoplasm of breast: Secondary | ICD-10-CM | POA: Diagnosis not present

## 2024-04-14 DIAGNOSIS — M461 Sacroiliitis, not elsewhere classified: Secondary | ICD-10-CM | POA: Diagnosis not present

## 2024-04-19 DIAGNOSIS — Z9181 History of falling: Secondary | ICD-10-CM | POA: Diagnosis not present

## 2024-04-19 DIAGNOSIS — E6609 Other obesity due to excess calories: Secondary | ICD-10-CM | POA: Diagnosis not present

## 2024-04-19 DIAGNOSIS — Z1159 Encounter for screening for other viral diseases: Secondary | ICD-10-CM | POA: Diagnosis not present

## 2024-04-19 DIAGNOSIS — Z131 Encounter for screening for diabetes mellitus: Secondary | ICD-10-CM | POA: Diagnosis not present

## 2024-04-19 DIAGNOSIS — I10 Essential (primary) hypertension: Secondary | ICD-10-CM | POA: Diagnosis not present

## 2024-04-19 DIAGNOSIS — R5383 Other fatigue: Secondary | ICD-10-CM | POA: Diagnosis not present

## 2024-04-19 DIAGNOSIS — Z79899 Other long term (current) drug therapy: Secondary | ICD-10-CM | POA: Diagnosis not present

## 2024-04-19 DIAGNOSIS — D539 Nutritional anemia, unspecified: Secondary | ICD-10-CM | POA: Diagnosis not present

## 2024-04-19 DIAGNOSIS — E559 Vitamin D deficiency, unspecified: Secondary | ICD-10-CM | POA: Diagnosis not present

## 2024-04-19 DIAGNOSIS — M129 Arthropathy, unspecified: Secondary | ICD-10-CM | POA: Diagnosis not present

## 2024-04-19 DIAGNOSIS — M62830 Muscle spasm of back: Secondary | ICD-10-CM | POA: Diagnosis not present

## 2024-04-19 DIAGNOSIS — M4716 Other spondylosis with myelopathy, lumbar region: Secondary | ICD-10-CM | POA: Diagnosis not present

## 2024-04-21 DIAGNOSIS — D225 Melanocytic nevi of trunk: Secondary | ICD-10-CM | POA: Diagnosis not present

## 2024-04-21 DIAGNOSIS — Z1283 Encounter for screening for malignant neoplasm of skin: Secondary | ICD-10-CM | POA: Diagnosis not present

## 2024-04-21 DIAGNOSIS — X32XXXA Exposure to sunlight, initial encounter: Secondary | ICD-10-CM | POA: Diagnosis not present

## 2024-04-21 DIAGNOSIS — L820 Inflamed seborrheic keratosis: Secondary | ICD-10-CM | POA: Diagnosis not present

## 2024-04-21 DIAGNOSIS — D692 Other nonthrombocytopenic purpura: Secondary | ICD-10-CM | POA: Diagnosis not present

## 2024-04-21 DIAGNOSIS — L570 Actinic keratosis: Secondary | ICD-10-CM | POA: Diagnosis not present

## 2024-04-21 DIAGNOSIS — L821 Other seborrheic keratosis: Secondary | ICD-10-CM | POA: Diagnosis not present

## 2024-04-22 DIAGNOSIS — Z79899 Other long term (current) drug therapy: Secondary | ICD-10-CM | POA: Diagnosis not present

## 2024-04-22 DIAGNOSIS — F419 Anxiety disorder, unspecified: Secondary | ICD-10-CM | POA: Diagnosis not present

## 2024-05-05 DIAGNOSIS — Z6833 Body mass index (BMI) 33.0-33.9, adult: Secondary | ICD-10-CM | POA: Diagnosis not present

## 2024-05-05 DIAGNOSIS — M4326 Fusion of spine, lumbar region: Secondary | ICD-10-CM | POA: Diagnosis not present

## 2024-05-05 DIAGNOSIS — M461 Sacroiliitis, not elsewhere classified: Secondary | ICD-10-CM | POA: Diagnosis not present

## 2024-05-17 NOTE — Therapy (Signed)
 OUTPATIENT PHYSICAL THERAPY THORACOLUMBAR EVALUATION   Patient Name: Morgan Rowland MRN: [id number] DOB:1951-09-18, 73 y.o., female Today's Date: 05/18/2024  END OF SESSION:  PT End of Session - 05/18/24 0849     Visit Number 1    Number of Visits 8    Date for PT Re-Evaluation 06/25/24    Authorization Type BCBS Medicare    Authorization Time Period please check auth    PT Start Time 0848    PT Stop Time 0928    PT Time Calculation (min) 40 min    Activity Tolerance Patient tolerated treatment well    Behavior During Therapy [facility] for tasks assessed/performed          Past Medical History:  Diagnosis Date   Anxiety    Atypical mole 08/14/2010   left shin tx mohs (atypical prolif.)   Atypical nevi 08/28/2010   left heel mild/mod. 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 Tawana Fast   Herpes simplex    in her nose   Hypercholesteremia    Hypertension    IBS (irritable bowel syndrome)    Lumbar spondylosis with myelopathy    Lumbosacral pain, chronic    Numbness and tingling of right arm    resolved per patient on 01/28/24   Osteoporosis    Pneumonia    hx x 1   Past Surgical History:  Procedure Laterality Date   ANTERIOR LAT LUMBAR FUSION Left 02/03/2024   Procedure: PRONE LATERAL LUMBAR INTERBODY FUSION LUMBAR THREE-FOUR;  Surgeon: Van Gelinas, MD;  Location: [facility] OR;  Service: Neurosurgery;  Laterality: Left;   APPENDECTOMY     CAROTID ENDARTERECTOMY     pt denies this dx - pt states had parotid gland removed right side-cancer-was contained   CHOLECYSTECTOMY     COLONOSCOPY  2021   x several - last one on 07/23/19   HARDWARE REMOVAL Left 02/03/2024   Procedure: LUMBAR FUSION HARDWARE REMOVAL;  Surgeon: Van Gelinas, MD;   Location: [facility] OR;  Service: Neurosurgery;  Laterality: Left;   KNEE SURGERY Left    Left 12/2016, 08/2017   LUMBAR LAMINECTOMY/DECOMPRESSION MICRODISCECTOMY  02/03/2024   Procedure: LEFT LUMBAR THREE-FOUR MINIMALLY INVASIVE (MIS) MICRODISCECTOMY;  Surgeon: Van Gelinas, MD;  Location: [facility] OR;  Service: Neurosurgery;;   Spinal fusion x 2     TONSILLECTOMY     TOTAL ABDOMINAL HYSTERECTOMY     TRANSFORAMINAL LUMBAR INTERBODY FUSION W/ MIS 1 LEVEL Left 02/03/2024   Procedure: PERCUTANEOUS PLACEMENT OF SCREWS AND EXTENSION OF FUSION TO LUMBAR THREE;  Surgeon: Van Gelinas, MD;  Location: [facility] OR;  Service: Neurosurgery;  Laterality: Left;   UPPER GI ENDOSCOPY  07/23/2019   Patient Active Problem List   Diagnosis Date Noted   Lumbar radiculopathy 02/03/2024   Constipation 04/21/2017   Diverticulitis 03/02/2012   High cholesterol 03/02/2012    PCP: [facility]   REFERRING PROVIDER: Tomlinson, Sara Caylin, PA-C  REFERRING DIAG: M54.16 (ICD-10-CM) - Radiculopathy, lumbar region  Rationale for Evaluation and Treatment: Rehabilitation  THERAPY DIAG:  Low back pain, unspecified back pain laterality, unspecified chronicity, unspecified whether sciatica present - Plan: PT plan of care cert/re-cert  Pain of right  sacroiliac joint - Plan: PT plan of care cert/re-cert  Difficulty in walking, not elsewhere classified - Plan: PT plan of care cert/re-cert  Radiculopathy, lumbar region - Plan: PT plan of care cert/re-cert  ONSET DATE: s/p 02/03/24  SUBJECTIVE:                                                                                                                                                                                           SUBJECTIVE STATEMENT: 02/03/24 had herniated disc repaired per Dr. Andy Bannister L3-4; redid hardware L4-L5; about 3 weeks later developed SI joint pain on the right; had an injection in the SI joint mid May and scheduled for another in August.   Injection did help the pain in the back but not below the knee.  Taking Meloxicam; Gabapentin and Lyrica she has tried but cannot tolerate.    PERTINENT HISTORY:    PAIN:  Are you having pain? Yes: NPRS scale: 0 to 7/10 Pain location: down right leg laterally to ankle Pain description: runs from right side low back to  lower leg Relieving factors: laying down flat on back Aggravation factors:  standing, on her feet  PRECAUTIONS: None  RED FLAGS: None   WEIGHT BEARING RESTRICTIONS: No  FALLS:  Has patient fallen in last 6 months? No  OCCUPATION: not  working  PLOF: Independent  PATIENT GOALS: get the pain gone  NEXT MD VISIT: August when she gets injection  OBJECTIVE:  Note: Objective measures were completed at Evaluation unless otherwise noted.  DIAGNOSTIC FINDINGS:  FINDINGS: Six fluoroscopic spot views lumbar spine submitted from the operating room. Previous L4-L5 fusion. Subsequent L3-L4 fusion with removal of the prior L5 pedicle screws. Fluoroscopy time 2 minutes 54 seconds. Dose 118.92 mGy.   IMPRESSION: Intraoperative fluoroscopy during lumbar spine surgery.  PATIENT SURVEYS:  Modified Oswestry 27/50 54%   COGNITION: Overall cognitive status: Within functional limits for tasks assessed     SENSATION: WFL  MUSCLE LENGTH: Hamstrings: Right  deg; Left  deg  POSTURE: rounded shoulders, forward head, and flexed trunk   PALPATION: Very tender right piriformis and right glute  LUMBAR ROM:   AROM eval  Flexion Fingertips to ankle  Extension 50% available  Right lateral flexion Fingertips to knee joint line  Left lateral flexion Fingertips to knee joint line  Right rotation   Left rotation    (Blank rows = not tested)  LOWER EXTREMITY ROM:     Active  Right eval Left eval  Hip flexion    Hip extension    Hip abduction    Hip adduction    Hip internal rotation  Hip external rotation    Knee flexion    Knee extension    Ankle  dorsiflexion    Ankle plantarflexion    Ankle inversion    Ankle eversion     (Blank rows = not tested)  LOWER EXTREMITY MMT:  *painful  MMT Right eval Left eval  Hip flexion 4* 4+  Hip extension 2* 3+  Hip abduction    Hip adduction    Hip internal rotation    Hip external rotation    Knee flexion 4* cramp 4+  Knee extension 4+ 5  Ankle dorsiflexion 4+ 5  Ankle plantarflexion    Ankle inversion    Ankle eversion     (Blank rows = not tested)  FUNCTIONAL TESTS:  5 times sit to stand: next visit Timed up and go (TUG): next visit  GAIT: Distance walked: 50 ft in clinic Assistive device utilized: None Level of assistance: Modified independence Comments: antalgic gait  TREATMENT DATE: 05/18/24 physical therapy evaluation and HEP instruction; discussion of dry needling                                                                                                                              PATIENT EDUCATION:  Education details: Patient educated on exam findings, POC, scope of PT, HEP, and what to expect next visit; dry needling. Person educated: Patient Education method: Explanation, Demonstration, and Handouts Education comprehension: verbalized understanding, returned demonstration, verbal cues required, and tactile cues required  HOME EXERCISE PROGRAM: Access Code: [email or web address] URL: [email or web address] Date: 05/18/2024 Prepared by: AP - Rehab  Exercises - Hooklying Single Knee to Chest Stretch  - 2 x daily - 7 x weekly - 1 sets - 5 reps - Seated Figure 4 Piriformis Stretch  - 2 x daily - 7 x weekly - 3 sets - 5 reps  ASSESSMENT:  CLINICAL IMPRESSION: Patient is a 73 y.o. female who was seen today for physical therapy evaluation and treatment for M54.16 (ICD-10-CM) - Radiculopathy, lumbar region. Patient demonstrates muscle weakness, reduced ROM, and fascial restrictions which are likely contributing to symptoms of pain and are negatively impacting  patient ability to perform ADLs and functional mobility tasks. Patient will benefit from skilled physical therapy services to address these deficits to reduce pain and improve level of function with ADLs and functional mobility tasks.   OBJECTIVE IMPAIRMENTS: Abnormal gait, decreased mobility, decreased ROM, decreased strength, increased fascial restrictions, impaired perceived functional ability, and pain.   ACTIVITY LIMITATIONS: carrying, lifting, bending, sitting, standing, squatting, sleeping, stairs, bed mobility, locomotion level, and caring for others  PARTICIPATION LIMITATIONS: meal prep, cleaning, laundry, shopping, community activity, and yard work  [facility] POTENTIAL: Good  CLINICAL DECISION MAKING: Evolving/moderate complexity  EVALUATION COMPLEXITY: Moderate   GOALS: Goals reviewed with patient? No  SHORT TERM GOALS: Target date: 06/08/2024  patient will be independent with initial HEP  Baseline: Goal status: INITIAL  2.  Patient will report 50% improvement overall  Baseline:  Goal status: INITIAL   LONG TERM GOALS: Target date: 06/25/2024  Patient will be independent in self management strategies to improve quality of life and functional outcomes.  Baseline:  Goal status: INITIAL  2.  Patient will report 75% improvement overall  Baseline:  Goal status: INITIAL  3.  Patient will improve Modified Oswestry score by 7 points (20/50 or less) to demonstrate improved perceived function   Baseline: 27/50 Goal status: INITIAL  4.   Patient will increase bilateral leg MMT's to 4+ to 5/5 to allow navigation of steps without gait deviation or loss of balance  Baseline: see above Goal status: INITIAL   PLAN:  PT FREQUENCY: 2x/week  PT DURATION: 6 weeks  PLANNED INTERVENTIONS: 97164- PT Re-evaluation, 97110-Therapeutic exercises, 97530- Therapeutic activity, 97112- Neuromuscular re-education, 97535- Self Care, 16109- Manual therapy, [id number]- Gait training,  [phone]- Orthotic Fit/training, [phone]- Canalith repositioning, [id number]- Aquatic Therapy, 97760- Splinting, [id number]- Wound care (first 20 sq cm), 97598- Wound care (each additional 20 sq cm)Patient/Family education, Balance training, Stair training, Taping, Dry Needling, Joint mobilization, Joint manipulation, Spinal manipulation, Spinal mobilization, Scar mobilization, and DME instructions. Aaron Aas  PLAN FOR NEXT SESSION: Review HEP and goals; consider dry needling right piriformis and glute; piriformis stretching, lumbar mobility, decompression exercises   9:38 AM, 05/18/24 Morgan Rowland Morgan Rowland MPT [place] [facility] [place] [phone] Ph:[phone]  [employer] [facility] Authorization Request  Visit Dx Codes: M54.16, M54.50, M53.3. R26.2  Functional Tool Score: Modified Oswestry 27/50 54%  For all possible CPT codes, reference the Planned Interventions line above.     Check all conditions that are expected to impact treatment: {Conditions expected to impact treatment:None of these apply

## 2024-05-18 ENCOUNTER — Ambulatory Visit (HOSPITAL_COMMUNITY): Attending: Surgery

## 2024-05-18 ENCOUNTER — Other Ambulatory Visit: Payer: Self-pay

## 2024-05-18 DIAGNOSIS — M5416 Radiculopathy, lumbar region: Secondary | ICD-10-CM | POA: Insufficient documentation

## 2024-05-18 DIAGNOSIS — R262 Difficulty in walking, not elsewhere classified: Secondary | ICD-10-CM | POA: Insufficient documentation

## 2024-05-18 DIAGNOSIS — M545 Low back pain, unspecified: Secondary | ICD-10-CM | POA: Insufficient documentation

## 2024-05-18 DIAGNOSIS — M533 Sacrococcygeal disorders, not elsewhere classified: Secondary | ICD-10-CM | POA: Insufficient documentation

## 2024-05-18 NOTE — Patient Instructions (Signed)

## 2024-05-19 DIAGNOSIS — M4716 Other spondylosis with myelopathy, lumbar region: Secondary | ICD-10-CM | POA: Diagnosis not present

## 2024-05-19 DIAGNOSIS — M62830 Muscle spasm of back: Secondary | ICD-10-CM | POA: Diagnosis not present

## 2024-05-19 DIAGNOSIS — Z79899 Other long term (current) drug therapy: Secondary | ICD-10-CM | POA: Diagnosis not present

## 2024-05-19 DIAGNOSIS — Z9181 History of falling: Secondary | ICD-10-CM | POA: Diagnosis not present

## 2024-05-19 DIAGNOSIS — E6609 Other obesity due to excess calories: Secondary | ICD-10-CM | POA: Diagnosis not present

## 2024-05-19 DIAGNOSIS — I10 Essential (primary) hypertension: Secondary | ICD-10-CM | POA: Diagnosis not present

## 2024-05-20 ENCOUNTER — Ambulatory Visit (HOSPITAL_COMMUNITY)

## 2024-05-20 DIAGNOSIS — R262 Difficulty in walking, not elsewhere classified: Secondary | ICD-10-CM

## 2024-05-20 DIAGNOSIS — M4716 Other spondylosis with myelopathy, lumbar region: Secondary | ICD-10-CM | POA: Diagnosis not present

## 2024-05-20 DIAGNOSIS — M545 Low back pain, unspecified: Secondary | ICD-10-CM

## 2024-05-20 DIAGNOSIS — M533 Sacrococcygeal disorders, not elsewhere classified: Secondary | ICD-10-CM

## 2024-05-20 DIAGNOSIS — M5416 Radiculopathy, lumbar region: Secondary | ICD-10-CM

## 2024-05-20 DIAGNOSIS — E6609 Other obesity due to excess calories: Secondary | ICD-10-CM | POA: Diagnosis not present

## 2024-05-20 DIAGNOSIS — I10 Essential (primary) hypertension: Secondary | ICD-10-CM | POA: Diagnosis not present

## 2024-05-20 DIAGNOSIS — G4710 Hypersomnia, unspecified: Secondary | ICD-10-CM | POA: Diagnosis not present

## 2024-05-20 NOTE — Therapy (Signed)
 OUTPATIENT PHYSICAL THERAPY THORACOLUMBAR treatment   Patient Name: Morgan Rowland MRN: [id number] DOB:[birth year], 73 y.o., female Today's Date: 05/20/2024  END OF SESSION:  PT End of Session - 05/20/24 1512     Visit Number 2    Number of Visits 8    Date for PT Re-Evaluation 06/25/24    Authorization Type BCBS Medicare    Authorization Time Period please check auth    PT Start Time 1435    PT Stop Time 1508    PT Time Calculation (min) 33 min    Activity Tolerance Patient tolerated treatment well    Behavior During Therapy [facility] for tasks assessed/performed          Past Medical History:  Diagnosis Date   Anxiety    Atypical mole 08/14/2010   left shin tx mohs (atypical prolif.)   Atypical nevi 08/28/2010   left heel mild/mod. 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 Tawana Fast   Herpes simplex    in her nose   Hypercholesteremia    Hypertension    IBS (irritable bowel syndrome)    Lumbar spondylosis with myelopathy    Lumbosacral pain, chronic    Numbness and tingling of right arm    resolved per patient on 01/28/24   Osteoporosis    Pneumonia    hx x 1   Past Surgical History:  Procedure Laterality Date   ANTERIOR LAT LUMBAR FUSION Left 02/03/2024   Procedure: PRONE LATERAL LUMBAR INTERBODY FUSION LUMBAR THREE-FOUR;  Surgeon: Van Gelinas, MD;  Location: [facility] OR;  Service: Neurosurgery;  Laterality: Left;   APPENDECTOMY     CAROTID ENDARTERECTOMY     pt denies this dx - pt states had parotid gland removed right side-cancer-was contained   CHOLECYSTECTOMY     COLONOSCOPY  2021   x several - last one on 07/23/19   HARDWARE REMOVAL Left 02/03/2024   Procedure: LUMBAR FUSION HARDWARE REMOVAL;  Surgeon: Van Gelinas, MD;   Location: [facility] OR;  Service: Neurosurgery;  Laterality: Left;   KNEE SURGERY Left    Left 12/2016, 08/2017   LUMBAR LAMINECTOMY/DECOMPRESSION MICRODISCECTOMY  02/03/2024   Procedure: LEFT LUMBAR THREE-FOUR MINIMALLY INVASIVE (MIS) MICRODISCECTOMY;  Surgeon: Van Gelinas, MD;  Location: [facility] OR;  Service: Neurosurgery;;   Spinal fusion x 2     TONSILLECTOMY     TOTAL ABDOMINAL HYSTERECTOMY     TRANSFORAMINAL LUMBAR INTERBODY FUSION W/ MIS 1 LEVEL Left 02/03/2024   Procedure: PERCUTANEOUS PLACEMENT OF SCREWS AND EXTENSION OF FUSION TO LUMBAR THREE;  Surgeon: Van Gelinas, MD;  Location: [facility] OR;  Service: Neurosurgery;  Laterality: Left;   UPPER GI ENDOSCOPY  07/23/2019   Patient Active Problem List   Diagnosis Date Noted   Lumbar radiculopathy 02/03/2024   Constipation 04/21/2017   Diverticulitis 03/02/2012   High cholesterol 03/02/2012    PCP: [facility]   REFERRING PROVIDER: Tomlinson, Sara Caylin, PA-C  REFERRING DIAG: M54.16 (ICD-10-CM) - Radiculopathy, lumbar region  Rationale for Evaluation and Treatment: Rehabilitation  THERAPY DIAG:  Low back pain, unspecified back pain laterality, unspecified chronicity, unspecified whether sciatica present  Pain of right sacroiliac joint  Difficulty in walking, not  elsewhere classified  Radiculopathy, lumbar region  ONSET DATE: s/p 02/03/24  SUBJECTIVE:                                                                                                                                                                                           SUBJECTIVE STATEMENT: Pain not going as far down her leg today; ready to try dry needling    Eval:02/03/24 had herniated disc repaired per Dr. Andy Bannister L3-4; redid hardware L4-L5; about 3 weeks later developed SI joint pain on the right; had an injection in the SI joint mid May and scheduled for another in August.  Injection did help the pain in the back but not below the knee.  Taking  Meloxicam; Gabapentin and Lyrica she has tried but cannot tolerate.    PERTINENT HISTORY:    PAIN:  Are you having pain? Yes: NPRS scale: 0 to 7/10 Pain location: down right leg laterally to ankle Pain description: runs from right side low back to  lower leg Relieving factors: laying down flat on back Aggravation factors:  standing, on her feet  PRECAUTIONS: None  RED FLAGS: None   WEIGHT BEARING RESTRICTIONS: No  FALLS:  Has patient fallen in last 6 months? No  OCCUPATION: not  working  PLOF: Independent  PATIENT GOALS: get the pain gone  NEXT MD VISIT: August when she gets injection  OBJECTIVE:  Note: Objective measures were completed at Evaluation unless otherwise noted.  DIAGNOSTIC FINDINGS:  FINDINGS: Six fluoroscopic spot views lumbar spine submitted from the operating room. Previous L4-L5 fusion. Subsequent L3-L4 fusion with removal of the prior L5 pedicle screws. Fluoroscopy time 2 minutes 54 seconds. Dose 118.92 mGy.   IMPRESSION: Intraoperative fluoroscopy during lumbar spine surgery.  PATIENT SURVEYS:  Modified Oswestry 27/50 54%   COGNITION: Overall cognitive status: Within functional limits for tasks assessed     SENSATION: WFL  MUSCLE LENGTH: Hamstrings: Right  deg; Left  deg  POSTURE: rounded shoulders, forward head, and flexed trunk   PALPATION: Very tender right piriformis and right glute  LUMBAR ROM:   AROM eval  Flexion Fingertips to ankle  Extension 50% available  Right lateral flexion Fingertips to knee joint line  Left lateral flexion Fingertips to knee joint line  Right rotation   Left rotation    (Blank rows = not tested)  LOWER EXTREMITY ROM:     Active  Right eval Left eval  Hip flexion    Hip extension    Hip abduction    Hip adduction    Hip internal rotation    Hip external rotation    Knee flexion  Knee extension    Ankle dorsiflexion    Ankle plantarflexion    Ankle inversion    Ankle eversion      (Blank rows = not tested)  LOWER EXTREMITY MMT:  *painful  MMT Right eval Left eval  Hip flexion 4* 4+  Hip extension 2* 3+  Hip abduction    Hip adduction    Hip internal rotation    Hip external rotation    Knee flexion 4* cramp 4+  Knee extension 4+ 5  Ankle dorsiflexion 4+ 5  Ankle plantarflexion    Ankle inversion    Ankle eversion     (Blank rows = not tested)  FUNCTIONAL TESTS:  5 times sit to stand: next visit Timed up and go (TUG): next visit  GAIT: Distance walked: 50 ft in clinic Assistive device utilized: None Level of assistance: Modified independence Comments: antalgic gait  TREATMENT DATE:  05/20/24 Trigger Point Dry Needling  Initial Treatment: Pt instructed on Dry Needling rational, procedures, and possible side effects. Pt instructed to expect mild to moderate muscle soreness later in the day and/or into the next day.  Pt instructed in methods to reduce muscle soreness. Pt instructed to continue prescribed HEP. Because Dry Needling was performed over or adjacent to a lung field, pt was educated on S/S of pneumothorax and to seek immediate medical attention should they occur.  Patient was educated on signs and symptoms of infection and other risk factors and advised to seek medical attention should they occur.  Patient verbalized understanding of these instructions and education.   Patient Verbal Consent Given: Yes Education Handout Provided: Previously Provided Muscles Treated: right glute; right piriformis Electrical Stimulation Performed: No Treatment Response/Outcome: twitch; soreness after needling    05/18/24 physical therapy evaluation and HEP instruction; discussion of dry needling                                                                                                                              PATIENT EDUCATION:  Education details: Patient educated on exam findings, POC, scope of PT, HEP, and what to expect next visit; dry  needling. Person educated: Patient Education method: Explanation, Demonstration, and Handouts Education comprehension: verbalized understanding, returned demonstration, verbal cues required, and tactile cues required  HOME EXERCISE PROGRAM: Access Code: [email or web address] URL: [email or web address] Date: 05/18/2024 Prepared by: AP - Rehab  Exercises - Hooklying Single Knee to Chest Stretch  - 2 x daily - 7 x weekly - 1 sets - 5 reps - Seated Figure 4 Piriformis Stretch  - 2 x daily - 7 x weekly - 3 sets - 5 reps  ASSESSMENT:  CLINICAL IMPRESSION: Patient with no questions regarding dry needling or with HEP.  Trial of dry needling today with multiple tender spots on palpation right glute and piriformis.  Patient walking with a cane today.  Discussed with patient self care after needling.  Patient will benefit from continued skilled therapy services to address deficits  and promote return to optimal function.      Eval: Patient is a 73 y.o. female who was seen today for physical therapy evaluation and treatment for M54.16 (ICD-10-CM) - Radiculopathy, lumbar region. Patient demonstrates muscle weakness, reduced ROM, and fascial restrictions which are likely contributing to symptoms of pain and are negatively impacting patient ability to perform ADLs and functional mobility tasks. Patient will benefit from skilled physical therapy services to address these deficits to reduce pain and improve level of function with ADLs and functional mobility tasks.   OBJECTIVE IMPAIRMENTS: Abnormal gait, decreased mobility, decreased ROM, decreased strength, increased fascial restrictions, impaired perceived functional ability, and pain.   ACTIVITY LIMITATIONS: carrying, lifting, bending, sitting, standing, squatting, sleeping, stairs, bed mobility, locomotion level, and caring for others  PARTICIPATION LIMITATIONS: meal prep, cleaning, laundry, shopping, community activity, and yard work  [facility]  POTENTIAL: Good  CLINICAL DECISION MAKING: Evolving/moderate complexity  EVALUATION COMPLEXITY: Moderate   GOALS: Goals reviewed with patient? No  SHORT TERM GOALS: Target date: 06/08/2024  patient will be independent with initial HEP  Baseline: Goal status: INITIAL  2.  Patient will report 50% improvement overall  Baseline:  Goal status: INITIAL   LONG TERM GOALS: Target date: 06/25/2024  Patient will be independent in self management strategies to improve quality of life and functional outcomes.  Baseline:  Goal status: INITIAL  2.  Patient will report 75% improvement overall  Baseline:  Goal status: INITIAL  3.  Patient will improve Modified Oswestry score by 7 points (20/50 or less) to demonstrate improved perceived function   Baseline: 27/50 Goal status: INITIAL  4.   Patient will increase bilateral leg MMT's to 4+ to 5/5 to allow navigation of steps without gait deviation or loss of balance  Baseline: see above Goal status: INITIAL   PLAN:  PT FREQUENCY: 2x/week  PT DURATION: 6 weeks  PLANNED INTERVENTIONS: 97164- PT Re-evaluation, 97110-Therapeutic exercises, 97530- Therapeutic activity, 97112- Neuromuscular re-education, 97535- Self Care, 04540- Manual therapy, [id number]- Gait training, [phone]- Orthotic Fit/training, [phone]- Canalith repositioning, [id number]- Aquatic Therapy, 97760- Splinting, [id number]- Wound care (first 20 sq cm), 97598- Wound care (each additional 20 sq cm)Patient/Family education, Balance training, Stair training, Taping, Dry Needling, Joint mobilization, Joint manipulation, Spinal manipulation, Spinal mobilization, Scar mobilization, and DME instructions. Aaron Aas  PLAN FOR NEXT SESSION: Review HEP and goals; consider dry needling right piriformis and glute; piriformis stretching, lumbar mobility, decompression exercises   3:16 PM, 05/20/24 Laconda Basich Small Mersadies Petree MPT [place] [facility] [place] [phone] Ph:[phone]  [employer] [facility] Authorization Request  Visit Dx Codes: M54.16, M54.50, M53.3. R26.2  Functional Tool Score: Modified Oswestry 27/50 54%  For all possible CPT codes, reference the Planned Interventions line above.     Check all conditions that are expected to impact treatment: {Conditions expected to impact treatment:None of these apply

## 2024-06-08 DIAGNOSIS — G4752 REM sleep behavior disorder: Secondary | ICD-10-CM | POA: Diagnosis not present

## 2024-06-08 DIAGNOSIS — R0683 Snoring: Secondary | ICD-10-CM | POA: Diagnosis not present

## 2024-06-10 ENCOUNTER — Ambulatory Visit (HOSPITAL_COMMUNITY): Attending: Surgery

## 2024-06-10 DIAGNOSIS — M5416 Radiculopathy, lumbar region: Secondary | ICD-10-CM | POA: Insufficient documentation

## 2024-06-10 DIAGNOSIS — M533 Sacrococcygeal disorders, not elsewhere classified: Secondary | ICD-10-CM | POA: Insufficient documentation

## 2024-06-10 DIAGNOSIS — M545 Low back pain, unspecified: Secondary | ICD-10-CM | POA: Insufficient documentation

## 2024-06-10 DIAGNOSIS — R262 Difficulty in walking, not elsewhere classified: Secondary | ICD-10-CM | POA: Insufficient documentation

## 2024-06-10 NOTE — Therapy (Signed)
 OUTPATIENT PHYSICAL THERAPY THORACOLUMBAR treatment   Patient Name: Morgan Rowland MRN: [id number] DOB:1951/04/20, 73 y.o., female Today's Date: 06/10/2024  END OF SESSION:  PT End of Session - 06/10/24 1155     Visit Number 3    Number of Visits 8    Date for PT Re-Evaluation 06/25/24    Authorization Type [employer] Medicare    Authorization Time Period please check auth    PT Start Time 1150    PT Stop Time 1230    PT Time Calculation (min) 40 min    Activity Tolerance Patient tolerated treatment well    Behavior During Therapy [facility] for tasks assessed/performed          Past Medical History:  Diagnosis Date   Anxiety    Atypical mole 08/14/2010   left shin tx mohs (atypical prolif.)   Atypical nevi 08/28/2010   left heel mild/mod. 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   Hypercholesteremia    Hypertension    IBS (irritable bowel syndrome)    Lumbar spondylosis with myelopathy    Lumbosacral pain, chronic    Numbness and tingling of right arm    resolved per patient on 01/28/24   Osteoporosis    Pneumonia    hx x 1   Past Surgical History:  Procedure Laterality Date   ANTERIOR LAT LUMBAR FUSION Left 02/03/2024   Procedure: PRONE LATERAL LUMBAR INTERBODY FUSION LUMBAR THREE-FOUR;  Surgeon: Debby Dorn MATSU, MD;  Location: [facility] OR;  Service: Neurosurgery;  Laterality: Left;   APPENDECTOMY     CAROTID ENDARTERECTOMY     pt denies this dx - pt states had parotid gland removed right side-cancer-was contained   CHOLECYSTECTOMY     COLONOSCOPY  2021   x several - last one on 07/23/19   HARDWARE REMOVAL Left 02/03/2024   Procedure: LUMBAR FUSION HARDWARE REMOVAL;  Surgeon: Debby Dorn MATSU, MD;   Location: [facility] OR;  Service: Neurosurgery;  Laterality: Left;   KNEE SURGERY Left    Left 12/2016, 08/2017   LUMBAR LAMINECTOMY/DECOMPRESSION MICRODISCECTOMY  02/03/2024   Procedure: LEFT LUMBAR THREE-FOUR MINIMALLY INVASIVE (MIS) MICRODISCECTOMY;  Surgeon: Debby Dorn MATSU, MD;  Location: [facility] OR;  Service: Neurosurgery;;   Spinal fusion x 2     TONSILLECTOMY     TOTAL ABDOMINAL HYSTERECTOMY     TRANSFORAMINAL LUMBAR INTERBODY FUSION W/ MIS 1 LEVEL Left 02/03/2024   Procedure: PERCUTANEOUS PLACEMENT OF SCREWS AND EXTENSION OF FUSION TO LUMBAR THREE;  Surgeon: Debby Dorn MATSU, MD;  Location: [facility] OR;  Service: Neurosurgery;  Laterality: Left;   UPPER GI ENDOSCOPY  07/23/2019   Patient Active Problem List   Diagnosis Date Noted   Lumbar radiculopathy 02/03/2024   Constipation 04/21/2017   Diverticulitis 03/02/2012   High cholesterol 03/02/2012    PCP: [facility]   REFERRING PROVIDER: Tomlinson, Sara Caylin, PA-C  REFERRING DIAG: M54.16 (ICD-10-CM) - Radiculopathy, lumbar region  Rationale for Evaluation and Treatment: Rehabilitation  THERAPY DIAG:  Low back pain, unspecified back pain laterality, unspecified chronicity, unspecified whether sciatica present  Pain of right sacroiliac joint  Difficulty in walking, not  elsewhere classified  Radiculopathy, lumbar region  ONSET DATE: s/p 02/03/24  SUBJECTIVE:                                                                                                                                                                                           SUBJECTIVE STATEMENT: Reports significant improvement since dry needling; arrives without a cane.     Eval:02/03/24 had herniated disc repaired per Dr. Debby L3-4; redid hardware L4-L5; about 3 weeks later developed SI joint pain on the right; had an injection in the SI joint mid May and scheduled for another in August.  Injection did help the pain in the back but not below the knee.   Taking Meloxicam; Gabapentin and Lyrica she has tried but cannot tolerate.    PERTINENT HISTORY:    PAIN:  Are you having pain? Yes: NPRS scale: 0 to 7/10 Pain location: down right leg laterally to ankle Pain description: runs from right side low back to  lower leg Relieving factors: laying down flat on back Aggravation factors:  standing, on her feet  PRECAUTIONS: None  RED FLAGS: None   WEIGHT BEARING RESTRICTIONS: No  FALLS:  Has patient fallen in last 6 months? No  OCCUPATION: not  working  PLOF: Independent  PATIENT GOALS: get the pain gone  NEXT MD VISIT: August when she gets injection  OBJECTIVE:  Note: Objective measures were completed at Evaluation unless otherwise noted.  DIAGNOSTIC FINDINGS:  FINDINGS: Six fluoroscopic spot views lumbar spine submitted from the operating room. Previous L4-L5 fusion. Subsequent L3-L4 fusion with removal of the prior L5 pedicle screws. Fluoroscopy time 2 minutes 54 seconds. Dose 118.92 mGy.   IMPRESSION: Intraoperative fluoroscopy during lumbar spine surgery.  PATIENT SURVEYS:  Modified Oswestry 27/50 54%   COGNITION: Overall cognitive status: Within functional limits for tasks assessed     SENSATION: WFL  MUSCLE LENGTH: Hamstrings: Right  deg; Left  deg  POSTURE: rounded shoulders, forward head, and flexed trunk   PALPATION: Very tender right piriformis and right glute  LUMBAR ROM:   AROM eval  Flexion Fingertips to ankle  Extension 50% available  Right lateral flexion Fingertips to knee joint line  Left lateral flexion Fingertips to knee joint line  Right rotation   Left rotation    (Blank rows = not tested)  LOWER EXTREMITY ROM:     Active  Right eval Left eval  Hip flexion    Hip extension    Hip abduction    Hip adduction    Hip internal rotation    Hip external rotation    Knee flexion    Knee  extension    Ankle dorsiflexion    Ankle plantarflexion    Ankle inversion    Ankle  eversion     (Blank rows = not tested)  LOWER EXTREMITY MMT:  *painful  MMT Right eval Left eval  Hip flexion 4* 4+  Hip extension 2* 3+  Hip abduction    Hip adduction    Hip internal rotation    Hip external rotation    Knee flexion 4* cramp 4+  Knee extension 4+ 5  Ankle dorsiflexion 4+ 5  Ankle plantarflexion    Ankle inversion    Ankle eversion     (Blank rows = not tested)  FUNCTIONAL TESTS:  5 times sit to stand: next visit Timed up and go (TUG): next visit  GAIT: Distance walked: 50 ft in clinic Assistive device utilized: None Level of assistance: Modified independence Comments: antalgic gait  TREATMENT DATE:  06/10/24 Seated abdominal bracing 5 hold x 5 Update HEP Trigger Point Dry Needling  Subsequent Treatment: Instructions provided previously at initial dry needling treatment.   Patient Verbal Consent Given: Yes Education Handout Provided: Previously Provided Muscles Treated: right glute and piriformis, right peroneals Electrical Stimulation Performed: No Treatment Response/Outcome: twitch right peroneal; increased gait speed.     05/20/24 Trigger Point Dry Needling  Initial Treatment: Pt instructed on Dry Needling rational, procedures, and possible side effects. Pt instructed to expect mild to moderate muscle soreness later in the day and/or into the next day.  Pt instructed in methods to reduce muscle soreness. Pt instructed to continue prescribed HEP. Because Dry Needling was performed over or adjacent to a lung field, pt was educated on S/S of pneumothorax and to seek immediate medical attention should they occur.  Patient was educated on signs and symptoms of infection and other risk factors and advised to seek medical attention should they occur.  Patient verbalized understanding of these instructions and education.   Patient Verbal Consent Given: Yes Education Handout Provided: Previously Provided Muscles Treated: right glute; right  piriformis Electrical Stimulation Performed: No Treatment Response/Outcome: twitch; soreness after needling    05/18/24 physical therapy evaluation and HEP instruction; discussion of dry needling                                                                                                                              PATIENT EDUCATION:  Education details: Patient educated on exam findings, POC, scope of PT, HEP, and what to expect next visit; dry needling. Person educated: Patient Education method: Explanation, Demonstration, and Handouts Education comprehension: verbalized understanding, returned demonstration, verbal cues required, and tactile cues required  HOME EXERCISE PROGRAM: Access Code: [email or web address] URL: [email or web address] Date: 05/18/2024 Prepared by: [facility] - Rehab  Exercises - Hooklying Single Knee to Chest Stretch  - 2 x daily - 7 x weekly - 1 sets - 5 reps - Seated Figure 4 Piriformis Stretch  - 2 x daily - 7 x weekly - 3 sets -  5 reps  ASSESSMENT:  CLINICAL IMPRESSION: Patient with no questions regarding dry needling or with HEP.  Able to walk in PT gym without AD today.  Continued with dry needling to address trigger points right glute and piriformis; noted overall less tenderness since last treatment.  Right peroneals are tight and tender so also needled there with 2 twitches.   Patient will benefit from continued skilled therapy services to address deficits and promote return to optimal function.      Eval: Patient is a 73 y.o. female who was seen today for physical therapy evaluation and treatment for M54.16 (ICD-10-CM) - Radiculopathy, lumbar region. Patient demonstrates muscle weakness, reduced ROM, and fascial restrictions which are likely contributing to symptoms of pain and are negatively impacting patient ability to perform ADLs and functional mobility tasks. Patient will benefit from skilled physical therapy services to address these deficits to  reduce pain and improve level of function with ADLs and functional mobility tasks.   OBJECTIVE IMPAIRMENTS: Abnormal gait, decreased mobility, decreased ROM, decreased strength, increased fascial restrictions, impaired perceived functional ability, and pain.   ACTIVITY LIMITATIONS: carrying, lifting, bending, sitting, standing, squatting, sleeping, stairs, bed mobility, locomotion level, and caring for others  PARTICIPATION LIMITATIONS: meal prep, cleaning, laundry, shopping, community activity, and yard work  [facility] POTENTIAL: Good  CLINICAL DECISION MAKING: Evolving/moderate complexity  EVALUATION COMPLEXITY: Moderate   GOALS: Goals reviewed with patient? No  SHORT TERM GOALS: Target date: 06/08/2024  patient will be independent with initial HEP  Baseline: Goal status: INITIAL  2.  Patient will report 50% improvement overall  Baseline:  Goal status: INITIAL   LONG TERM GOALS: Target date: 06/25/2024  Patient will be independent in self management strategies to improve quality of life and functional outcomes.  Baseline:  Goal status: INITIAL  2.  Patient will report 75% improvement overall  Baseline:  Goal status: INITIAL  3.  Patient will improve Modified Oswestry score by 7 points (20/50 or less) to demonstrate improved perceived function   Baseline: 27/50 Goal status: INITIAL  4.   Patient will increase bilateral leg MMT's to 4+ to 5/5 to allow navigation of steps without gait deviation or loss of balance  Baseline: see above Goal status: INITIAL   PLAN:  PT FREQUENCY: 2x/week  PT DURATION: 6 weeks  PLANNED INTERVENTIONS: 97164- PT Re-evaluation, 97110-Therapeutic exercises, 97530- Therapeutic activity, 97112- Neuromuscular re-education, 97535- Self Care, [id number]- Manual therapy, [id number]- Gait training, [phone]- Orthotic Fit/training, [phone]- Canalith repositioning, [id number]- Aquatic Therapy, 97760- Splinting, [id number]- Wound care (first 20 sq cm), 97598- Wound  care (each additional 20 sq cm)Patient/Family education, Balance training, Stair training, Taping, Dry Needling, Joint mobilization, Joint manipulation, Spinal manipulation, Spinal mobilization, Scar mobilization, and DME instructions. SABRA  PLAN FOR NEXT SESSION: Review HEP and goals; consider dry needling right piriformis and glute; piriformis stretching, lumbar mobility, decompression exercises   12:23 PM, 06/10/24 Halynn Reitano Small Joab Carden MPT [place] [facility] [place] [phone] [phone]  [employer] [facility] Authorization Request  Visit Dx Codes: M54.16, M54.50, M53.3. R26.2  Functional Tool Score: Modified Oswestry 27/50 54%  For all possible CPT codes, reference the Planned Interventions line above.     Check all conditions that are expected to impact treatment: {Conditions expected to impact treatment:None of these apply

## 2024-06-11 ENCOUNTER — Encounter (HOSPITAL_COMMUNITY)

## 2024-06-14 DIAGNOSIS — K5792 Diverticulitis of intestine, part unspecified, without perforation or abscess without bleeding: Secondary | ICD-10-CM | POA: Diagnosis not present

## 2024-06-14 DIAGNOSIS — Z6828 Body mass index (BMI) 28.0-28.9, adult: Secondary | ICD-10-CM | POA: Diagnosis not present

## 2024-06-15 NOTE — Therapy (Signed)
 OUTPATIENT PHYSICAL THERAPY THORACOLUMBAR treatment   Patient Name: Morgan Rowland MRN: [id number] DOB:1950/12/30, 73 y.o., female Today's Date: 06/16/2024  END OF SESSION:  PT End of Session - 06/16/24 1505     Visit Number 4    Number of Visits 8    Date for PT Re-Evaluation 06/25/24    Authorization Type BCBS Medicare    Authorization Time Period no auth required    PT Start Time 1430    PT Stop Time 1503    PT Time Calculation (min) 33 min    Activity Tolerance Patient tolerated treatment well    Behavior During Therapy [facility] for tasks assessed/performed           Past Medical History:  Diagnosis Date   Anxiety    Atypical mole 08/14/2010   left shin tx mohs (atypical prolif.)   Atypical nevi 08/28/2010   left heel mild/mod. 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   Hypercholesteremia    Hypertension    IBS (irritable bowel syndrome)    Lumbar spondylosis with myelopathy    Lumbosacral pain, chronic    Numbness and tingling of right arm    resolved per patient on 01/28/24   Osteoporosis    Pneumonia    hx x 1   Past Surgical History:  Procedure Laterality Date   ANTERIOR LAT LUMBAR FUSION Left 02/03/2024   Procedure: PRONE LATERAL LUMBAR INTERBODY FUSION LUMBAR THREE-FOUR;  Surgeon: Debby Dorn MATSU, MD;  Location: [facility] OR;  Service: Neurosurgery;  Laterality: Left;   APPENDECTOMY     CAROTID ENDARTERECTOMY     pt denies this dx - pt states had parotid gland removed right side-cancer-was contained   CHOLECYSTECTOMY     COLONOSCOPY  2021   x several - last one on 07/23/19   HARDWARE REMOVAL Left 02/03/2024   Procedure: LUMBAR FUSION HARDWARE REMOVAL;  Surgeon: Debby Dorn MATSU, MD;   Location: [facility] OR;  Service: Neurosurgery;  Laterality: Left;   KNEE SURGERY Left    Left 12/2016, 08/2017   LUMBAR LAMINECTOMY/DECOMPRESSION MICRODISCECTOMY  02/03/2024   Procedure: LEFT LUMBAR THREE-FOUR MINIMALLY INVASIVE (MIS) MICRODISCECTOMY;  Surgeon: Debby Dorn MATSU, MD;  Location: [facility] OR;  Service: Neurosurgery;;   Spinal fusion x 2     TONSILLECTOMY     TOTAL ABDOMINAL HYSTERECTOMY     TRANSFORAMINAL LUMBAR INTERBODY FUSION W/ MIS 1 LEVEL Left 02/03/2024   Procedure: PERCUTANEOUS PLACEMENT OF SCREWS AND EXTENSION OF FUSION TO LUMBAR THREE;  Surgeon: Debby Dorn MATSU, MD;  Location: [facility] OR;  Service: Neurosurgery;  Laterality: Left;   UPPER GI ENDOSCOPY  07/23/2019   Patient Active Problem List   Diagnosis Date Noted   Lumbar radiculopathy 02/03/2024   Constipation 04/21/2017   Diverticulitis 03/02/2012   High cholesterol 03/02/2012    PCP: [facility]   REFERRING PROVIDER: Tomlinson, Sara Caylin, PA-C  REFERRING DIAG: M54.16 (ICD-10-CM) - Radiculopathy, lumbar region  Rationale for Evaluation and Treatment: Rehabilitation  THERAPY DIAG:  Pain of right sacroiliac joint  Low back pain, unspecified back pain laterality, unspecified chronicity, unspecified whether sciatica present  Difficulty in walking,  not elsewhere classified  ONSET DATE: s/p 02/03/24  SUBJECTIVE:                                                                                                                                                                                           SUBJECTIVE STATEMENT: Reports she has been doing well with hip and her leg is a little achy but otherwise good. Doing exercises.    Eval:02/03/24 had herniated disc repaired per Dr. Debby L3-4; redid hardware L4-L5; about 3 weeks later developed SI joint pain on the right; had an injection in the SI joint mid May and scheduled for another in August.  Injection did help the pain in the back but not below the knee.   Taking Meloxicam; Gabapentin and Lyrica she has tried but cannot tolerate.    PERTINENT HISTORY:    PAIN:  Are you having pain? Yes: NPRS scale: 0 to 7/10 Pain location: down right leg laterally to ankle Pain description: runs from right side low back to  lower leg Relieving factors: laying down flat on back Aggravation factors:  standing, on her feet  PRECAUTIONS: None  RED FLAGS: None   WEIGHT BEARING RESTRICTIONS: No  FALLS:  Has patient fallen in last 6 months? No  OCCUPATION: not  working  PLOF: Independent  PATIENT GOALS: get the pain gone  NEXT MD VISIT: August when she gets injection  OBJECTIVE:  Note: Objective measures were completed at Evaluation unless otherwise noted.  DIAGNOSTIC FINDINGS:  FINDINGS: Six fluoroscopic spot views lumbar spine submitted from the operating room. Previous L4-L5 fusion. Subsequent L3-L4 fusion with removal of the prior L5 pedicle screws. Fluoroscopy time 2 minutes 54 seconds. Dose 118.92 mGy.   IMPRESSION: Intraoperative fluoroscopy during lumbar spine surgery.  PATIENT SURVEYS:  Modified Oswestry 27/50 54%   COGNITION: Overall cognitive status: Within functional limits for tasks assessed     SENSATION: WFL  MUSCLE LENGTH: Hamstrings: Right  deg; Left  deg  POSTURE: rounded shoulders, forward head, and flexed trunk   PALPATION: Very tender right piriformis and right glute  LUMBAR ROM:   AROM eval  Flexion Fingertips to ankle  Extension 50% available  Right lateral flexion Fingertips to knee joint line  Left lateral flexion Fingertips to knee joint line  Right rotation   Left rotation    (Blank rows = not tested)  LOWER EXTREMITY ROM:     Active  Right eval Left eval  Hip flexion    Hip extension    Hip abduction    Hip adduction    Hip internal rotation    Hip external rotation  Knee flexion    Knee extension    Ankle dorsiflexion    Ankle plantarflexion    Ankle inversion    Ankle  eversion     (Blank rows = not tested)  LOWER EXTREMITY MMT:  *painful  MMT Right eval Left eval  Hip flexion 4* 4+  Hip extension 2* 3+  Hip abduction    Hip adduction    Hip internal rotation    Hip external rotation    Knee flexion 4* cramp 4+  Knee extension 4+ 5  Ankle dorsiflexion 4+ 5  Ankle plantarflexion    Ankle inversion    Ankle eversion     (Blank rows = not tested)  FUNCTIONAL TESTS:  5 times sit to stand: next visit Timed up and go (TUG): next visit  GAIT: Distance walked: 50 ft in clinic Assistive device utilized: None Level of assistance: Modified independence Comments: antalgic gait  TREATMENT DATE:  06/16/24 Trigger Point Dry Needling Instructions provided previously at initial dry needling treatment. Patient Verbal Consent Given: Yes Education Handout Provided: Previously Provided Muscles Treated: Right peroneals, R tibialis anterior  Electrical Stimulation Performed: No Treatment Response/Outcome: twitch right peroneal; increased gait speed.  ThAct Education on HEP ankle circles (performed for demo) 20 CW, 20 CCW; Seated ABC w/ R LE  Education on STM / rolling of peroneals/calf as needed  06/10/24 Seated abdominal bracing 5 hold x 5 Update HEP Trigger Point Dry Needling  Subsequent Treatment: Instructions provided previously at initial dry needling treatment.   Patient Verbal Consent Given: Yes Education Handout Provided: Previously Provided Muscles Treated: right glute and piriformis, right peroneals Electrical Stimulation Performed: No Treatment Response/Outcome: twitch right peroneal; increased gait speed.  05/20/24 Trigger Point Dry Needling  Initial Treatment: Pt instructed on Dry Needling rational, procedures, and possible side effects. Pt instructed to expect mild to moderate muscle soreness later in the day and/or into the next day.  Pt instructed in methods to reduce muscle soreness. Pt instructed to continue prescribed  HEP. Because Dry Needling was performed over or adjacent to a lung field, pt was educated on S/S of pneumothorax and to seek immediate medical attention should they occur.  Patient was educated on signs and symptoms of infection and other risk factors and advised to seek medical attention should they occur.  Patient verbalized understanding of these instructions and education.   Patient Verbal Consent Given: Yes Education Handout Provided: Previously Provided Muscles Treated: right glute; right piriformis Electrical Stimulation Performed: No Treatment Response/Outcome: twitch; soreness after needling  05/18/24 physical therapy evaluation and HEP instruction; discussion of dry needling                                                                                                                             PATIENT EDUCATION:  Education details: Patient educated on exam findings, POC, scope of PT, HEP, and what to expect next visit; dry needling. Person educated: Patient Education method: Explanation, Demonstration, and  Handouts Education comprehension: verbalized understanding, returned demonstration, verbal cues required, and tactile cues required  HOME EXERCISE PROGRAM: Access Code: [email or web address] URL: [email or web address] Date: 05/18/2024 Prepared by: [facility] - Rehab  Exercises - Hooklying Single Knee to Chest Stretch  - 2 x daily - 7 x weekly - 1 sets - 5 reps - Seated Figure 4 Piriformis Stretch  - 2 x daily - 7 x weekly - 3 sets - 5 reps  ASSESSMENT:  CLINICAL IMPRESSION: Patient with no questions regarding dry needling or with HEP.  Demonstrates improved gait and tolerance to increased mobility and pressure upon completion of DN as well as understanding with continued need for HEP compliance and use of STM/rolling of peroneals for increasing mm pliability relaxation as needed to alleviate/mitigate soreness with understanding noted. Discussed potential for d/c next session  pending response and progression with independence in HEP/soreness management. Patient will benefit from continued skilled therapy services to address deficits and promote return to optimal function.      Eval: Patient is a 73 y.o. female who was seen today for physical therapy evaluation and treatment for M54.16 (ICD-10-CM) - Radiculopathy, lumbar region. Patient demonstrates muscle weakness, reduced ROM, and fascial restrictions which are likely contributing to symptoms of pain and are negatively impacting patient ability to perform ADLs and functional mobility tasks. Patient will benefit from skilled physical therapy services to address these deficits to reduce pain and improve level of function with ADLs and functional mobility tasks.   OBJECTIVE IMPAIRMENTS: Abnormal gait, decreased mobility, decreased ROM, decreased strength, increased fascial restrictions, impaired perceived functional ability, and pain.   ACTIVITY LIMITATIONS: carrying, lifting, bending, sitting, standing, squatting, sleeping, stairs, bed mobility, locomotion level, and caring for others  PARTICIPATION LIMITATIONS: meal prep, cleaning, laundry, shopping, community activity, and yard work  [facility] POTENTIAL: Good  CLINICAL DECISION MAKING: Evolving/moderate complexity  EVALUATION COMPLEXITY: Moderate   GOALS: Goals reviewed with patient? No  SHORT TERM GOALS: Target date: 06/08/2024  patient will be independent with initial HEP  Baseline: Goal status: INITIAL  2.  Patient will report 50% improvement overall  Baseline:  Goal status: INITIAL   LONG TERM GOALS: Target date: 06/25/2024  Patient will be independent in self management strategies to improve quality of life and functional outcomes.  Baseline:  Goal status: INITIAL  2.  Patient will report 75% improvement overall  Baseline:  Goal status: INITIAL  3.  Patient will improve Modified Oswestry score by 7 points (20/50 or less) to demonstrate  improved perceived function   Baseline: 27/50 Goal status: INITIAL  4.   Patient will increase bilateral leg MMT's to 4+ to 5/5 to allow navigation of steps without gait deviation or loss of balance  Baseline: see above Goal status: INITIAL   PLAN:  PT FREQUENCY: 2x/week  PT DURATION: 6 weeks  PLANNED INTERVENTIONS: [id number]- PT Re-evaluation, 97110-Therapeutic exercises, 97530- Therapeutic activity, 97112- Neuromuscular re-education, 97535- Self Care, [id number]- Manual therapy, [id number]- Gait training, [phone]- Orthotic Fit/training, [phone]- Canalith repositioning, [id number]- Aquatic Therapy, 97760- Splinting, [id number]- Wound care (first 20 sq cm), 97598- Wound care (each additional 20 sq cm)Patient/Family education, Balance training, Stair training, Taping, Dry Needling, Joint mobilization, Joint manipulation, Spinal manipulation, Spinal mobilization, Scar mobilization, and DME instructions. SABRA  PLAN FOR NEXT SESSION: Review HEP and goals; consider dry needling right piriformis and glute; piriformis stretching, lumbar mobility, decompression exercises   3:06 PM, 06/16/24 Lamarr LITTIE Citrin PT, DPT [facility] [phone] office

## 2024-06-16 ENCOUNTER — Ambulatory Visit (HOSPITAL_COMMUNITY)

## 2024-06-16 DIAGNOSIS — M545 Low back pain, unspecified: Secondary | ICD-10-CM

## 2024-06-16 DIAGNOSIS — R262 Difficulty in walking, not elsewhere classified: Secondary | ICD-10-CM | POA: Diagnosis not present

## 2024-06-16 DIAGNOSIS — M533 Sacrococcygeal disorders, not elsewhere classified: Secondary | ICD-10-CM | POA: Diagnosis not present

## 2024-06-16 DIAGNOSIS — M5416 Radiculopathy, lumbar region: Secondary | ICD-10-CM | POA: Diagnosis not present

## 2024-06-18 ENCOUNTER — Encounter (HOSPITAL_COMMUNITY)

## 2024-06-18 DIAGNOSIS — Z79899 Other long term (current) drug therapy: Secondary | ICD-10-CM | POA: Diagnosis not present

## 2024-06-18 DIAGNOSIS — I10 Essential (primary) hypertension: Secondary | ICD-10-CM | POA: Diagnosis not present

## 2024-06-18 DIAGNOSIS — M4716 Other spondylosis with myelopathy, lumbar region: Secondary | ICD-10-CM | POA: Diagnosis not present

## 2024-06-18 DIAGNOSIS — E6609 Other obesity due to excess calories: Secondary | ICD-10-CM | POA: Diagnosis not present

## 2024-06-18 DIAGNOSIS — Z9181 History of falling: Secondary | ICD-10-CM | POA: Diagnosis not present

## 2024-06-21 ENCOUNTER — Ambulatory Visit (HOSPITAL_COMMUNITY)

## 2024-06-21 DIAGNOSIS — R262 Difficulty in walking, not elsewhere classified: Secondary | ICD-10-CM

## 2024-06-21 DIAGNOSIS — M5416 Radiculopathy, lumbar region: Secondary | ICD-10-CM | POA: Diagnosis not present

## 2024-06-21 DIAGNOSIS — M533 Sacrococcygeal disorders, not elsewhere classified: Secondary | ICD-10-CM

## 2024-06-21 DIAGNOSIS — M545 Low back pain, unspecified: Secondary | ICD-10-CM

## 2024-06-21 NOTE — Therapy (Signed)
 OUTPATIENT PHYSICAL THERAPY THORACOLUMBAR DISCHARGE PHYSICAL THERAPY DISCHARGE SUMMARY  Visits from Start of Care: 5  Current functional level related to goals / functional outcomes: See below   Remaining deficits: See below   Education / Equipment: HEP   Patient agrees to discharge. Patient goals were met. Patient is being discharged due to meeting the stated rehab goals.    Patient Name: Morgan Rowland MRN: [id number] DOB:Apr 30, 1951, 73 y.o., female Today's Date: 06/21/2024  END OF SESSION:  PT End of Session - 06/21/24 0929     Visit Number 5    Number of Visits 8    Date for PT Re-Evaluation 06/25/24    Authorization Type BCBS Medicare    Authorization Time Period no auth required    PT Start Time 0931    PT Stop Time 1010    PT Time Calculation (min) 39 min    Activity Tolerance Patient tolerated treatment well    Behavior During Therapy [facility] for tasks assessed/performed           Past Medical History:  Diagnosis Date   Anxiety    Atypical mole 08/14/2010   left shin tx mohs (atypical prolif.)   Atypical nevi 08/28/2010   left heel mild/mod. 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   Hypercholesteremia    Hypertension    IBS (irritable bowel syndrome)    Lumbar spondylosis with myelopathy    Lumbosacral pain, chronic    Numbness and tingling of right arm    resolved per patient on 01/28/24   Osteoporosis    Pneumonia    hx x 1   Past Surgical History:  Procedure Laterality Date   ANTERIOR LAT LUMBAR FUSION Left 02/03/2024   Procedure: PRONE LATERAL LUMBAR INTERBODY FUSION LUMBAR THREE-FOUR;  Surgeon: Debby Dorn MATSU, MD;  Location: [facility] OR;  Service: Neurosurgery;   Laterality: Left;   APPENDECTOMY     CAROTID ENDARTERECTOMY     pt denies this dx - pt states had parotid gland removed right side-cancer-was contained   CHOLECYSTECTOMY     COLONOSCOPY  2021   x several - last one on 07/23/19   HARDWARE REMOVAL Left 02/03/2024   Procedure: LUMBAR FUSION HARDWARE REMOVAL;  Surgeon: Debby Dorn MATSU, MD;  Location: [facility] OR;  Service: Neurosurgery;  Laterality: Left;   KNEE SURGERY Left    Left 12/2016, 08/2017   LUMBAR LAMINECTOMY/DECOMPRESSION MICRODISCECTOMY  02/03/2024   Procedure: LEFT LUMBAR THREE-FOUR MINIMALLY INVASIVE (MIS) MICRODISCECTOMY;  Surgeon: Debby Dorn MATSU, MD;  Location: [facility] OR;  Service: Neurosurgery;;   Spinal fusion x 2     TONSILLECTOMY     TOTAL ABDOMINAL HYSTERECTOMY     TRANSFORAMINAL LUMBAR INTERBODY FUSION W/ MIS 1 LEVEL Left 02/03/2024   Procedure: PERCUTANEOUS PLACEMENT OF SCREWS AND EXTENSION OF FUSION TO LUMBAR THREE;  Surgeon: Debby Dorn MATSU, MD;  Location: [facility] OR;  Service: Neurosurgery;  Laterality: Left;   UPPER GI ENDOSCOPY  07/23/2019   Patient Active Problem List   Diagnosis Date Noted   Lumbar radiculopathy 02/03/2024   Constipation 04/21/2017   Diverticulitis 03/02/2012   High cholesterol 03/02/2012  PCP: [facility]   REFERRING PROVIDER: Tomlinson, Sara Caylin, PA-C  REFERRING DIAG: M54.16 (ICD-10-CM) - Radiculopathy, lumbar region  Rationale for Evaluation and Treatment: Rehabilitation  THERAPY DIAG:  Pain of right sacroiliac joint  Low back pain, unspecified back pain laterality, unspecified chronicity, unspecified whether sciatica present  Difficulty in walking, not elsewhere classified  Radiculopathy, lumbar region  ONSET DATE: s/p 02/03/24  SUBJECTIVE:                                                                                                                                                                                           SUBJECTIVE STATEMENT:  About 90% better  overall   Eval:02/03/24 had herniated disc repaired per Dr. Debby L3-4; redid hardware L4-L5; about 3 weeks later developed SI joint pain on the right; had an injection in the SI joint mid May and scheduled for another in August.  Injection did help the pain in the back but not below the knee.  Taking Meloxicam; Gabapentin and Lyrica she has tried but cannot tolerate.    PERTINENT HISTORY:    PAIN:  Are you having pain? Yes: NPRS scale: 0 to 7/10 Pain location: down right leg laterally to ankle Pain description: runs from right side low back to  lower leg Relieving factors: laying down flat on back Aggravation factors:  standing, on her feet  PRECAUTIONS: None  RED FLAGS: None   WEIGHT BEARING RESTRICTIONS: No  FALLS:  Has patient fallen in last 6 months? No  OCCUPATION: not  working  PLOF: Independent  PATIENT GOALS: get the pain gone  NEXT MD VISIT: August when she gets injection  OBJECTIVE:  Note: Objective measures were completed at Evaluation unless otherwise noted.  DIAGNOSTIC FINDINGS:  FINDINGS: Six fluoroscopic spot views lumbar spine submitted from the operating room. Previous L4-L5 fusion. Subsequent L3-L4 fusion with removal of the prior L5 pedicle screws. Fluoroscopy time 2 minutes 54 seconds. Dose 118.92 mGy.   IMPRESSION: Intraoperative fluoroscopy during lumbar spine surgery.  PATIENT SURVEYS:  Modified Oswestry 27/50 54%   COGNITION: Overall cognitive status: Within functional limits for tasks assessed     SENSATION: WFL  MUSCLE LENGTH: Hamstrings: Right  deg; Left  deg  POSTURE: rounded shoulders, forward head, and flexed trunk   PALPATION: Very tender right piriformis and right glute  LUMBAR ROM:   AROM eval  Flexion Fingertips to ankle  Extension 50% available  Right lateral flexion Fingertips to knee joint line  Left lateral flexion Fingertips to knee joint line  Right rotation   Left rotation    (Blank rows = not  tested)  LOWER EXTREMITY  ROM:     Active  Right eval Left eval  Hip flexion    Hip extension    Hip abduction    Hip adduction    Hip internal rotation    Hip external rotation    Knee flexion    Knee extension    Ankle dorsiflexion    Ankle plantarflexion    Ankle inversion    Ankle eversion     (Blank rows = not tested)  LOWER EXTREMITY MMT:  *painful  MMT Right eval Left eval Right 06/21/24 Left 06/21/24  Hip flexion 4* 4+ 4+ 5  Hip extension 2* 3+ 4+ 4+  Hip abduction      Hip adduction      Hip internal rotation      Hip external rotation      Knee flexion 4* cramp 4+ 4+ 4+  Knee extension 4+ 5 4+ 5  Ankle dorsiflexion 4+ 5 5 5   Ankle plantarflexion      Ankle inversion      Ankle eversion       (Blank rows = not tested)  FUNCTIONAL TESTS:  5 times sit to stand: next visit Timed up and go (TUG): next visit  GAIT: Distance walked: 50 ft in clinic Assistive device utilized: None Level of assistance: Modified independence Comments: antalgic gait  TREATMENT DATE:  06/21/24 Discharge visit Modified Oswestry 14/50 28% MMT's see above Trigger Point Dry Needling  Subsequent Treatment: Instructions provided previously at initial dry needling treatment.   Patient Verbal Consent Given: Yes Education Handout Provided: Previously Provided Muscles Treated: glutes, piriformis, right peroneal Electrical Stimulation Performed: No Treatment Response/Outcome: overall increased strength, function and decrease pain    06/16/24 Trigger Point Dry Needling Instructions provided previously at initial dry needling treatment. Patient Verbal Consent Given: Yes Education Handout Provided: Previously Provided Muscles Treated: Right peroneals, R tibialis anterior  Electrical Stimulation Performed: No Treatment Response/Outcome: twitch right peroneal; increased gait speed.  ThAct Education on HEP ankle circles (performed for demo) 20 CW, 20 CCW; Seated ABC w/ R LE   Education on STM / rolling of peroneals/calf as needed  06/10/24 Seated abdominal bracing 5 hold x 5 Update HEP Trigger Point Dry Needling  Subsequent Treatment: Instructions provided previously at initial dry needling treatment.   Patient Verbal Consent Given: Yes Education Handout Provided: Previously Provided Muscles Treated: right glute and piriformis, right peroneals Electrical Stimulation Performed: No Treatment Response/Outcome: twitch right peroneal; increased gait speed.  05/20/24 Trigger Point Dry Needling  Initial Treatment: Pt instructed on Dry Needling rational, procedures, and possible side effects. Pt instructed to expect mild to moderate muscle soreness later in the day and/or into the next day.  Pt instructed in methods to reduce muscle soreness. Pt instructed to continue prescribed HEP. Because Dry Needling was performed over or adjacent to a lung field, pt was educated on S/S of pneumothorax and to seek immediate medical attention should they occur.  Patient was educated on signs and symptoms of infection and other risk factors and advised to seek medical attention should they occur.  Patient verbalized understanding of these instructions and education.   Patient Verbal Consent Given: Yes Education Handout Provided: Previously Provided Muscles Treated: right glute; right piriformis Electrical Stimulation Performed: No Treatment Response/Outcome: twitch; soreness after needling  05/18/24 physical therapy evaluation and HEP instruction; discussion of dry needling  PATIENT EDUCATION:  Education details: Patient educated on exam findings, POC, scope of PT, HEP, and what to expect next visit; dry needling. Person educated: Patient Education method: Explanation, Demonstration, and Handouts Education comprehension: verbalized understanding,  returned demonstration, verbal cues required, and tactile cues required  HOME EXERCISE PROGRAM: Access Code: [email or web address] URL: [email or web address] Date: 05/18/2024 Prepared by: AP - Rehab  Exercises - Hooklying Single Knee to Chest Stretch  - 2 x daily - 7 x weekly - 1 sets - 5 reps - Seated Figure 4 Piriformis Stretch  - 2 x daily - 7 x weekly - 3 sets - 5 reps  ASSESSMENT:  CLINICAL IMPRESSION: Progress note today as patient is feeling 90% better.  Patient with good improvement with all objective measures and has met all set rehab goals.  She is agreeable to discharge at this time.     Eval: Patient is a 73 y.o. female who was seen today for physical therapy evaluation and treatment for M54.16 (ICD-10-CM) - Radiculopathy, lumbar region. Patient demonstrates muscle weakness, reduced ROM, and fascial restrictions which are likely contributing to symptoms of pain and are negatively impacting patient ability to perform ADLs and functional mobility tasks. Patient will benefit from skilled physical therapy services to address these deficits to reduce pain and improve level of function with ADLs and functional mobility tasks.   OBJECTIVE IMPAIRMENTS: Abnormal gait, decreased mobility, decreased ROM, decreased strength, increased fascial restrictions, impaired perceived functional ability, and pain.   ACTIVITY LIMITATIONS: carrying, lifting, bending, sitting, standing, squatting, sleeping, stairs, bed mobility, locomotion level, and caring for others  PARTICIPATION LIMITATIONS: meal prep, cleaning, laundry, shopping, community activity, and yard work  [facility] POTENTIAL: Good  CLINICAL DECISION MAKING: Evolving/moderate complexity  EVALUATION COMPLEXITY: Moderate   GOALS: Goals reviewed with patient? No  SHORT TERM GOALS: Target date: 06/08/2024  patient will be independent with initial HEP  Baseline: Goal status: met  2.  Patient will report 50% improvement  overall  Baseline:  Goal status: met   LONG TERM GOALS: Target date: 06/25/2024  Patient will be independent in self management strategies to improve quality of life and functional outcomes.  Baseline:  Goal status: met  2.  Patient will report 75% improvement overall  Baseline:  Goal status: met  3.  Patient will improve Modified Oswestry score by 7 points (20/50 or less) to demonstrate improved perceived function   Baseline: 27/50; 14/50 06/21/24 Goal status: met  4.   Patient will increase bilateral leg MMT's to 4+ to 5/5 to allow navigation of steps without gait deviation or loss of balance  Baseline: see above Goal status: met   PLAN:  PT FREQUENCY: 2x/week  PT DURATION: 6 weeks  PLANNED INTERVENTIONS: 97164- PT Re-evaluation, 97110-Therapeutic exercises, 97530- Therapeutic activity, 97112- Neuromuscular re-education, 97535- Self Care, 02859- Manual therapy, [id number]- Gait training, [phone]- Orthotic Fit/training, [phone]- Canalith repositioning, [id number]- Aquatic Therapy, 97760- Splinting, [id number]- Wound care (first 20 sq cm), 97598- Wound care (each additional 20 sq cm)Patient/Family education, Balance training, Stair training, Taping, Dry Needling, Joint mobilization, Joint manipulation, Spinal manipulation, Spinal mobilization, Scar mobilization, and DME instructions. SABRA  PLAN FOR NEXT SESSION: discharge 10:06 AM, 06/21/24 Ariyah Sedlack Small Davidjames Blansett MPT [facility] [place] [phone]

## 2024-06-22 DIAGNOSIS — R0683 Snoring: Secondary | ICD-10-CM | POA: Diagnosis not present

## 2024-06-22 DIAGNOSIS — I10 Essential (primary) hypertension: Secondary | ICD-10-CM | POA: Diagnosis not present

## 2024-06-22 DIAGNOSIS — G894 Chronic pain syndrome: Secondary | ICD-10-CM | POA: Diagnosis not present

## 2024-06-22 DIAGNOSIS — G4710 Hypersomnia, unspecified: Secondary | ICD-10-CM | POA: Diagnosis not present

## 2024-06-22 DIAGNOSIS — M4716 Other spondylosis with myelopathy, lumbar region: Secondary | ICD-10-CM | POA: Diagnosis not present

## 2024-06-23 DIAGNOSIS — K080 Exfoliation of teeth due to systemic causes: Secondary | ICD-10-CM | POA: Diagnosis not present

## 2024-06-23 DIAGNOSIS — Z79899 Other long term (current) drug therapy: Secondary | ICD-10-CM | POA: Diagnosis not present

## 2024-06-24 ENCOUNTER — Encounter (HOSPITAL_COMMUNITY)

## 2024-06-29 ENCOUNTER — Encounter (HOSPITAL_COMMUNITY)

## 2024-06-30 DIAGNOSIS — Z6827 Body mass index (BMI) 27.0-27.9, adult: Secondary | ICD-10-CM | POA: Diagnosis not present

## 2024-06-30 DIAGNOSIS — F419 Anxiety disorder, unspecified: Secondary | ICD-10-CM | POA: Diagnosis not present

## 2024-06-30 DIAGNOSIS — I10 Essential (primary) hypertension: Secondary | ICD-10-CM | POA: Diagnosis not present

## 2024-07-01 ENCOUNTER — Encounter (HOSPITAL_COMMUNITY)

## 2024-07-14 DIAGNOSIS — I10 Essential (primary) hypertension: Secondary | ICD-10-CM | POA: Diagnosis not present

## 2024-07-19 DIAGNOSIS — Z79899 Other long term (current) drug therapy: Secondary | ICD-10-CM | POA: Diagnosis not present

## 2024-07-19 DIAGNOSIS — I10 Essential (primary) hypertension: Secondary | ICD-10-CM | POA: Diagnosis not present

## 2024-07-19 DIAGNOSIS — Z9181 History of falling: Secondary | ICD-10-CM | POA: Diagnosis not present

## 2024-07-19 DIAGNOSIS — E6609 Other obesity due to excess calories: Secondary | ICD-10-CM | POA: Diagnosis not present

## 2024-07-19 DIAGNOSIS — M4716 Other spondylosis with myelopathy, lumbar region: Secondary | ICD-10-CM | POA: Diagnosis not present

## 2024-07-19 DIAGNOSIS — M62830 Muscle spasm of back: Secondary | ICD-10-CM | POA: Diagnosis not present

## 2024-07-19 DIAGNOSIS — R7303 Prediabetes: Secondary | ICD-10-CM | POA: Diagnosis not present

## 2024-07-28 DIAGNOSIS — M461 Sacroiliitis, not elsewhere classified: Secondary | ICD-10-CM | POA: Diagnosis not present

## 2024-07-28 DIAGNOSIS — Z6832 Body mass index (BMI) 32.0-32.9, adult: Secondary | ICD-10-CM | POA: Diagnosis not present

## 2024-08-16 DIAGNOSIS — H18513 Endothelial corneal dystrophy, bilateral: Secondary | ICD-10-CM | POA: Diagnosis not present

## 2024-08-16 DIAGNOSIS — H2512 Age-related nuclear cataract, left eye: Secondary | ICD-10-CM | POA: Diagnosis not present

## 2024-08-16 DIAGNOSIS — H04123 Dry eye syndrome of bilateral lacrimal glands: Secondary | ICD-10-CM | POA: Diagnosis not present

## 2024-08-16 DIAGNOSIS — H1045 Other chronic allergic conjunctivitis: Secondary | ICD-10-CM | POA: Diagnosis not present

## 2024-08-18 DIAGNOSIS — R7303 Prediabetes: Secondary | ICD-10-CM | POA: Diagnosis not present

## 2024-08-18 DIAGNOSIS — E6609 Other obesity due to excess calories: Secondary | ICD-10-CM | POA: Diagnosis not present

## 2024-08-18 DIAGNOSIS — Z79899 Other long term (current) drug therapy: Secondary | ICD-10-CM | POA: Diagnosis not present

## 2024-08-18 DIAGNOSIS — I10 Essential (primary) hypertension: Secondary | ICD-10-CM | POA: Diagnosis not present

## 2024-08-18 DIAGNOSIS — Z9181 History of falling: Secondary | ICD-10-CM | POA: Diagnosis not present

## 2024-08-18 DIAGNOSIS — M62830 Muscle spasm of back: Secondary | ICD-10-CM | POA: Diagnosis not present

## 2024-08-18 DIAGNOSIS — M4716 Other spondylosis with myelopathy, lumbar region: Secondary | ICD-10-CM | POA: Diagnosis not present

## 2024-08-27 DIAGNOSIS — H25812 Combined forms of age-related cataract, left eye: Secondary | ICD-10-CM | POA: Diagnosis not present

## 2024-08-27 NOTE — Therapy (Signed)
 OUTPATIENT PHYSICAL THERAPY THORACOLUMBAR EVALUATION   Patient Name: Morgan Rowland MRN: [id number] DOB:[birth year], 73 y.o., female Today's Date: 08/27/2024  END OF SESSION:   Past Medical History:  Diagnosis Date   Anxiety    Atypical mole 08/14/2010   left shin tx mohs (atypical prolif.)   Atypical nevi 08/28/2010   left heel mild/mod. 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   Hypercholesteremia    Hypertension    IBS (irritable bowel syndrome)    Lumbar spondylosis with myelopathy    Lumbosacral pain, chronic    Numbness and tingling of right arm    resolved per patient on 01/28/24   Osteoporosis    Pneumonia    hx x 1   Past Surgical History:  Procedure Laterality Date   ANTERIOR LAT LUMBAR FUSION Left 02/03/2024   Procedure: PRONE LATERAL LUMBAR INTERBODY FUSION LUMBAR THREE-FOUR;  Surgeon: Debby Dorn MATSU, MD;  Location: [facility] OR;  Service: Neurosurgery;  Laterality: Left;   APPENDECTOMY     CAROTID ENDARTERECTOMY     pt denies this dx - pt states had parotid gland removed right side-cancer-was contained   CHOLECYSTECTOMY     COLONOSCOPY  2021   x several - last one on 07/23/19   HARDWARE REMOVAL Left 02/03/2024   Procedure: LUMBAR FUSION HARDWARE REMOVAL;  Surgeon: Debby Dorn MATSU, MD;  Location: [facility] OR;  Service: Neurosurgery;  Laterality: Left;   KNEE SURGERY Left    Left 12/2016, 08/2017   LUMBAR LAMINECTOMY/DECOMPRESSION MICRODISCECTOMY  02/03/2024   Procedure: LEFT LUMBAR THREE-FOUR MINIMALLY INVASIVE (MIS) MICRODISCECTOMY;  Surgeon: Debby Dorn MATSU, MD;  Location: [facility] OR;  Service: Neurosurgery;;   Spinal fusion x 2     TONSILLECTOMY     TOTAL ABDOMINAL HYSTERECTOMY      TRANSFORAMINAL LUMBAR INTERBODY FUSION W/ MIS 1 LEVEL Left 02/03/2024   Procedure: PERCUTANEOUS PLACEMENT OF SCREWS AND EXTENSION OF FUSION TO LUMBAR THREE;  Surgeon: Debby Dorn MATSU, MD;  Location: [facility] OR;  Service: Neurosurgery;  Laterality: Left;   UPPER GI ENDOSCOPY  07/23/2019   Patient Active Problem List   Diagnosis Date Noted   Lumbar radiculopathy 02/03/2024   Constipation 04/21/2017   Diverticulitis 03/02/2012   High cholesterol 03/02/2012    PCP: [facility]  REFERRING PROVIDER: Tomlinson, Sara Caylin, PA-C  REFERRING DIAG: M46.1 (ICD-10-CM) - Sacroiliitis, not elsewhere classified  Rationale for Evaluation and Treatment: Rehabilitation  THERAPY DIAG:  No diagnosis found.  ONSET DATE: 02/03/24  SUBJECTIVE:  SUBJECTIVE STATEMENT: Last visit here 06/21/24; returns with pain although not as bad as her first visit last time; had an injection left SI earlier today.    Last eval:05/18/24 Eval:02/03/24 had herniated disc repaired per Dr. Debby L3-4; redid hardware L4-L5; about 3 weeks later developed SI joint pain on the right; had an injection in the SI joint mid May and scheduled for another in August. Injection did help the pain in the back but not below the knee. Taking Meloxicam; Gabapentin and Lyrica she has tried but cannot tolerate.   PERTINENT HISTORY:  Here previously; last visit here 06/21/24 PAIN:  Are you having pain? Yes: NPRS scale: 0 to 7/10 Pain location: down right leg laterally to ankle Pain description: runs from right side low back to  lower leg Relieving factors: laying down flat on back Aggravation factors:  standing, on her feet   PRECAUTIONS: None   RED FLAGS: None      WEIGHT BEARING RESTRICTIONS: No   FALLS:  Has patient fallen in last 6 months? No   OCCUPATION:  not  working   PLOF: Independent   PATIENT GOALS: get the pain gone   NEXT MD VISIT: August when she gets injection   OBJECTIVE:  Note: Objective measures were completed at Evaluation unless otherwise noted.   DIAGNOSTIC FINDINGS:  FINDINGS: Six fluoroscopic spot views lumbar spine submitted from the operating room. Previous L4-L5 fusion. Subsequent L3-L4 fusion with removal of the prior L5 pedicle screws. Fluoroscopy time 2 minutes 54 seconds. Dose 118.92 mGy.   IMPRESSION: Intraoperative fluoroscopy during lumbar spine surgery.   COGNITION: Overall cognitive status: Within functional limits for tasks assessed                          SENSATION: WFL   MUSCLE LENGTH: Hamstrings: Right  deg; Left  deg   POSTURE: rounded shoulders, forward head, and flexed trunk    PALPATION: Very tender right piriformis and right glute   LUMBAR ROM:    AROM eval  Flexion Fingertips to ankle  Extension 50% available  Right lateral flexion Fingertips to knee joint line  Left lateral flexion Fingertips to knee joint line  Right rotation    Left rotation     (Blank rows = not tested)   LOWER EXTREMITY ROM:      Active  Right eval Left eval  Hip flexion      Hip extension      Hip abduction      Hip adduction      Hip internal rotation      Hip external rotation      Knee flexion      Knee extension      Ankle dorsiflexion      Ankle plantarflexion      Ankle inversion      Ankle eversion       (Blank rows = not tested)   LOWER EXTREMITY MMT:  *painful   MMT   Right 06/21/24 Left 06/21/24  Hip flexion   4+ 5  Hip extension   4+ 4+  Hip abduction        Hip adduction        Hip internal rotation        Hip external rotation        Knee flexion   4+ 4+  Knee extension   4+ 5  Ankle dorsiflexion   5 5  Ankle plantarflexion  Ankle inversion        Ankle eversion         (Blank rows = not tested)   FUNCTIONAL TESTS:  5 times sit to stand: 11.63 sec no UE  assist   GAIT: Distance walked: 50 ft in clinic Assistive device utilized: None Level of assistance: Modified independence Comments: antalgic gait    PATIENT SURVEYS:  Modified Oswestry:  MODIFIED OSWESTRY DISABILITY SCALE  Date: 08/30/24 Score                                Total 21/50; 42%   Interpretation of scores: Score Category Description  0-20% Minimal Disability The patient can cope with most living activities. Usually no treatment is indicated apart from advice on lifting, sitting and exercise  21-40% Moderate Disability The patient experiences more pain and difficulty with sitting, lifting and standing. Travel and social life are more difficult and they may be disabled from work. Personal care, sexual activity and sleeping are not grossly affected, and the patient can usually be managed by conservative means  41-60% Severe Disability Pain remains the main problem in this group, but activities of daily living are affected. These patients require a detailed investigation  61-80% Crippled Back pain impinges on all aspects of the patient's life. Positive intervention is required  81-100% Bed-bound  These patients are either bed-bound or exaggerating their symptoms  Bluford FORBES Zoe DELENA Karon DELENA, et al. Surgery versus conservative management of stable thoracolumbar fracture: the PRESTO feasibility RCT. [place] ([place]): [employer]; 2021 Nov. [facility] Technology Assessment, No. 25.62.) Appendix 3, Oswestry Disability Index category descriptors. Available from: FindJewel[employer].cz  Minimally Clinically Important Difference (MCID) = 12.8%    FUNCTIONAL TESTS:  5 times sit to stand: 11.63 sec    TREATMENT DATE: 08/30/24 physical therapy evaluation; HEP; dry needling Trigger Point Dry Needling  Subsequent Treatment: Instructions provided previously at initial dry needling treatment.   Patient Verbal Consent Given: Yes Education  Handout Provided: Previously Provided Muscles Treated: right glute; right peroneal Electrical Stimulation Performed: No Treatment Response/Outcome: twitch right peroneal; looser after needling                                                                                                                                   PATIENT EDUCATION:  Education details: Patient educated on exam findings, POC, scope of PT, HEP, and what to expect next visit. Person educated: Patient Education method: Explanation, Demonstration, and Handouts Education comprehension: verbalized understanding, returned demonstration, verbal cues required, and tactile cues required  HOME EXERCISE PROGRAM: Access Code: [email or web address] URL: [email or web address] Date: 08/30/2024 Prepared by: AP - Rehab  Exercises - Seated Piriformis Stretch with Trunk Bend  - 1 x daily - 7 x weekly - 1 sets - 5 reps - 20 sec hold - Seated Hamstring Stretch  - 1 x daily - 7  x weekly - 1 sets - 5 reps - 20 sec hold  ASSESSMENT:  CLINICAL IMPRESSION: Patient is a 73 y.o. female who was seen today for physical therapy evaluation and treatment for M46.1 (ICD-10-CM) - Sacroiliitis, not elsewhere classified. Patient demonstrates muscle weakness, reduced ROM, and fascial restrictions which are likely contributing to symptoms of pain and are negatively impacting patient ability to perform ADLs and functional mobility tasks. Patient will benefit from skilled physical therapy services to address these deficits to reduce pain and improve level of function with ADLs and functional mobility tasks.   OBJECTIVE IMPAIRMENTS: Abnormal gait, decreased activity tolerance, impaired perceived functional ability, and pain.   ACTIVITY LIMITATIONS: carrying, lifting, and bending  PARTICIPATION LIMITATIONS: shopping, community activity, and yard work  [facility] POTENTIAL: Good  CLINICAL DECISION MAKING: Evolving/moderate complexity  EVALUATION  COMPLEXITY: Moderate   GOALS: Goals reviewed with patient? No  SHORT TERM GOALS: Target date: 09/13/2024  patient will be independent with initial HEP  Baseline: Goal status: INITIAL  LONG TERM GOALS: Target date: 09/27/2024  Patient will be independent in self management strategies to improve quality of life and functional outcomes.  Baseline:  Goal status: INITIAL  2.  Patient will report 50% improvement overall  Baseline:  Goal status: INITIAL   PLAN:  PT FREQUENCY: 1-2x/week  PT DURATION: 4 weeks  PLANNED INTERVENTIONS: 97164- PT Re-evaluation, 97110-Therapeutic exercises, 97530- Therapeutic activity, 97112- Neuromuscular re-education, 97535- Self Care, 02859- Manual therapy, [id number]- Gait training, [phone]- Orthotic Fit/training, [id number]- Canalith repositioning, [id number]- Aquatic Therapy, [phone]- Splinting, [phone]- Wound care (first 20 sq cm), 97598- Wound care (each additional 20 sq cm)Patient/Family education, Balance training, Stair training, Taping, Dry Needling, Joint mobilization, Joint manipulation, Spinal manipulation, Spinal mobilization, Scar mobilization, and DME instructions. SABRA  PLAN FOR NEXT SESSION: Review HEP and goals; dry needle as appropriate combined with lumbar mobility and core strengthening   3:49 PM, 08/30/24 [place] Pettengill Small Harleyquinn Gasser MPT [facility] [place] [phone] [phone]

## 2024-08-30 ENCOUNTER — Other Ambulatory Visit: Payer: Self-pay

## 2024-08-30 ENCOUNTER — Ambulatory Visit (HOSPITAL_COMMUNITY): Attending: Surgery

## 2024-08-30 DIAGNOSIS — M545 Low back pain, unspecified: Secondary | ICD-10-CM | POA: Diagnosis not present

## 2024-08-30 DIAGNOSIS — M533 Sacrococcygeal disorders, not elsewhere classified: Secondary | ICD-10-CM | POA: Diagnosis not present

## 2024-08-30 DIAGNOSIS — M461 Sacroiliitis, not elsewhere classified: Secondary | ICD-10-CM | POA: Diagnosis not present

## 2024-08-30 DIAGNOSIS — R262 Difficulty in walking, not elsewhere classified: Secondary | ICD-10-CM | POA: Diagnosis not present

## 2024-08-31 DIAGNOSIS — Z2911 Encounter for prophylactic immunotherapy for respiratory syncytial virus (RSV): Secondary | ICD-10-CM | POA: Diagnosis not present

## 2024-08-31 DIAGNOSIS — Z23 Encounter for immunization: Secondary | ICD-10-CM | POA: Diagnosis not present

## 2024-08-31 DIAGNOSIS — Z6827 Body mass index (BMI) 27.0-27.9, adult: Secondary | ICD-10-CM | POA: Diagnosis not present

## 2024-08-31 DIAGNOSIS — I10 Essential (primary) hypertension: Secondary | ICD-10-CM | POA: Diagnosis not present

## 2024-09-09 ENCOUNTER — Encounter (HOSPITAL_COMMUNITY)

## 2024-09-17 DIAGNOSIS — E6609 Other obesity due to excess calories: Secondary | ICD-10-CM | POA: Diagnosis not present

## 2024-09-17 DIAGNOSIS — Z9181 History of falling: Secondary | ICD-10-CM | POA: Diagnosis not present

## 2024-09-17 DIAGNOSIS — Z79899 Other long term (current) drug therapy: Secondary | ICD-10-CM | POA: Diagnosis not present

## 2024-09-17 DIAGNOSIS — M62830 Muscle spasm of back: Secondary | ICD-10-CM | POA: Diagnosis not present

## 2024-09-17 DIAGNOSIS — M4716 Other spondylosis with myelopathy, lumbar region: Secondary | ICD-10-CM | POA: Diagnosis not present

## 2024-09-17 DIAGNOSIS — I10 Essential (primary) hypertension: Secondary | ICD-10-CM | POA: Diagnosis not present

## 2024-09-17 DIAGNOSIS — R7303 Prediabetes: Secondary | ICD-10-CM | POA: Diagnosis not present

## 2024-09-29 ENCOUNTER — Encounter (HOSPITAL_COMMUNITY)

## 2024-09-30 DIAGNOSIS — R7303 Prediabetes: Secondary | ICD-10-CM | POA: Diagnosis not present

## 2024-09-30 DIAGNOSIS — E1165 Type 2 diabetes mellitus with hyperglycemia: Secondary | ICD-10-CM | POA: Diagnosis not present

## 2024-09-30 DIAGNOSIS — I10 Essential (primary) hypertension: Secondary | ICD-10-CM | POA: Diagnosis not present

## 2024-09-30 DIAGNOSIS — Z0001 Encounter for general adult medical examination with abnormal findings: Secondary | ICD-10-CM | POA: Diagnosis not present

## 2024-09-30 DIAGNOSIS — E039 Hypothyroidism, unspecified: Secondary | ICD-10-CM | POA: Diagnosis not present

## 2024-09-30 DIAGNOSIS — R739 Hyperglycemia, unspecified: Secondary | ICD-10-CM | POA: Diagnosis not present

## 2024-09-30 DIAGNOSIS — E7849 Other hyperlipidemia: Secondary | ICD-10-CM | POA: Diagnosis not present

## 2024-10-01 ENCOUNTER — Encounter (HOSPITAL_COMMUNITY)

## 2024-10-05 ENCOUNTER — Encounter (HOSPITAL_COMMUNITY)

## 2024-10-11 ENCOUNTER — Encounter (HOSPITAL_COMMUNITY)

## 2024-10-13 DIAGNOSIS — Z0001 Encounter for general adult medical examination with abnormal findings: Secondary | ICD-10-CM | POA: Diagnosis not present

## 2024-10-13 DIAGNOSIS — R4582 Worries: Secondary | ICD-10-CM | POA: Diagnosis not present

## 2024-10-13 DIAGNOSIS — Z1331 Encounter for screening for depression: Secondary | ICD-10-CM | POA: Diagnosis not present

## 2024-10-13 DIAGNOSIS — I10 Essential (primary) hypertension: Secondary | ICD-10-CM | POA: Diagnosis not present

## 2024-10-13 DIAGNOSIS — F419 Anxiety disorder, unspecified: Secondary | ICD-10-CM | POA: Diagnosis not present

## 2024-10-13 DIAGNOSIS — Z6827 Body mass index (BMI) 27.0-27.9, adult: Secondary | ICD-10-CM | POA: Diagnosis not present

## 2024-10-14 ENCOUNTER — Encounter (HOSPITAL_COMMUNITY)

## 2024-10-14 DIAGNOSIS — Z1331 Encounter for screening for depression: Secondary | ICD-10-CM | POA: Diagnosis not present

## 2024-10-14 DIAGNOSIS — Z0001 Encounter for general adult medical examination with abnormal findings: Secondary | ICD-10-CM | POA: Diagnosis not present

## 2024-10-14 DIAGNOSIS — Z1389 Encounter for screening for other disorder: Secondary | ICD-10-CM | POA: Diagnosis not present

## 2024-10-14 DIAGNOSIS — Z6827 Body mass index (BMI) 27.0-27.9, adult: Secondary | ICD-10-CM | POA: Diagnosis not present

## 2024-10-15 DIAGNOSIS — Z6841 Body Mass Index (BMI) 40.0 and over, adult: Secondary | ICD-10-CM | POA: Diagnosis not present

## 2024-10-15 DIAGNOSIS — M62830 Muscle spasm of back: Secondary | ICD-10-CM | POA: Diagnosis not present

## 2024-10-15 DIAGNOSIS — Z79899 Other long term (current) drug therapy: Secondary | ICD-10-CM | POA: Diagnosis not present

## 2024-10-15 DIAGNOSIS — E6609 Other obesity due to excess calories: Secondary | ICD-10-CM | POA: Diagnosis not present

## 2024-10-15 DIAGNOSIS — M4716 Other spondylosis with myelopathy, lumbar region: Secondary | ICD-10-CM | POA: Diagnosis not present

## 2024-10-18 ENCOUNTER — Encounter (HOSPITAL_COMMUNITY)

## 2024-10-18 DIAGNOSIS — Z79899 Other long term (current) drug therapy: Secondary | ICD-10-CM | POA: Diagnosis not present

## 2024-10-21 ENCOUNTER — Encounter (HOSPITAL_COMMUNITY)

## 2024-10-25 ENCOUNTER — Encounter (HOSPITAL_COMMUNITY)

## 2024-10-27 ENCOUNTER — Encounter (HOSPITAL_COMMUNITY)

## 2024-11-04 DIAGNOSIS — Z6827 Body mass index (BMI) 27.0-27.9, adult: Secondary | ICD-10-CM | POA: Diagnosis not present

## 2024-11-04 DIAGNOSIS — R3915 Urgency of urination: Secondary | ICD-10-CM | POA: Diagnosis not present

## 2024-11-04 DIAGNOSIS — R350 Frequency of micturition: Secondary | ICD-10-CM | POA: Diagnosis not present

## 2024-11-04 DIAGNOSIS — R300 Dysuria: Secondary | ICD-10-CM | POA: Diagnosis not present

## 2024-11-05 DIAGNOSIS — H25812 Combined forms of age-related cataract, left eye: Secondary | ICD-10-CM | POA: Diagnosis not present

## 2024-11-05 DIAGNOSIS — H25811 Combined forms of age-related cataract, right eye: Secondary | ICD-10-CM | POA: Diagnosis not present

## 2024-11-10 DIAGNOSIS — R300 Dysuria: Secondary | ICD-10-CM | POA: Diagnosis not present

## 2024-11-15 DIAGNOSIS — Z9181 History of falling: Secondary | ICD-10-CM | POA: Diagnosis not present

## 2024-11-15 DIAGNOSIS — R7303 Prediabetes: Secondary | ICD-10-CM | POA: Diagnosis not present

## 2024-11-15 DIAGNOSIS — M62830 Muscle spasm of back: Secondary | ICD-10-CM | POA: Diagnosis not present

## 2024-11-15 DIAGNOSIS — I10 Essential (primary) hypertension: Secondary | ICD-10-CM | POA: Diagnosis not present

## 2024-11-15 DIAGNOSIS — M4716 Other spondylosis with myelopathy, lumbar region: Secondary | ICD-10-CM | POA: Diagnosis not present

## 2024-11-15 DIAGNOSIS — Z79899 Other long term (current) drug therapy: Secondary | ICD-10-CM | POA: Diagnosis not present

## 2024-11-15 DIAGNOSIS — E6609 Other obesity due to excess calories: Secondary | ICD-10-CM | POA: Diagnosis not present

## 2024-12-21 ENCOUNTER — Encounter (INDEPENDENT_AMBULATORY_CARE_PROVIDER_SITE_OTHER): Payer: Self-pay | Admitting: *Deleted
